# Patient Record
Sex: Male | Born: 1955 | Race: White | Hispanic: No | Smoking: Never smoker
Health system: Southern US, Academic
[De-identification: ages and names within clinical notes are randomized; demographics above are authoritative.]

## PROBLEM LIST (undated history)

## (undated) DIAGNOSIS — E785 Hyperlipidemia, unspecified: Secondary | ICD-10-CM

## (undated) DIAGNOSIS — E119 Type 2 diabetes mellitus without complications: Secondary | ICD-10-CM

## (undated) DIAGNOSIS — Z6825 Body mass index (BMI) 25.0-25.9, adult: Secondary | ICD-10-CM

## (undated) DIAGNOSIS — M539 Dorsopathy, unspecified: Secondary | ICD-10-CM

## (undated) DIAGNOSIS — E7800 Pure hypercholesterolemia, unspecified: Secondary | ICD-10-CM

## (undated) DIAGNOSIS — Z8669 Personal history of other diseases of the nervous system and sense organs: Secondary | ICD-10-CM

## (undated) DIAGNOSIS — Z6827 Body mass index (BMI) 27.0-27.9, adult: Secondary | ICD-10-CM

## (undated) DIAGNOSIS — G629 Polyneuropathy, unspecified: Secondary | ICD-10-CM

## (undated) DIAGNOSIS — R200 Anesthesia of skin: Secondary | ICD-10-CM

## (undated) DIAGNOSIS — I739 Peripheral vascular disease, unspecified: Secondary | ICD-10-CM

## (undated) DIAGNOSIS — G8929 Other chronic pain: Secondary | ICD-10-CM

## (undated) DIAGNOSIS — K219 Gastro-esophageal reflux disease without esophagitis: Secondary | ICD-10-CM

## (undated) DIAGNOSIS — T148XXA Other injury of unspecified body region, initial encounter: Secondary | ICD-10-CM

## (undated) DIAGNOSIS — I2510 Atherosclerotic heart disease of native coronary artery without angina pectoris: Secondary | ICD-10-CM

## (undated) DIAGNOSIS — Z973 Presence of spectacles and contact lenses: Secondary | ICD-10-CM

## (undated) DIAGNOSIS — M4803 Spinal stenosis, cervicothoracic region: Secondary | ICD-10-CM

## (undated) DIAGNOSIS — I10 Essential (primary) hypertension: Secondary | ICD-10-CM

## (undated) HISTORY — PX: HX KNEE SURGERY: 2100001320

## (undated) HISTORY — PX: HX TONSILLECTOMY: SHX27

## (undated) HISTORY — PX: COLONOSCOPY: SHX174

## (undated) HISTORY — PX: TOE AMPUTATION: SHX809

## (undated) HISTORY — PX: HX OTHER: 2100001105

## (undated) HISTORY — PX: HX HEART CATHETERIZATION: SHX148

## (undated) HISTORY — PX: HX BACK SURGERY: SHX140

## (undated) HISTORY — DX: Peripheral vascular disease, unspecified: I73.9

## (undated) HISTORY — PX: HX UPPER ENDOSCOPY: 2100001144

---

## 1995-12-08 ENCOUNTER — Emergency Department (HOSPITAL_COMMUNITY): Payer: Self-pay

## 2020-09-06 IMAGING — CR XRAY SINUSES COMPLETE
1 series · 4 of 4 positions shown · non-contrast
Comparison: None available.

﻿EXAM:  45115 - X-RAY EXAM OF SINUSES
INDICATION: Chronic sinusitis.

[Series 1: view not recorded · 0.17mm/px · 4 of 4 slices shown]
[im 1/4]
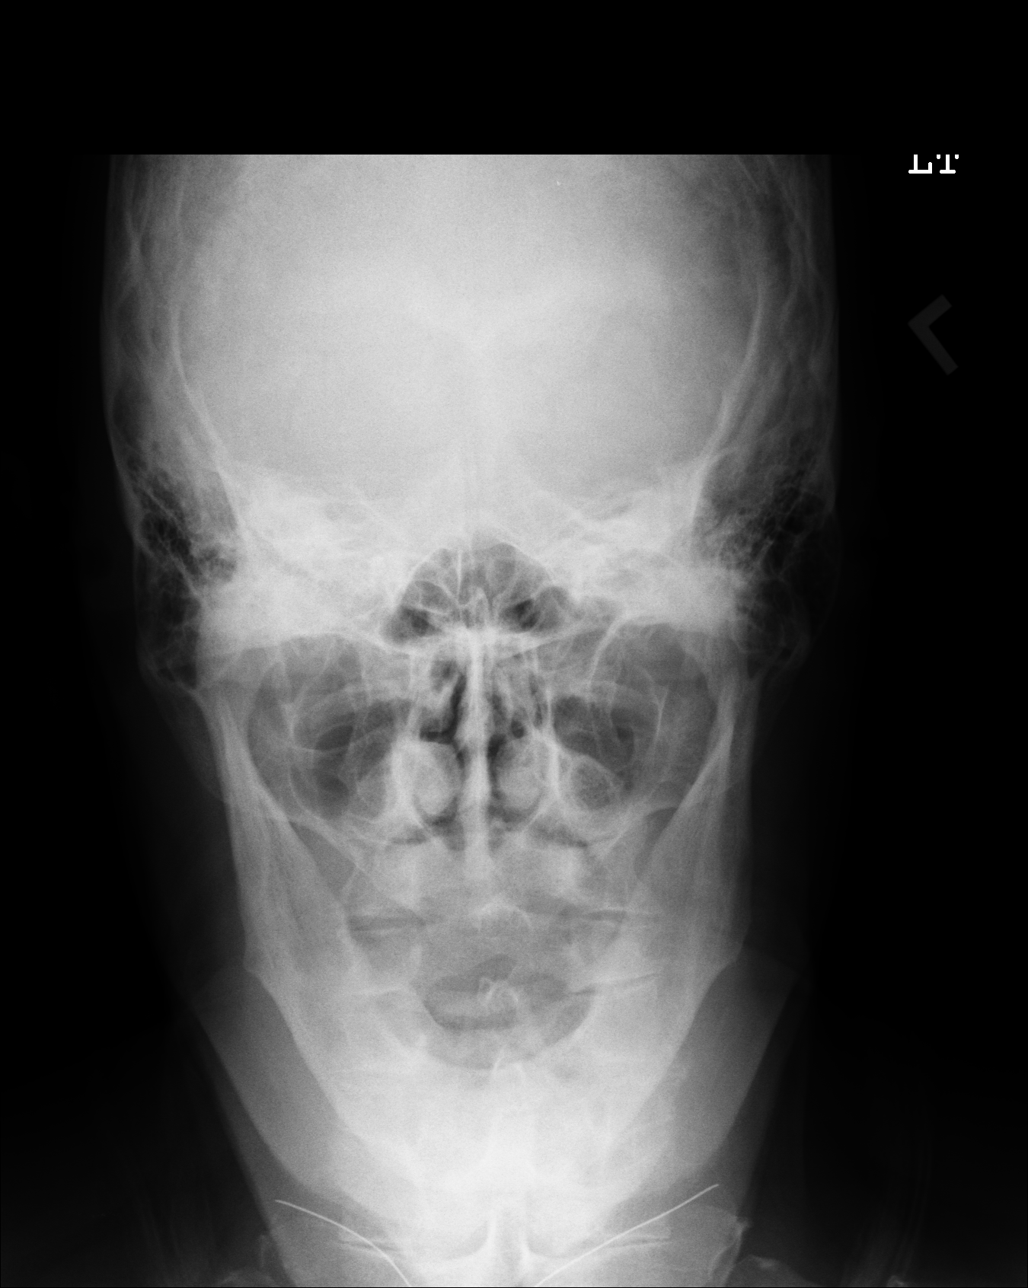
[im 2/4]
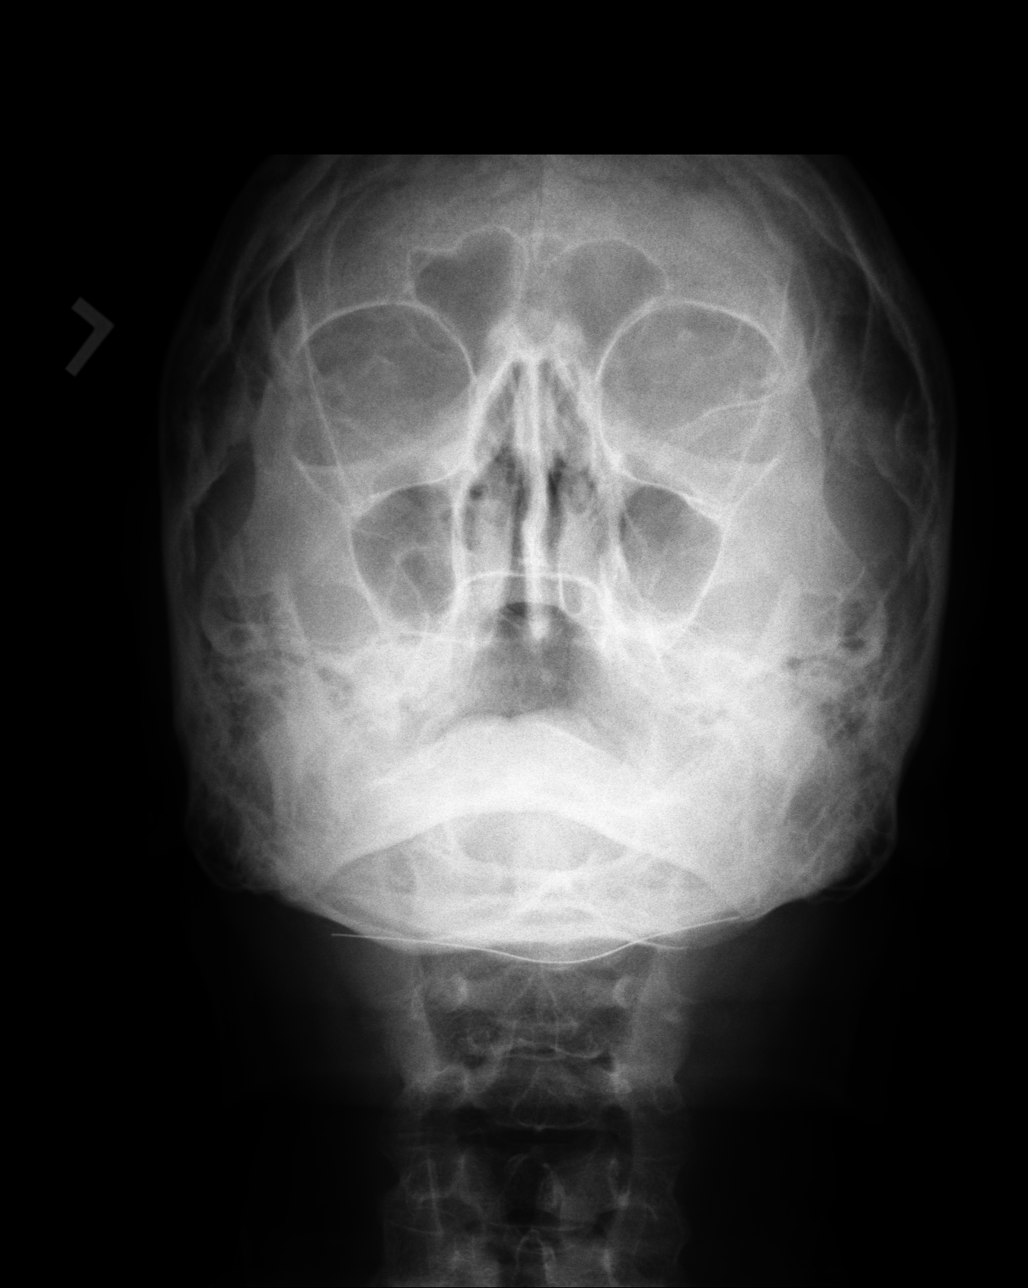
[im 3/4]
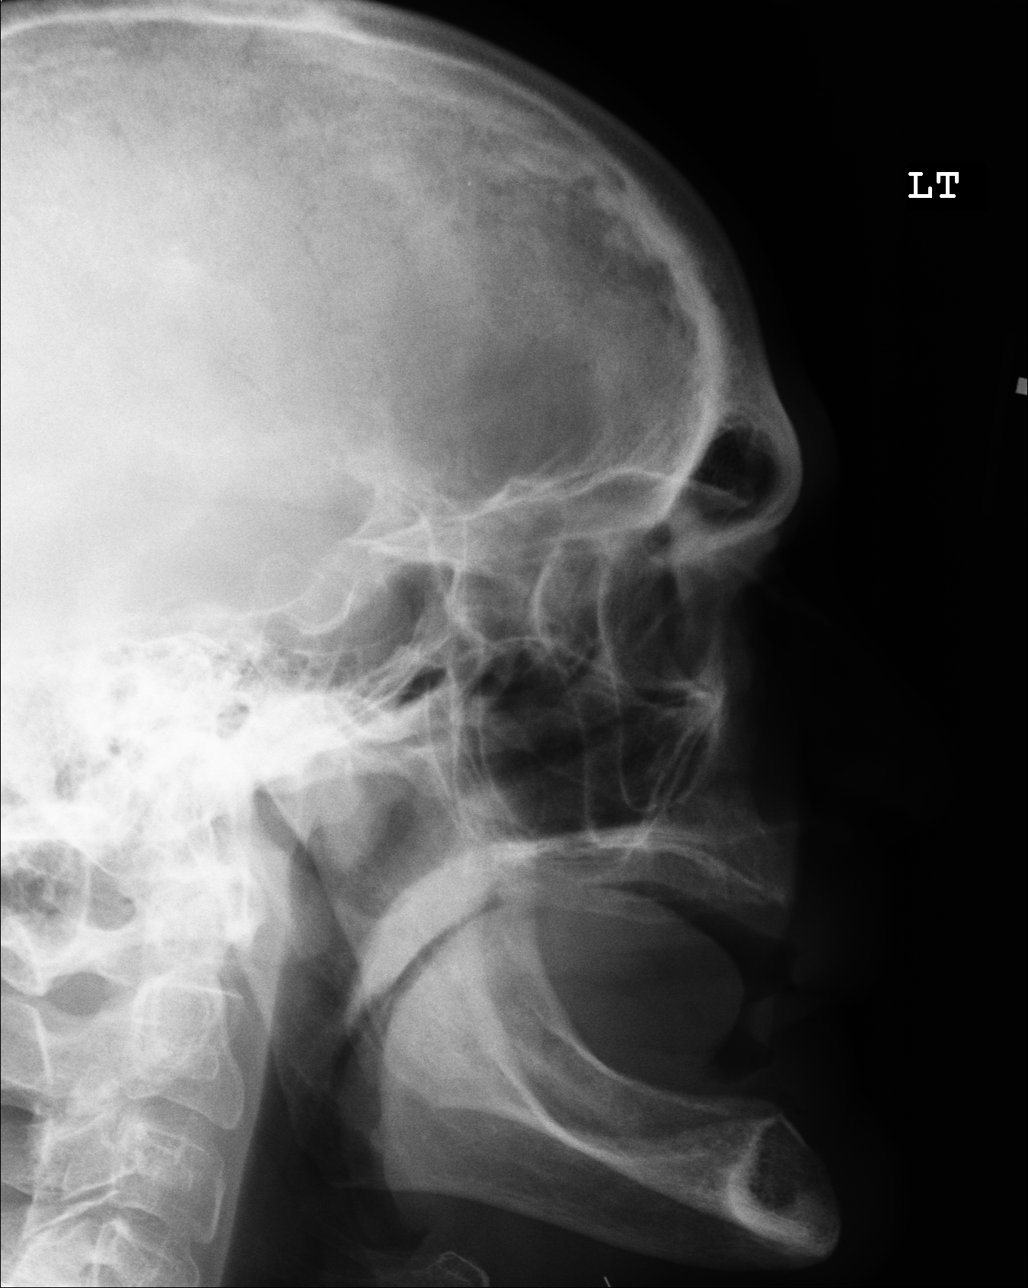
[im 4/4]
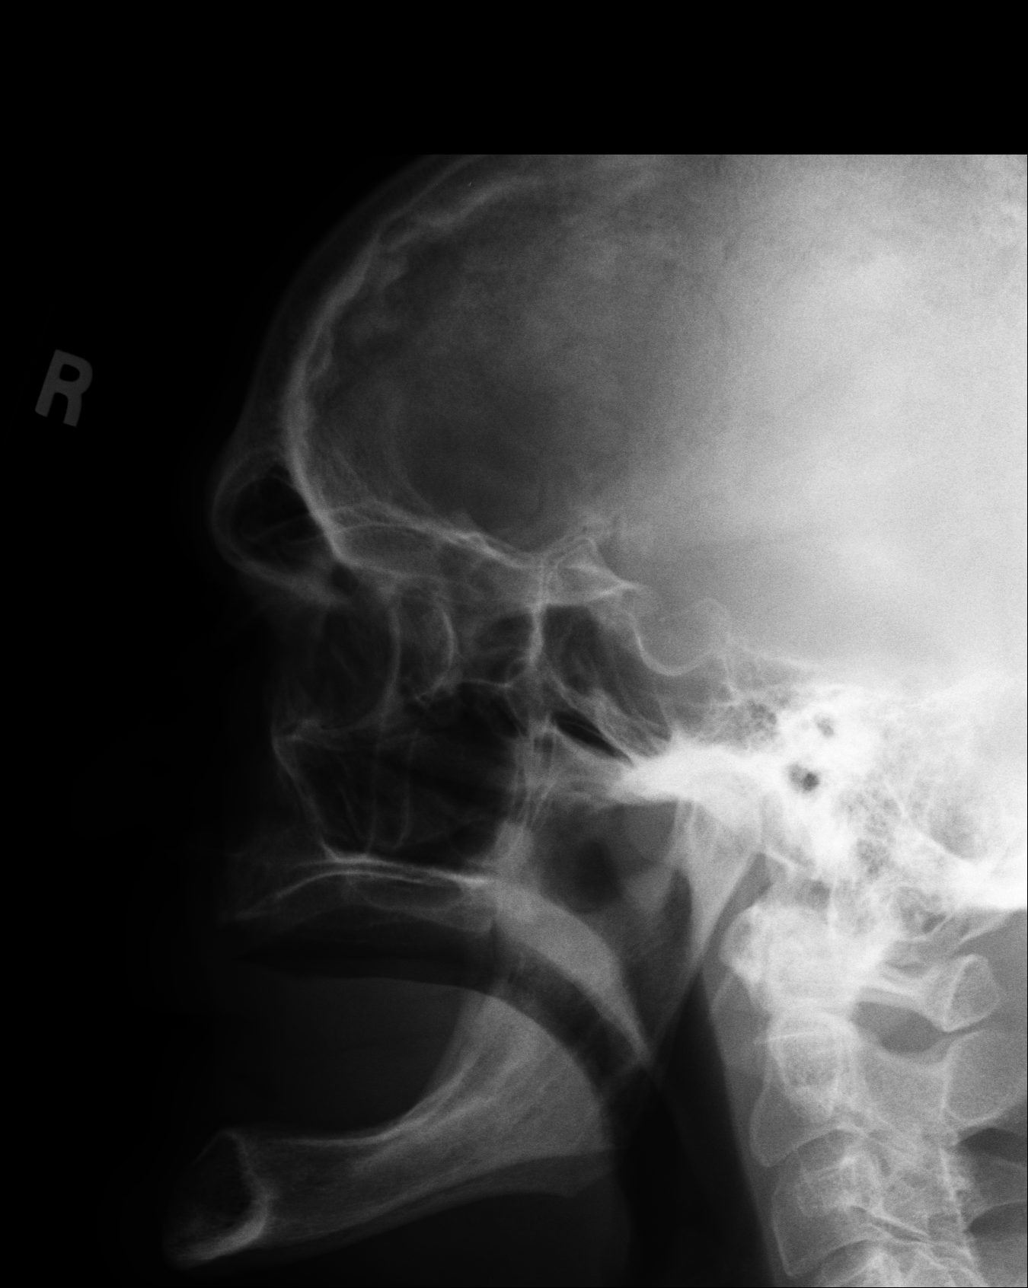

[4 of 4 positions shown; findings below may reference images not displayed]

FINDINGS: Frontal, ethmoid and maxillary sinuses are well aerated. Nasal septum is midline.
IMPRESSION: Unremarkable exam.

## 2020-10-05 IMAGING — CR XRAY LUMBAR SPINE [DATE] VIEWS
1 series · 3 of 3 positions shown · non-contrast
Comparison: None.

﻿EXAM:  10766   XRAY LUMBAR SPINE [DATE] VIEWS
INDICATION: Right-sided low back pain.

[Series 1: view not recorded · 0.17mm/px · 3 of 3 slices shown]
[im 1/3]
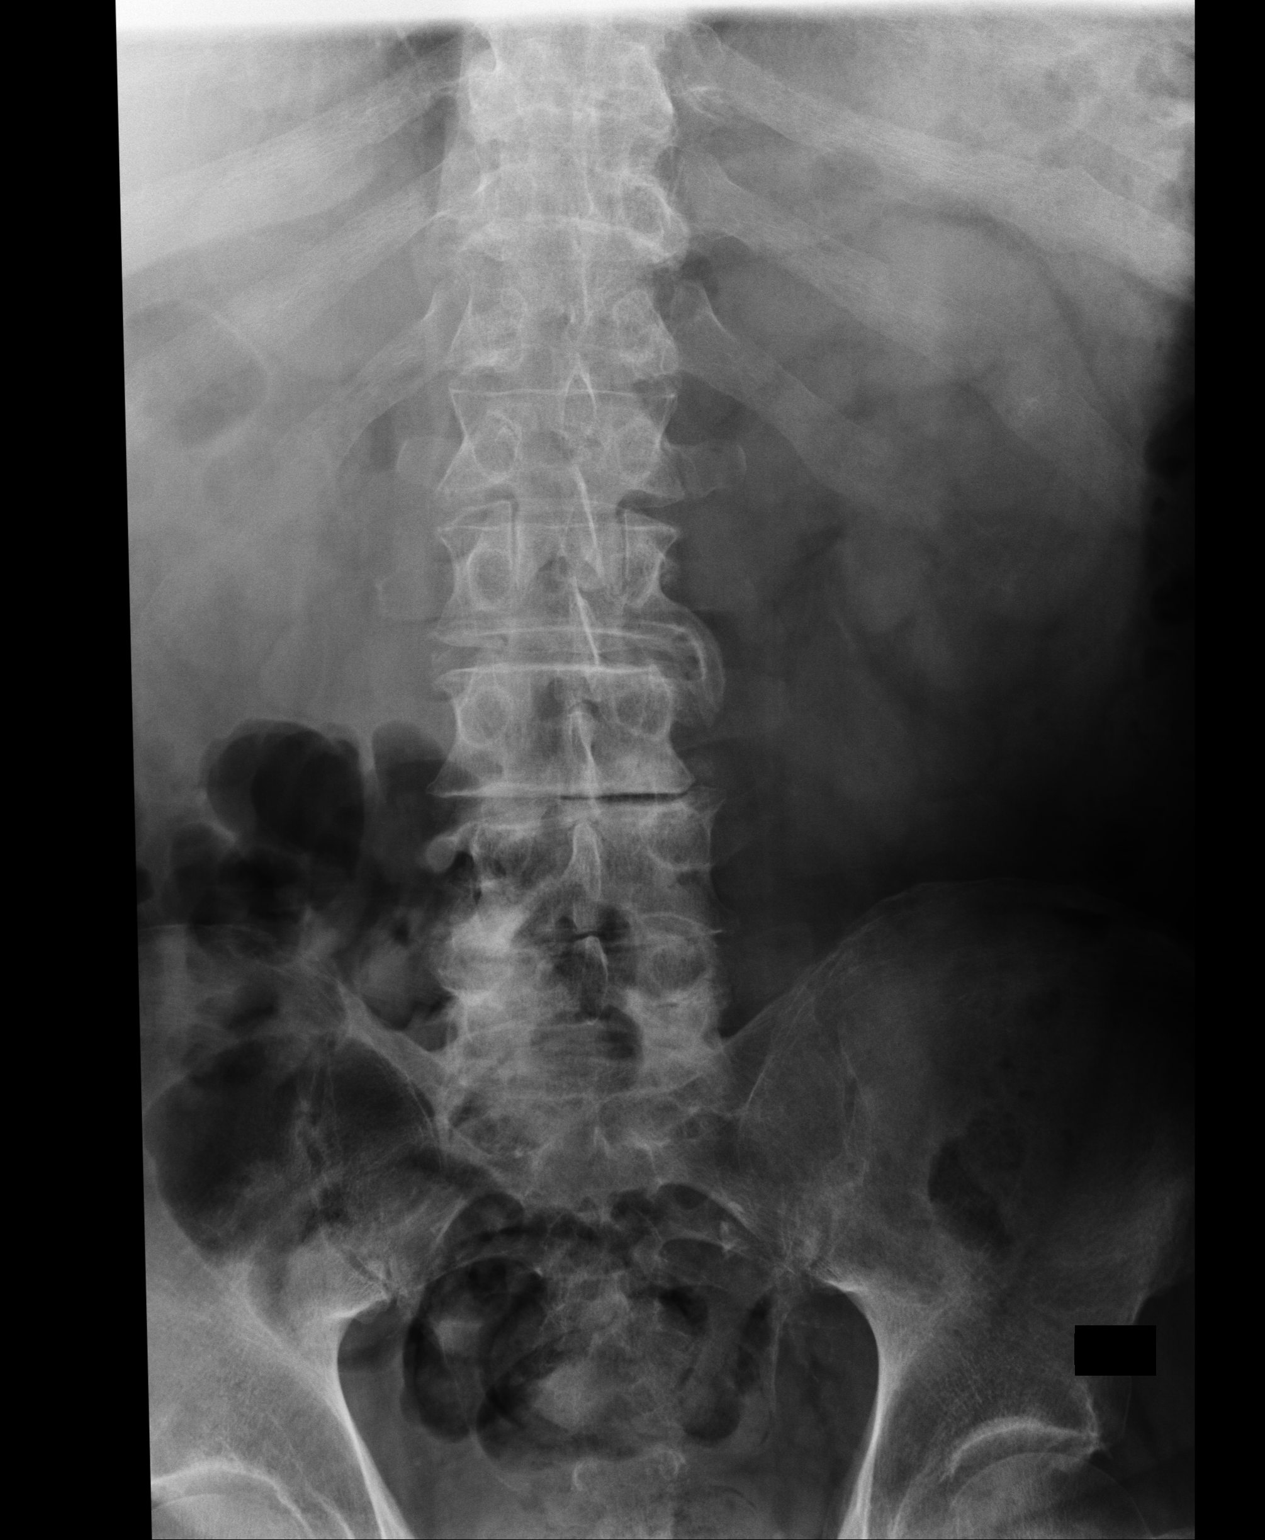
[im 2/3]
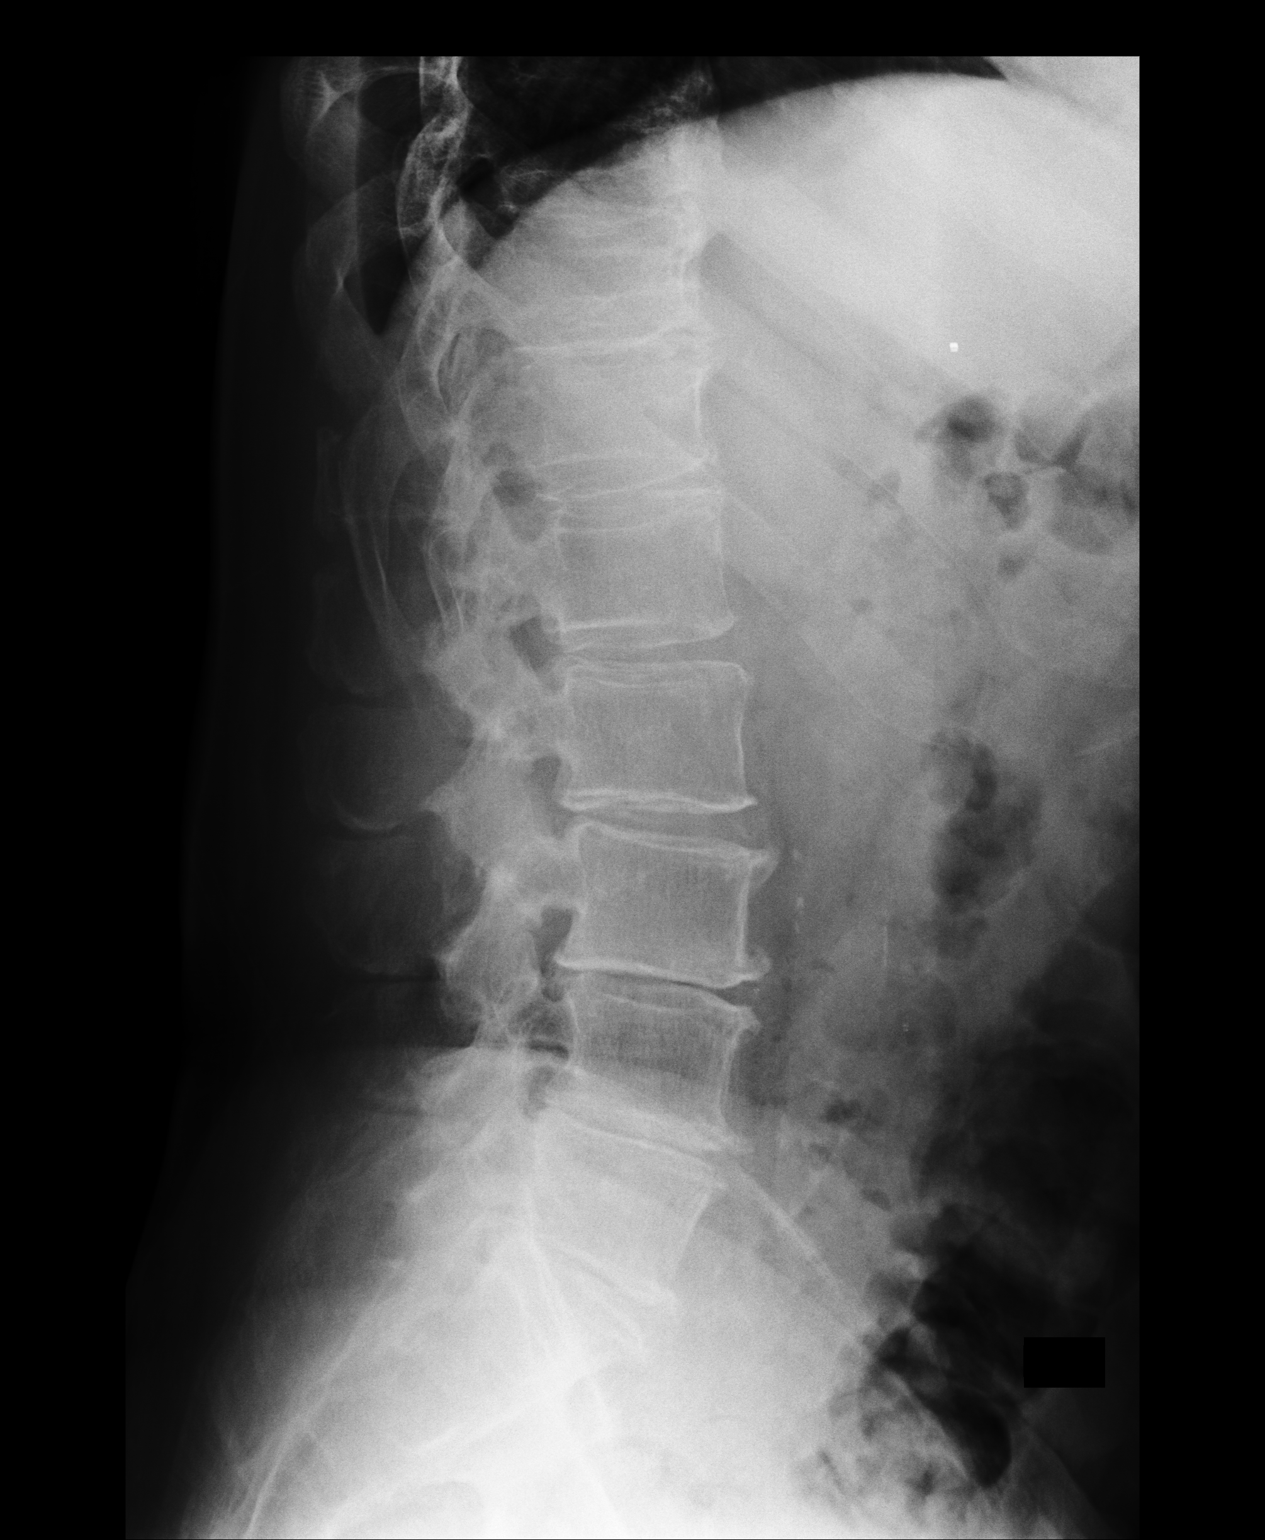
[im 3/3]
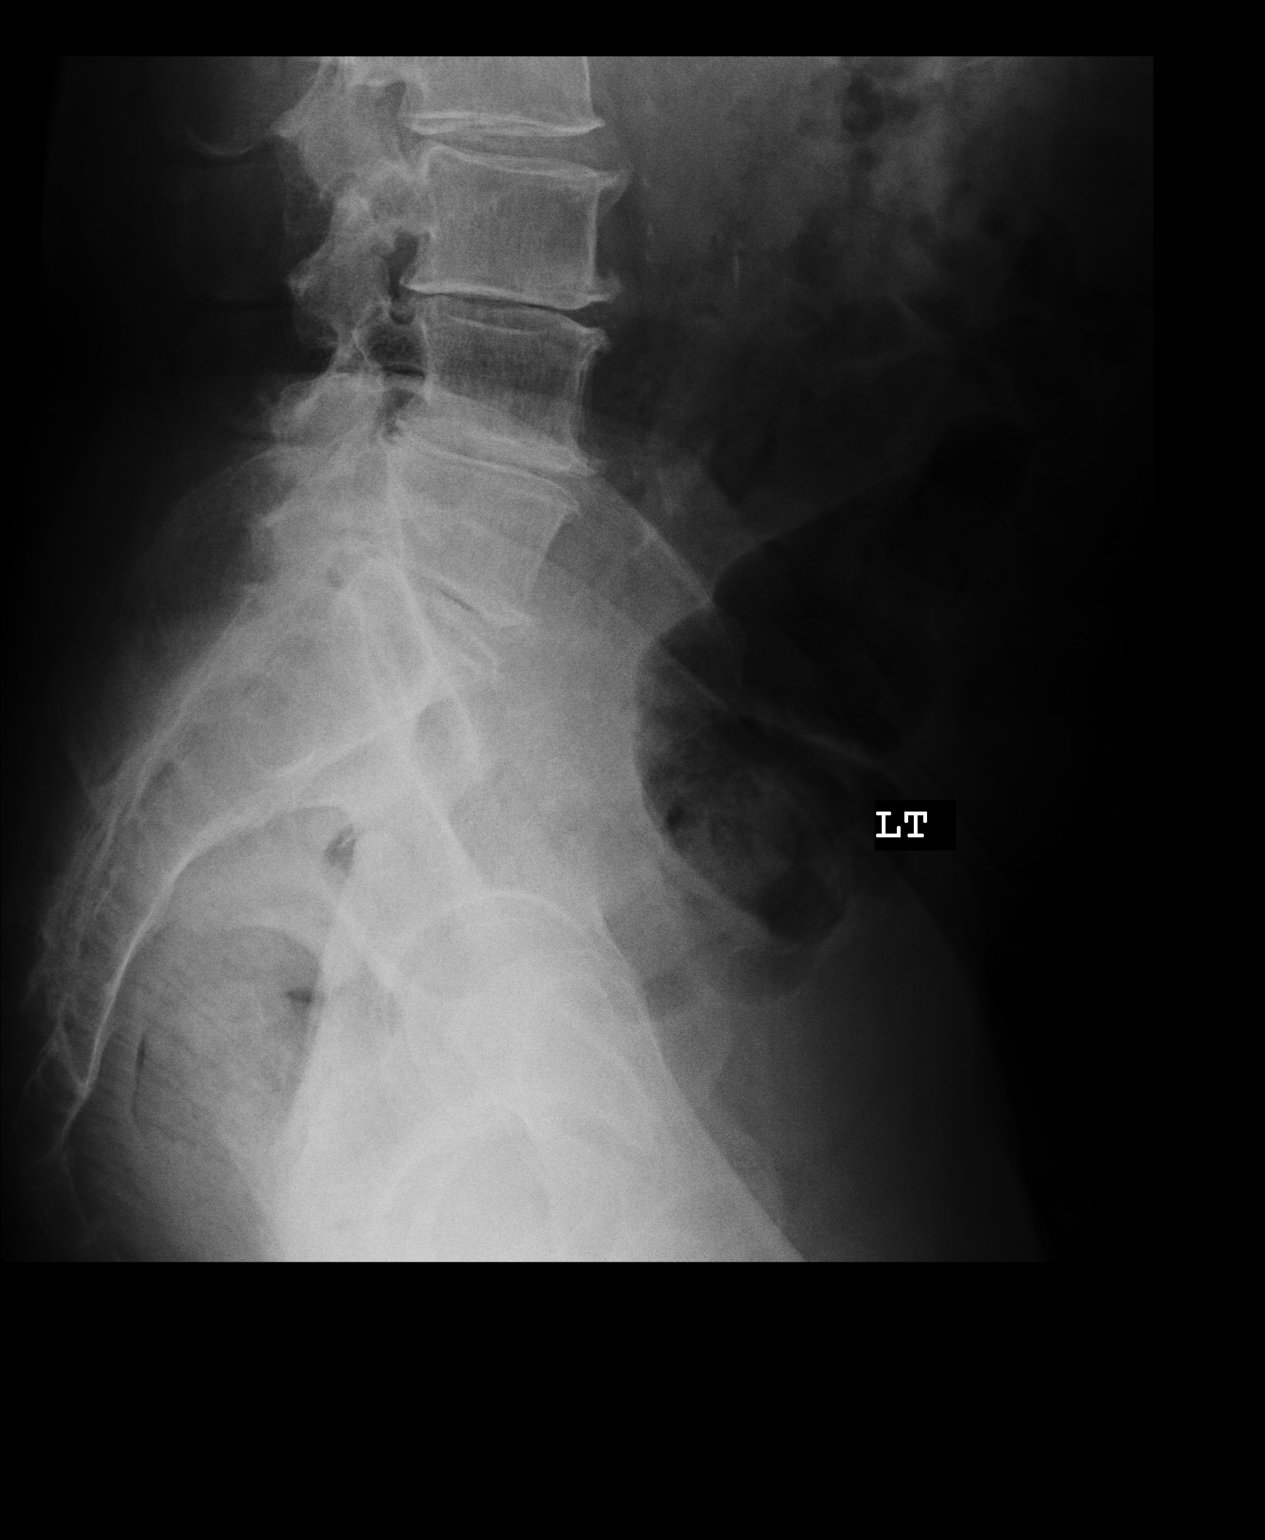

[3 of 3 positions shown; findings below may reference images not displayed]

FINDINGS: Three views demonstrate moderately severe degenerative changes at multiple levels with disc space narrowing, osteophyte formation, endplate sclerosis, and vacuum disc phenomena.  There is no fracture, malalignment, or lytic or blastic bony lesion.  Vascular calcification is noted.
IMPRESSION: Moderately severe degenerative changes.

## 2021-01-24 IMAGING — CR XRAY THORACIC SPINE 2 VIEWS
1 series · 3 of 3 positions shown · non-contrast
Comparison: None available.

﻿EXAM:  01101      XRAY THORACIC SPINE 2 VIEWS
INDICATION: Back pain.

[Series 1: view not recorded · 0.17mm/px · 3 of 3 slices shown]
[im 1/3]
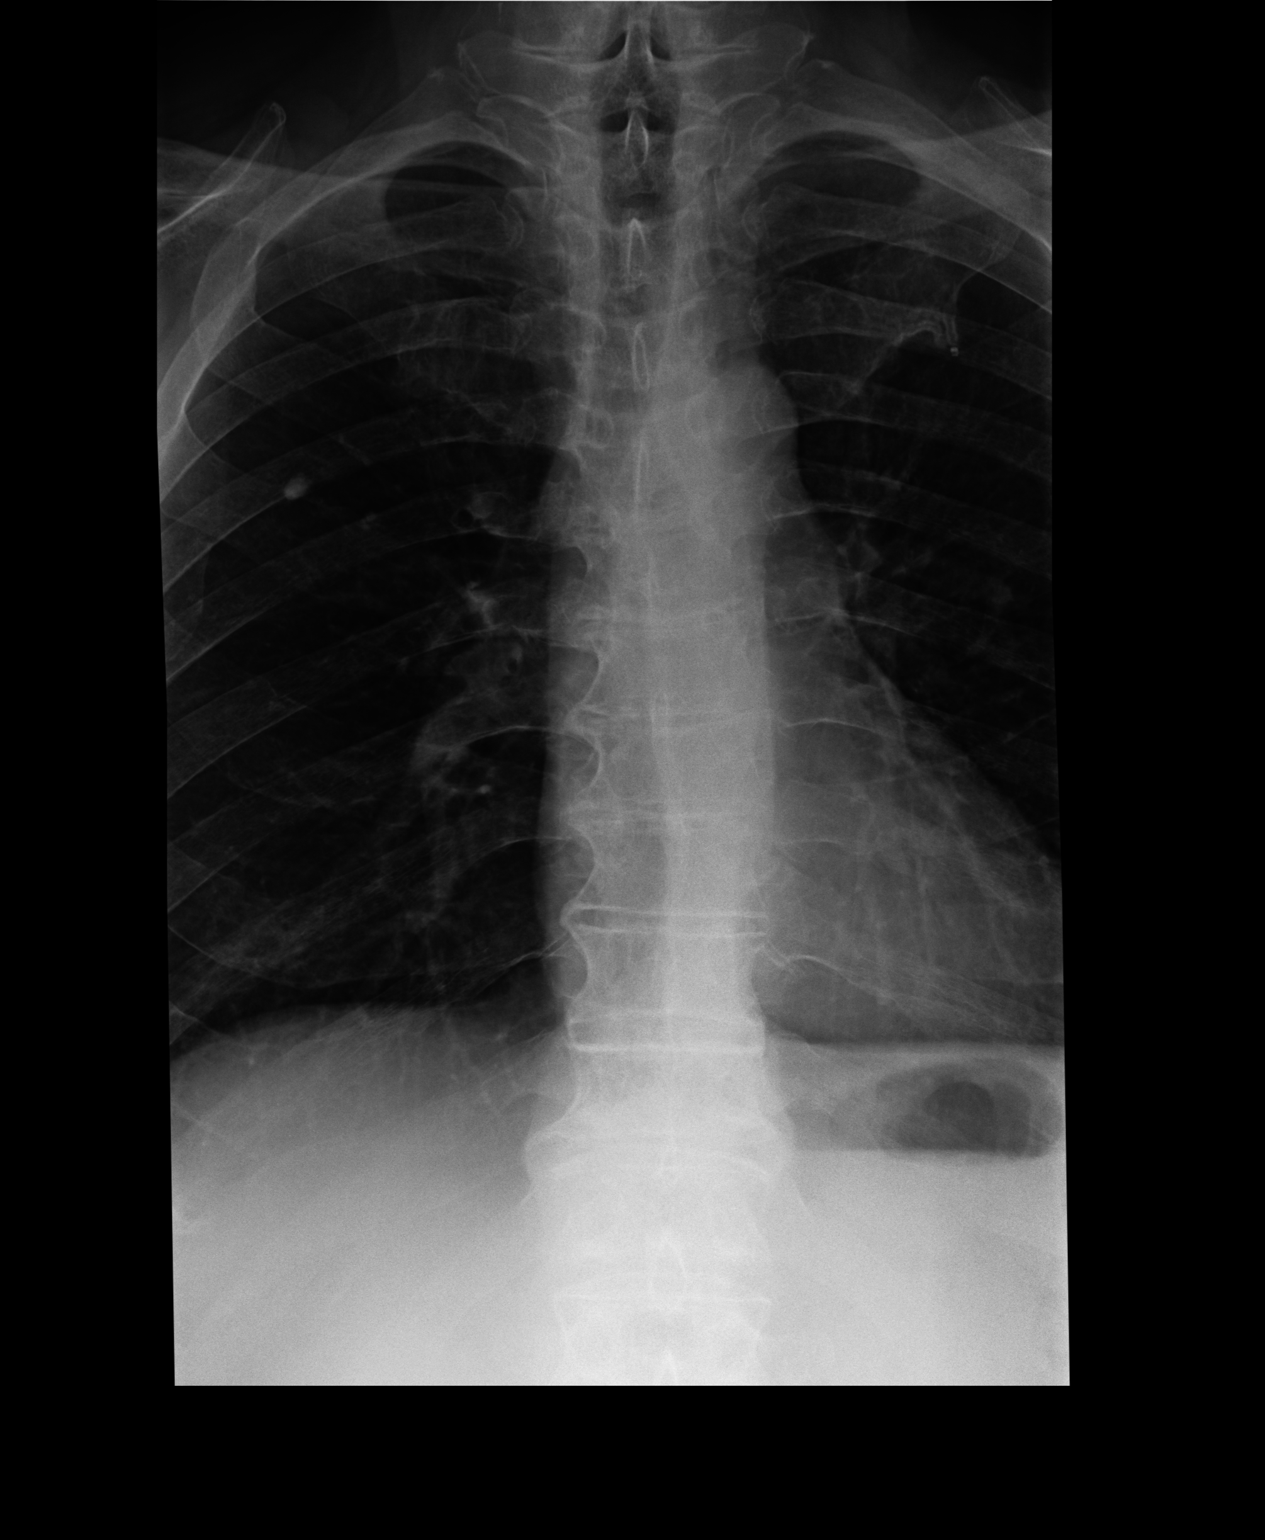
[im 2/3]
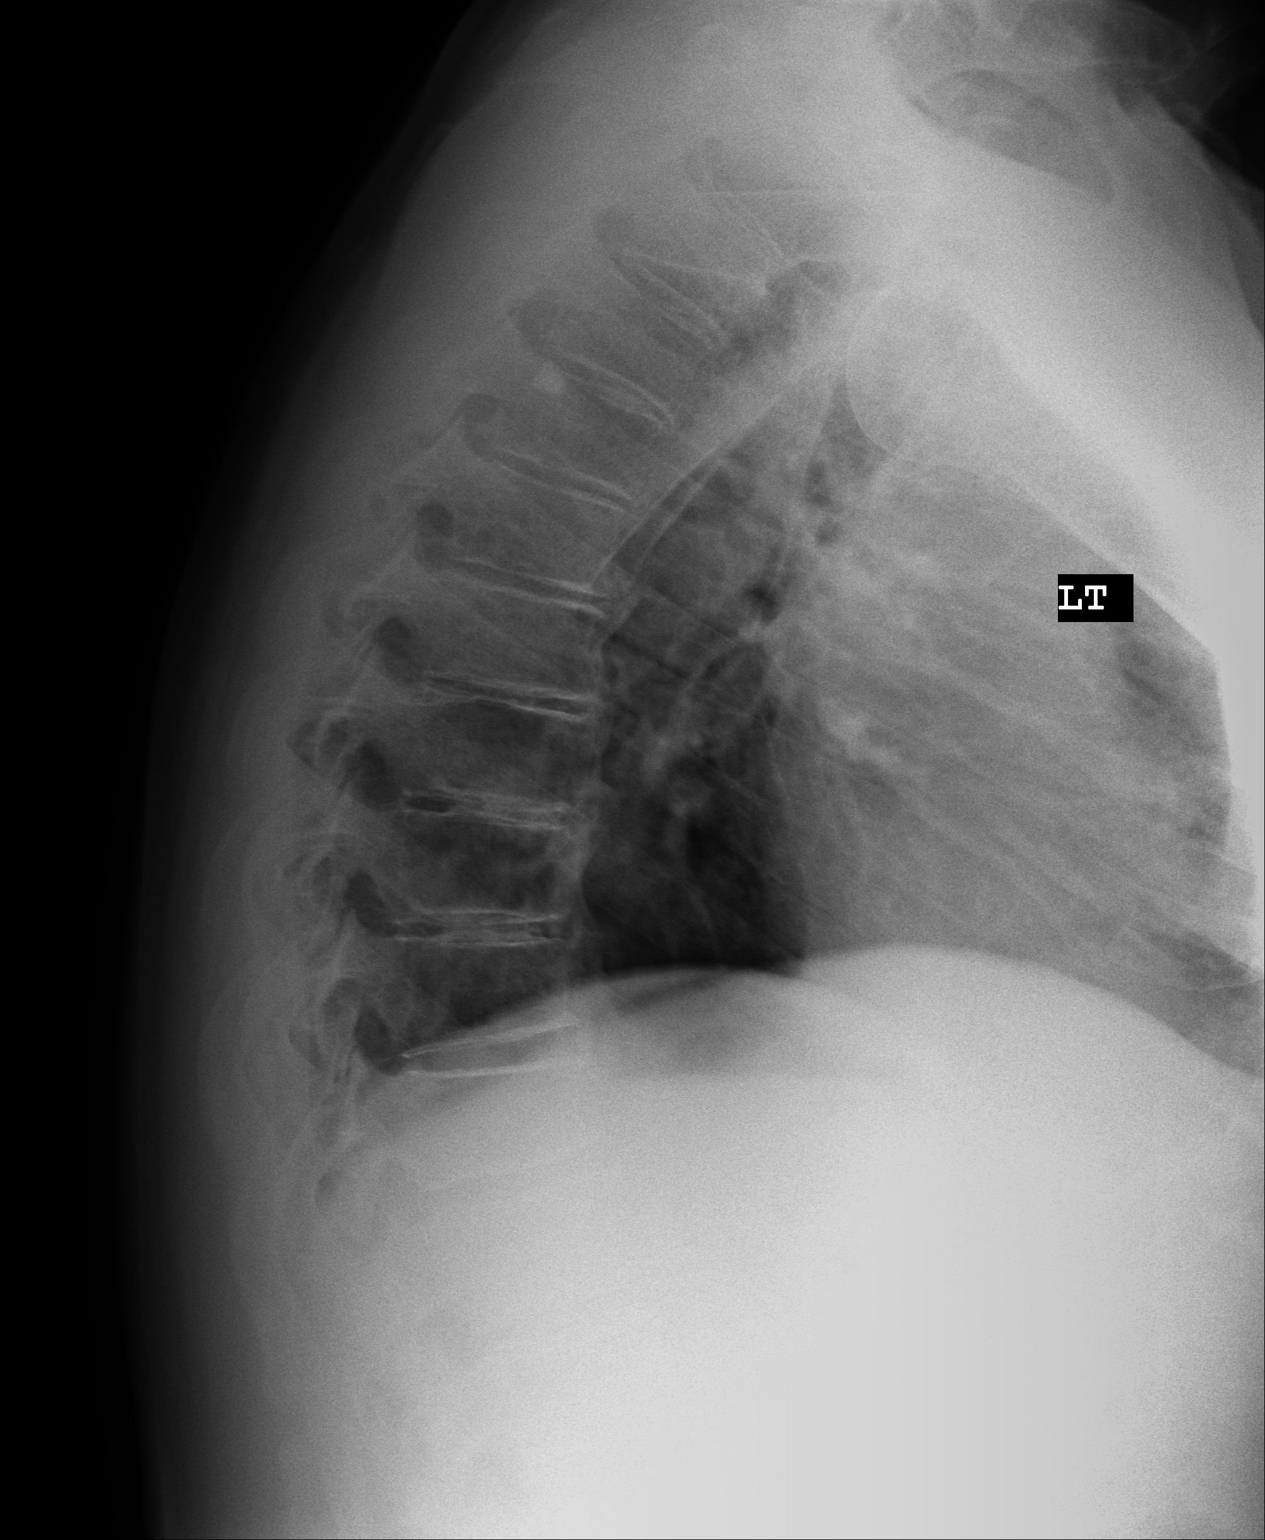
[im 3/3]
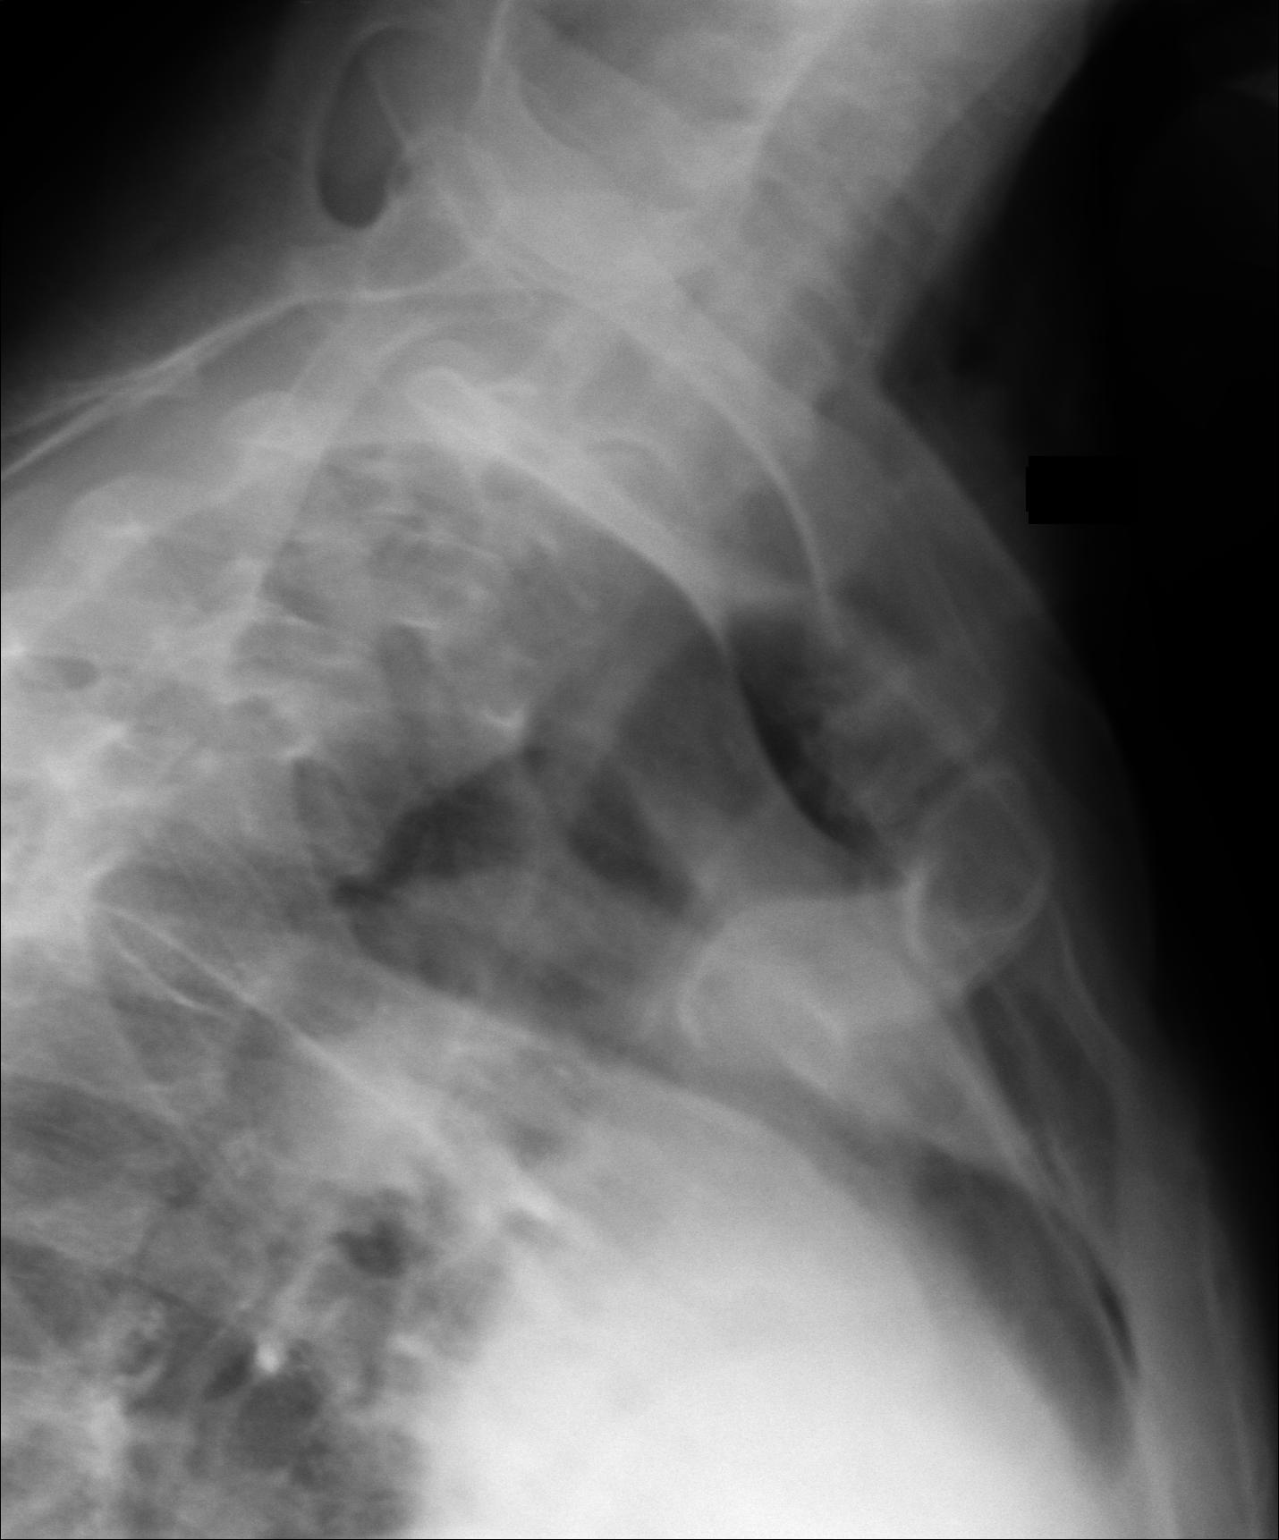

[3 of 3 positions shown; findings below may reference images not displayed]

FINDINGS: There is a mild chronic T11 superior endplate compression fracture. There is no acute fracture or subluxation. Mild degenerative disc disease seen at a few levels within the mid and lower thoracic spine. Paraspinal soft tissues are unremarkable.
IMPRESSION: Mild chronic T11 superior endplate compression fracture and multilevel degenerative changes.

## 2021-01-24 IMAGING — CR XRAY CERVICAL SPINE [DATE] VIEWS
1 series · 3 of 3 positions shown · non-contrast
Comparison: None available.

﻿EXAM:  [id number]      XRAY CERVICAL SPINE [DATE] VIEWS
INDICATION: Neck pain.

[Series 1: view not recorded · 0.17mm/px · 3 of 3 slices shown]
[im 1/3]
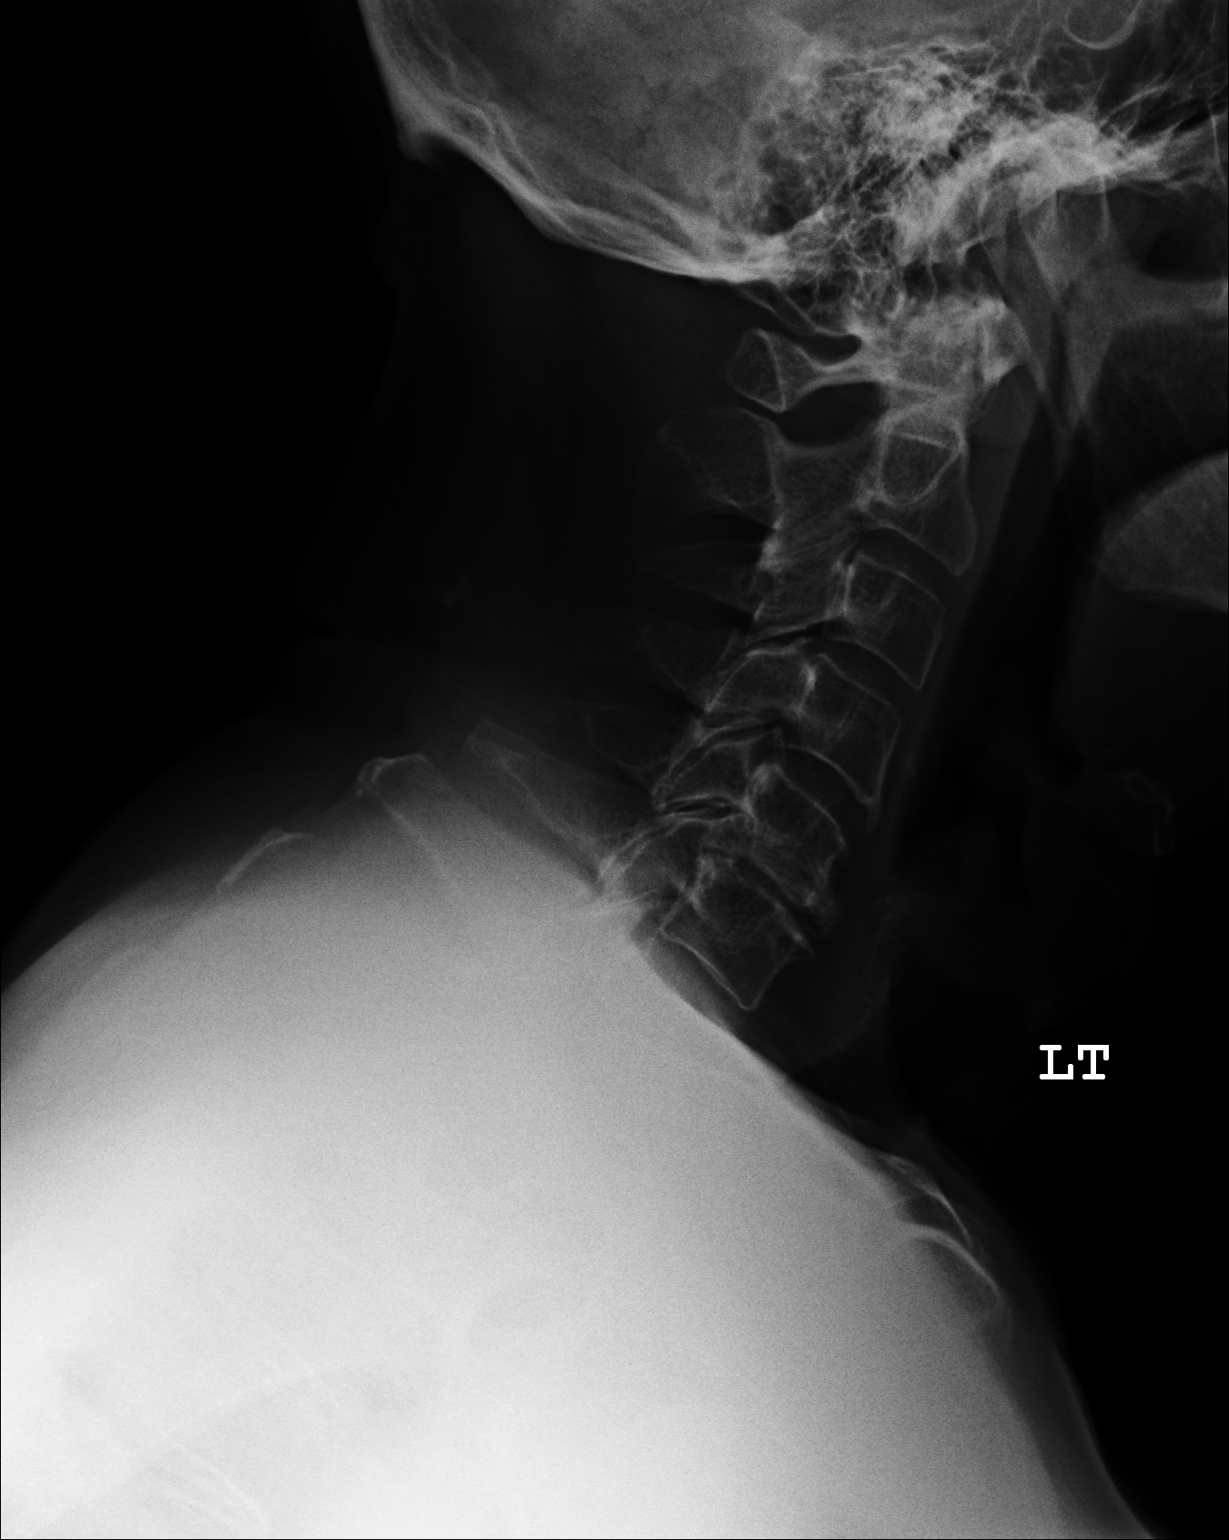
[im 2/3]
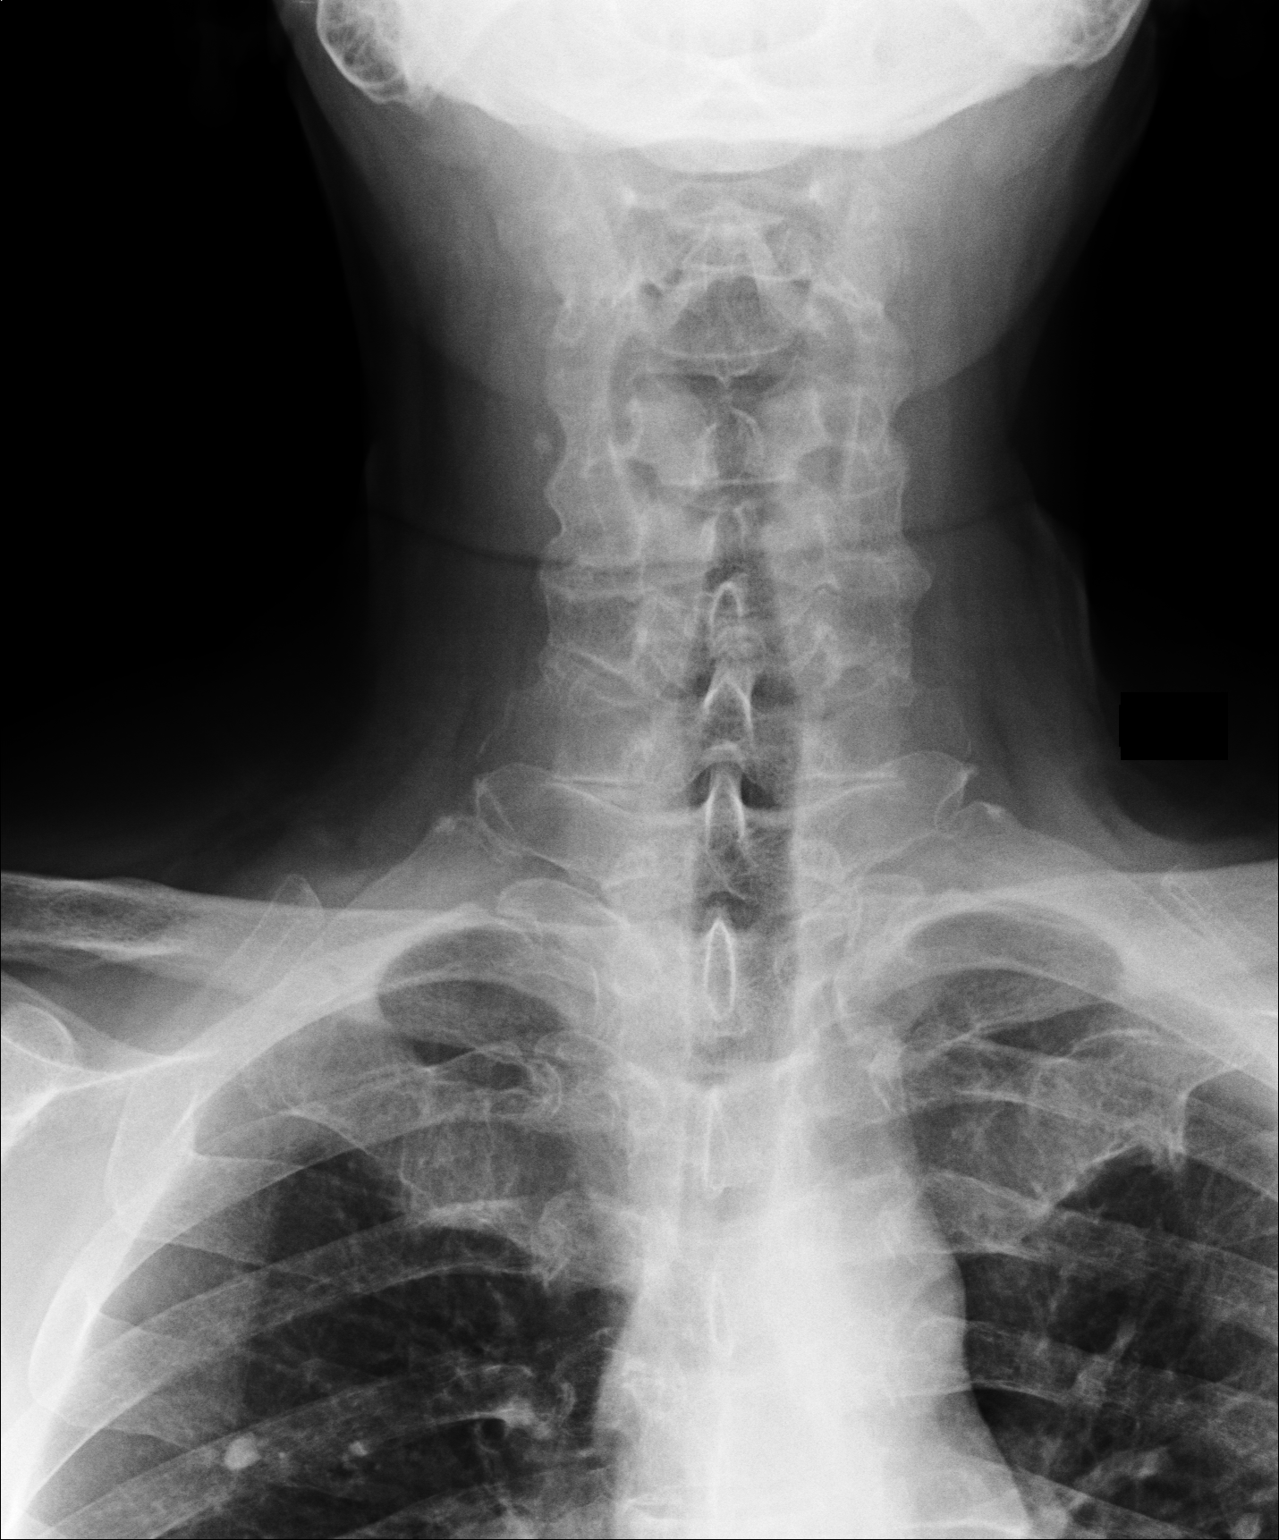
[im 3/3]
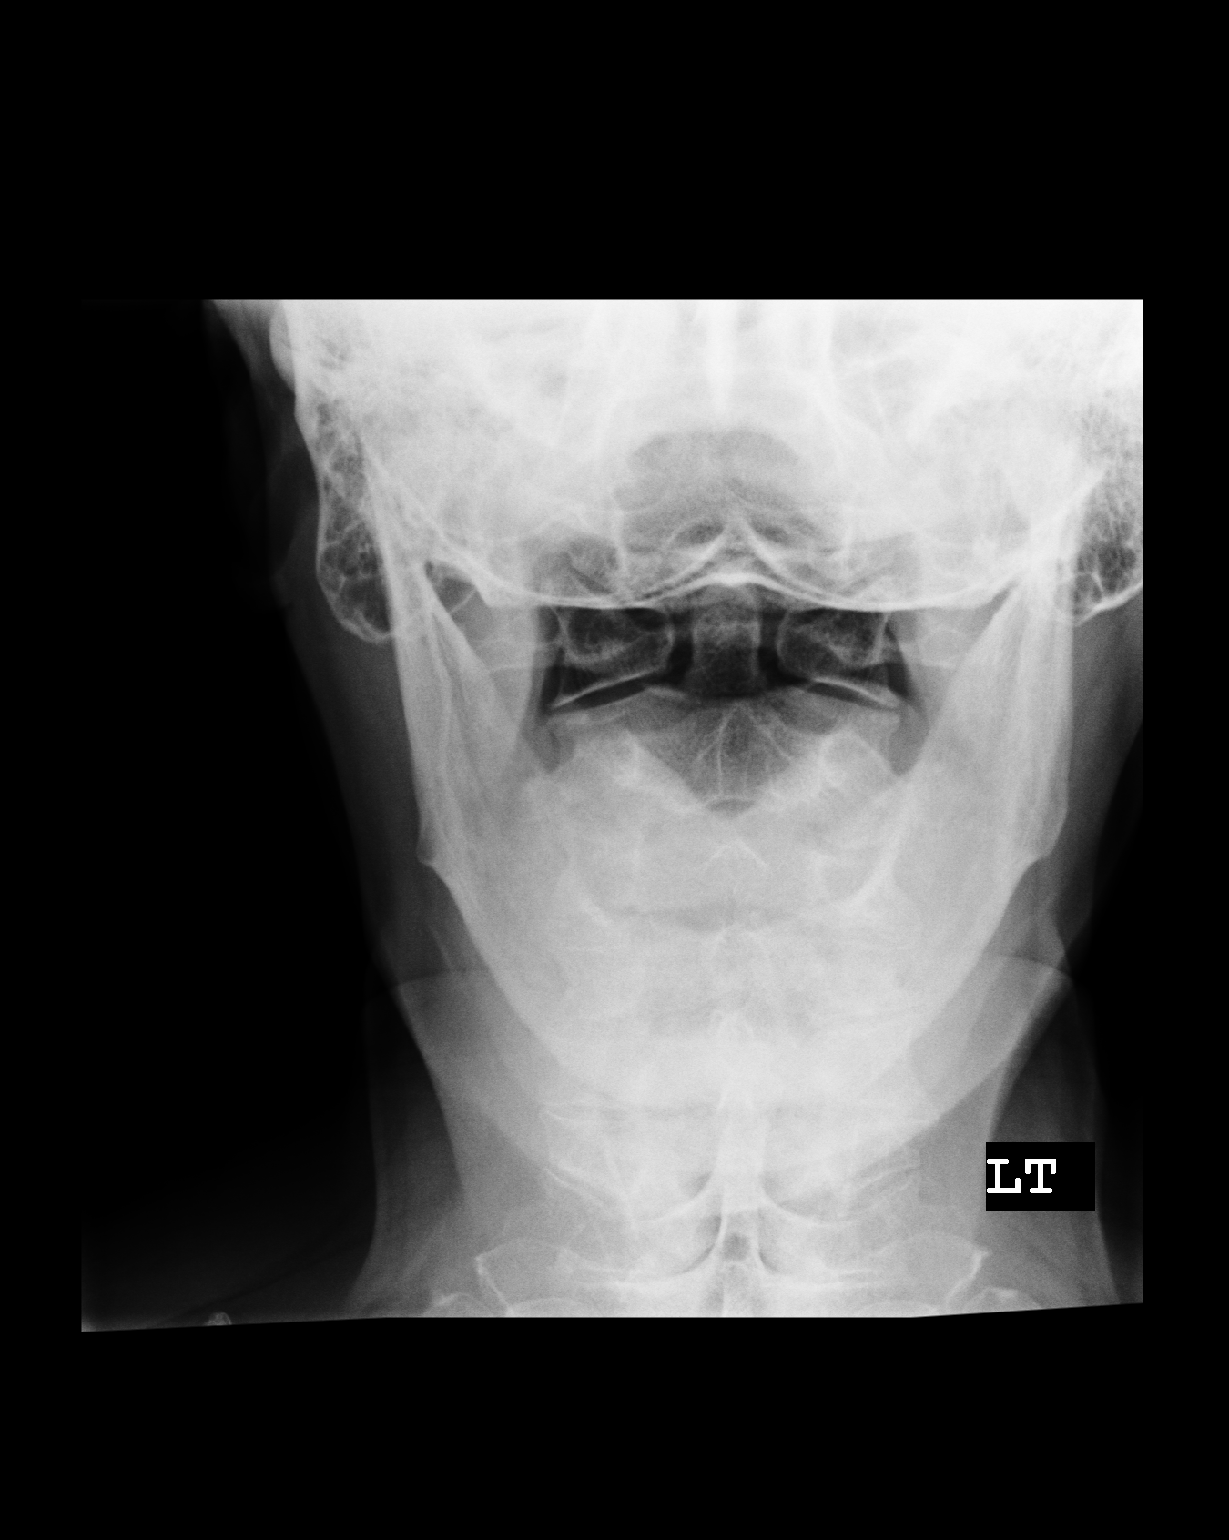

[3 of 3 positions shown; findings below may reference images not displayed]

FINDINGS: There is no acute fracture or subluxation. There is severe degenerative disc disease at C5-6 level. Atlantoaxial articulation is well maintained. There is no prevertebral soft tissue swelling.
IMPRESSION: Severe degenerative disc disease at C5-6 level.

## 2021-01-24 IMAGING — CR XRAY RIBS RT 2 VIEWS
1 series · 4 of 4 positions shown · non-contrast
Comparison: None available.

﻿EXAM:  CHEST XRAY 2 VIEWS,XRAY RIBS RT 2 VIEWS
INDICATION: Right-sided rib pain.

[Series 1: view not recorded · 0.17mm/px · 4 of 4 slices shown]
[im 1/4]
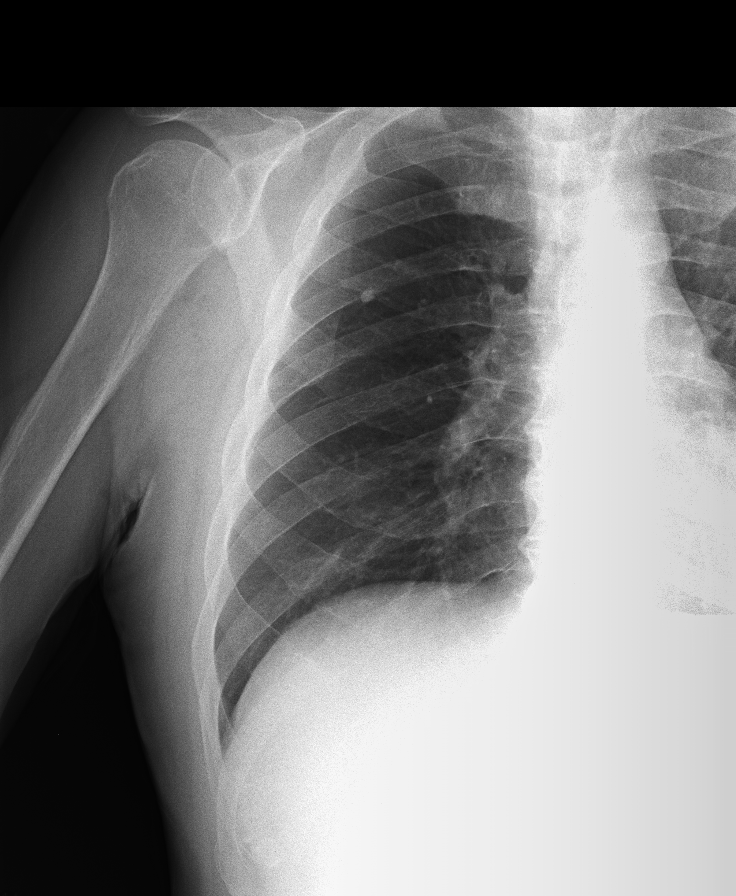
[im 2/4]
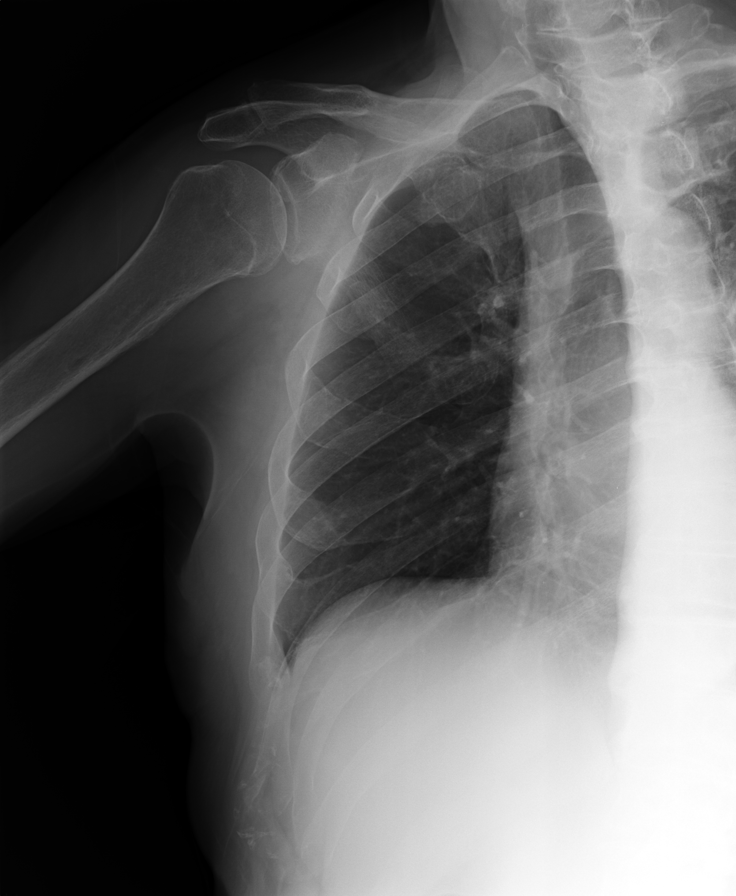
[im 3/4]
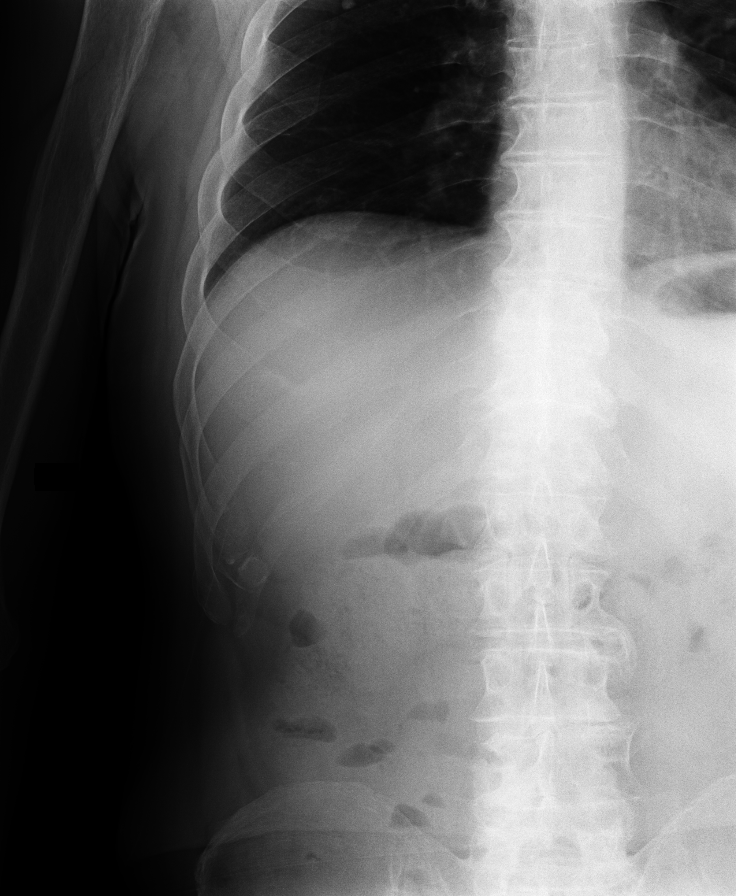
[im 4/4]
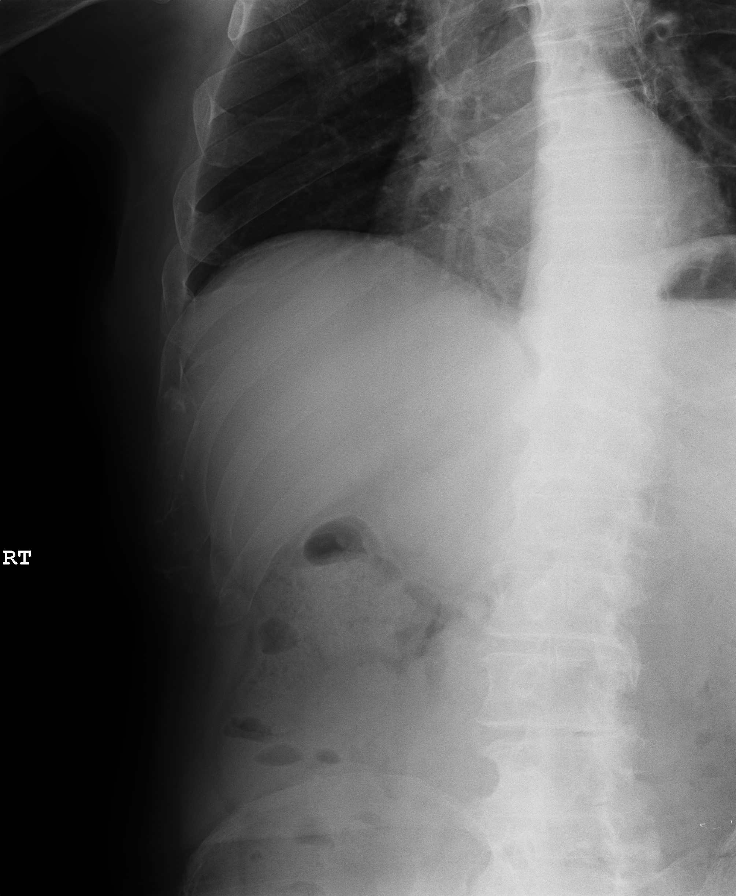

[4 of 4 positions shown; findings below may reference images not displayed]

FINDINGS: There is no consolidation or pleural effusion. Right upper lobe calcified granuloma is noted. There is no pneumothorax Cardiomediastinal silhouette is normal. No acute right-sided rib abnormality is identified. Visualized upper abdomen is unremarkable.
IMPRESSION: No acute cardiopulmonary abnormality.

## 2022-02-18 IMAGING — MR MRI CERVICAL SPINE WITHOUT CONTRAST
4 of 5 series · 24 of 48 positions shown · IV contrast (gadolinium)
Comparison: Radiographs dated 01/24/2021.

﻿EXAM:  14505   MRI CERVICAL SPINE WITHOUT CONTRAST
INDICATION: Neck pain with right upper extremity radiculopathy.
TECHNIQUE: Multiplanar multisequential MRI of the cervical spine was performed without gadolinium contrast.

[Series 5: T2 · sagittal · 3.0mm · 0.75mm/px · 8 of 13 slices shown (1 of 2)]
[im 1/13]
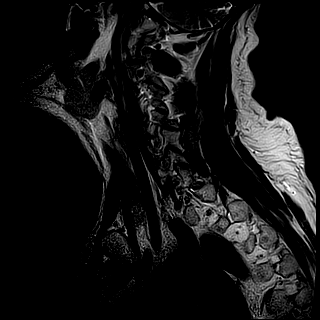
[im 2/13]
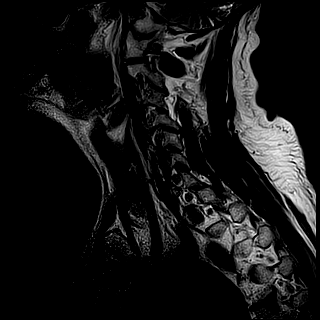
[im 4/13]
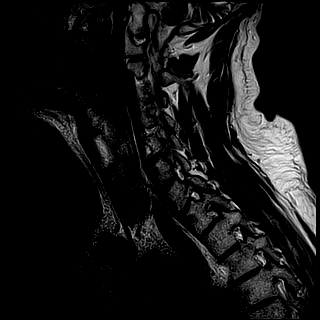
[im 6/13]
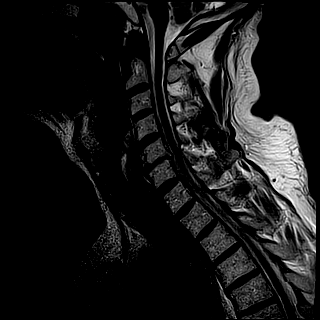
[im 7/13]
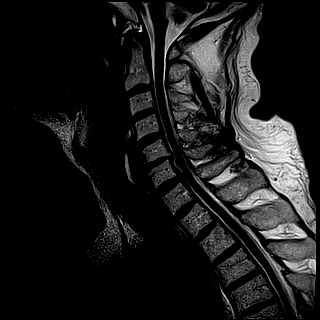
[im 9/13]
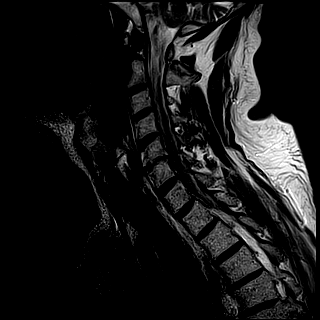
[im 11/13]
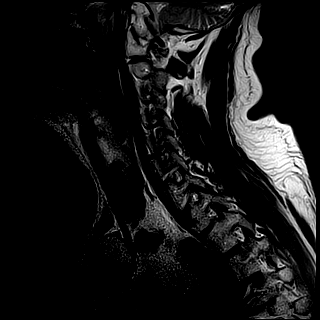
[im 13/13]
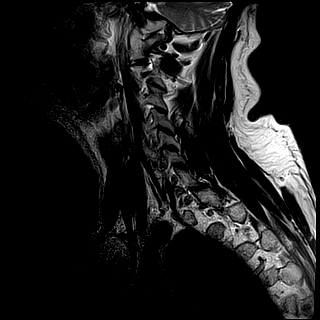

[Series 6: T1 · sagittal · 3.0mm · 0.47mm/px · 4 of 13 slices shown]
[im 1/13]
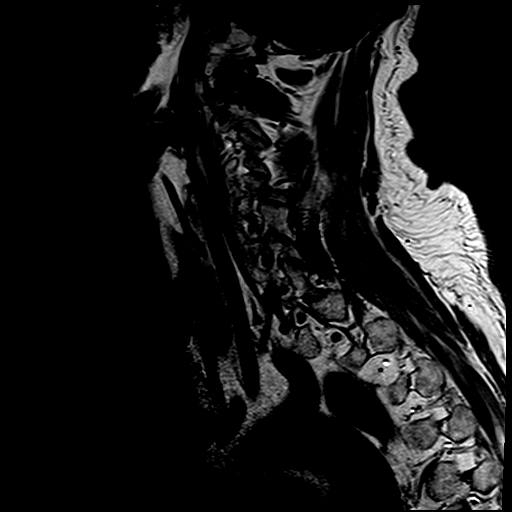
[im 2/13]
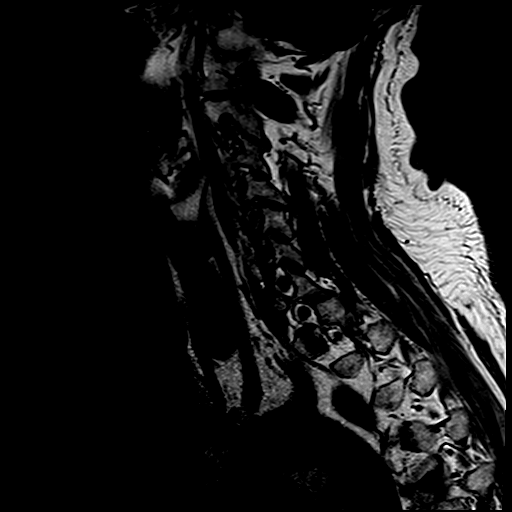
[im 7/13]
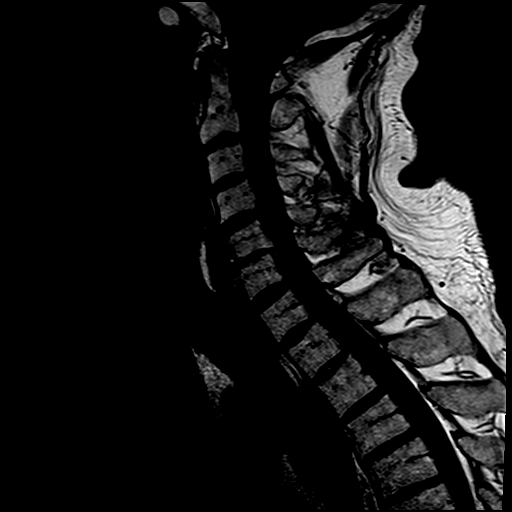
[im 11/13]
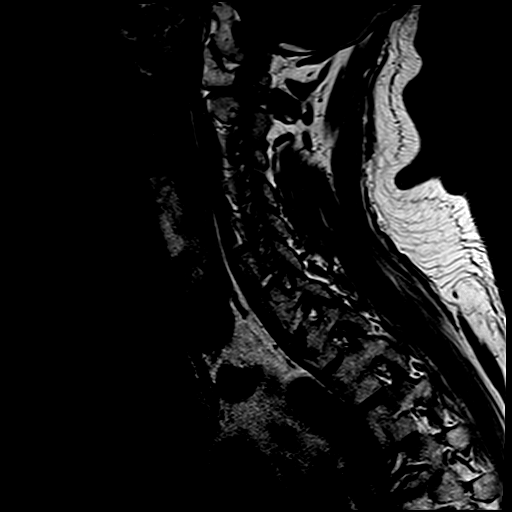

[Series 7: STIR · sagittal · 3.0mm · 0.47mm/px · 3 of 13 slices shown]
[im 2/13]
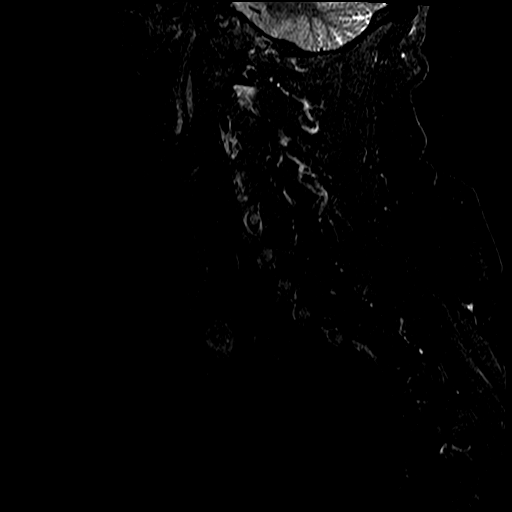
[im 7/13]
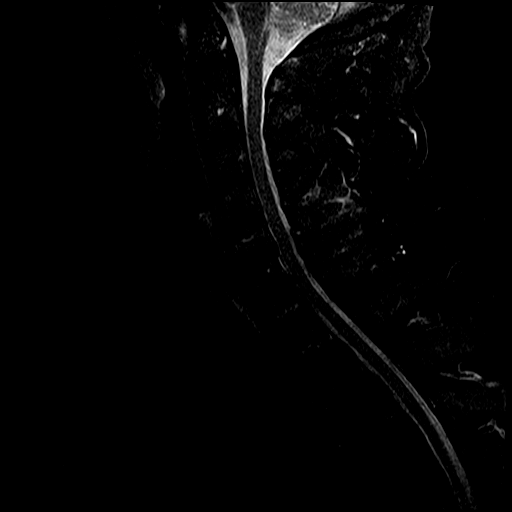
[im 11/13]
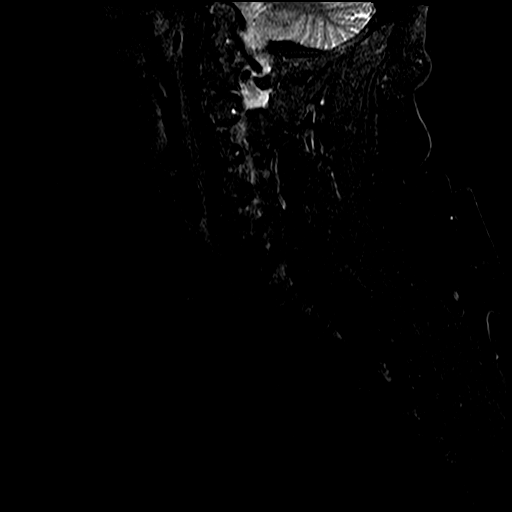

[Series 8: T2 · axial · 3.0mm · 0.39mm/px · z∈[-75,+49]mm · 9 of 18 slices shown (2 of 2)]
[im 1/18]
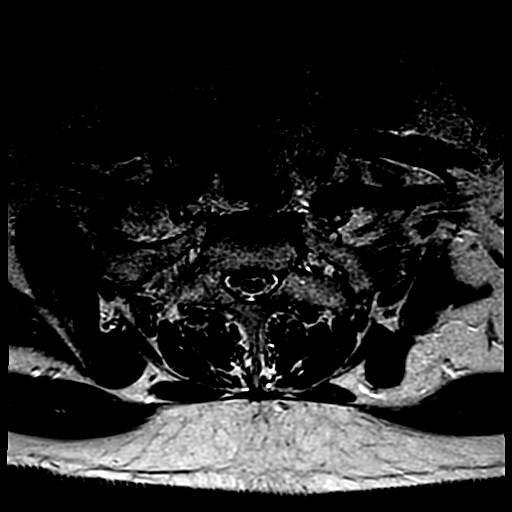
[im 4/18]
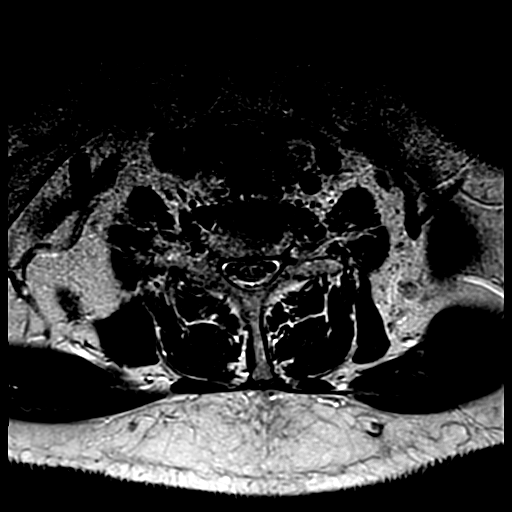
[im 5/18]
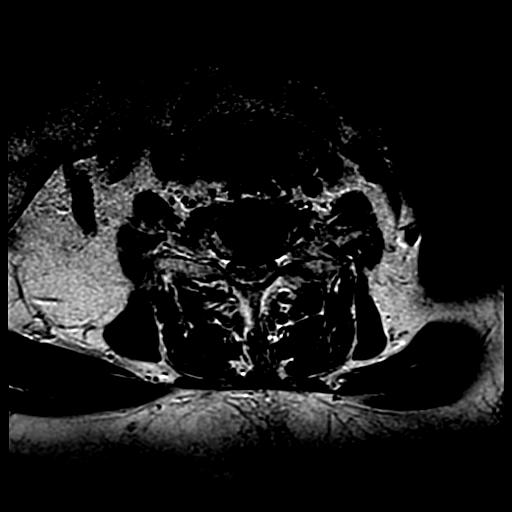
[im 8/18]
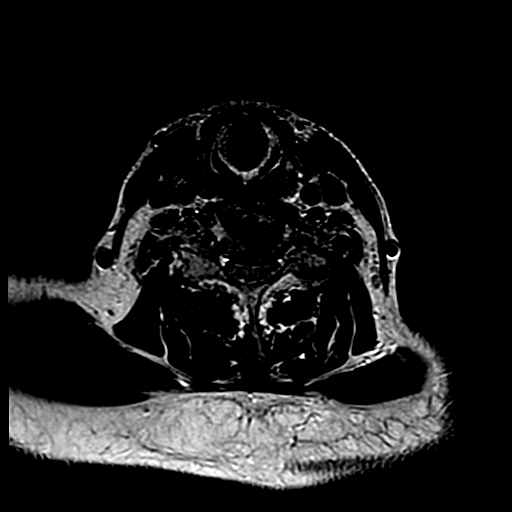
[im 10/18]
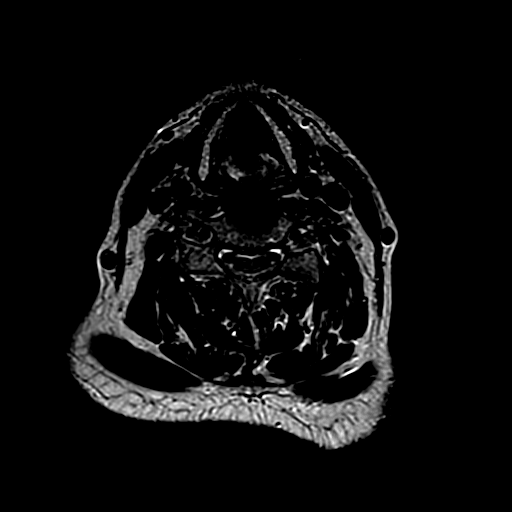
[im 13/18]
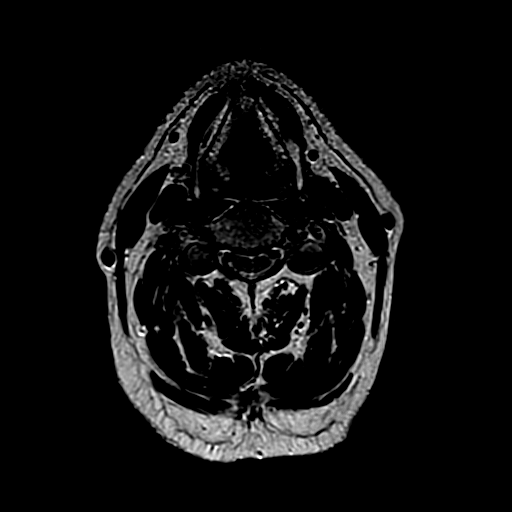
[im 14/18]
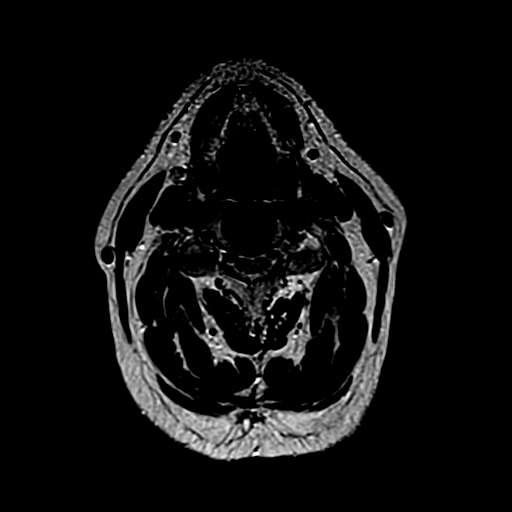
[im 16/18]
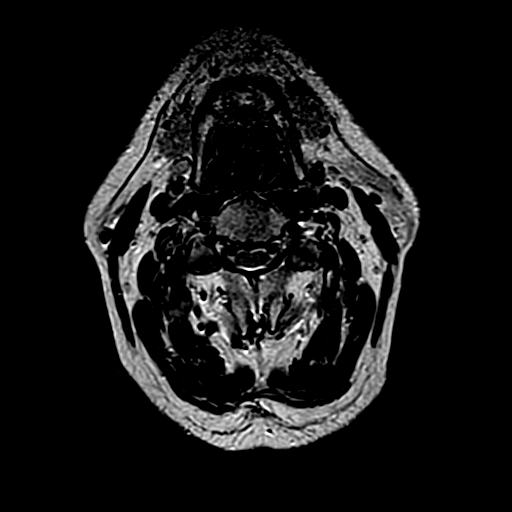
[im 18/18]
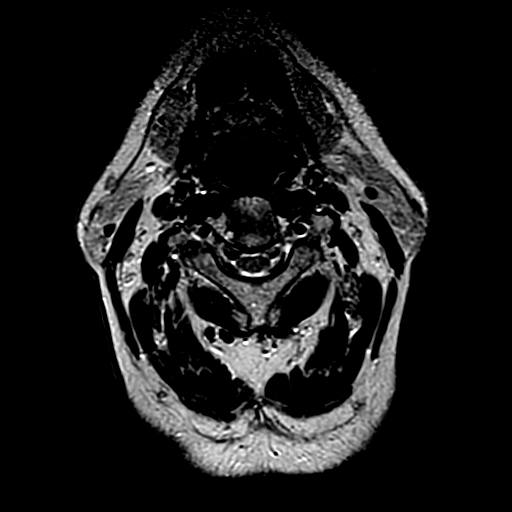

[24 of 48 positions shown; findings below may reference images not displayed]

FINDINGS: Bone marrow signal intensity is normal. There is no acute fracture or subluxation. Visualized spinal cord is normal in signal intensity without evidence of compression at any level. 

C2-3, C3-4 and C4-5 levels are unremarkable. 

At C5-6 level, there is marked disc desiccation. There is also minimal retrolisthesis of C5 on C6 vertebral body.  There is a small broad-based central disc osteophyte complex resulting in mild spinal stenosis.  There is moderate left and mild right neural foraminal stenosis from facet and uncovertebral joint hypertrophy. 

At C6-7 level, there is a small broad-based central disc osteophyte complex resulting in mild spinal stenosis.  There is no significant neural foraminal stenosis. 

C7-T1 level and paraspinal soft tissues are unremarkable.
IMPRESSION: 1. Minimal retrolisthesis of C5 on C6 vertebral body.  

2. Mild spinal stenosis at C5-6 and C6-7 levels from small disc osteophyte complexes. 

3. Multilevel neural foraminal stenosis as detailed above.

## 2022-03-19 IMAGING — MR MRI THORACIC SPINE WITHOUT CONTRAST
4 of 6 series · 24 of 48 positions shown · IV contrast (gadolinium)
Comparison: T-spine x-ray dated 01/24/2021.

﻿EXAM:  [id number]   MRI THORACIC SPINE WITHOUT CONTRAST
INDICATION: Mid back pain, between shoulder blades.  No prior surgery or malignancy.
TECHNIQUE: Multiplanar, multisequential MRI of the thoracic spine was performed without gadolinium contrast.

[Series 5: T2 · sagittal · 4.0mm · 0.78mm/px · 8 of 13 slices shown (1 of 2)]
[im 1/13]
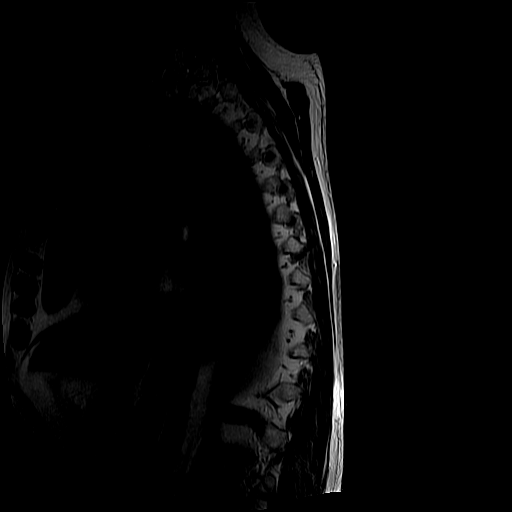
[im 2/13]
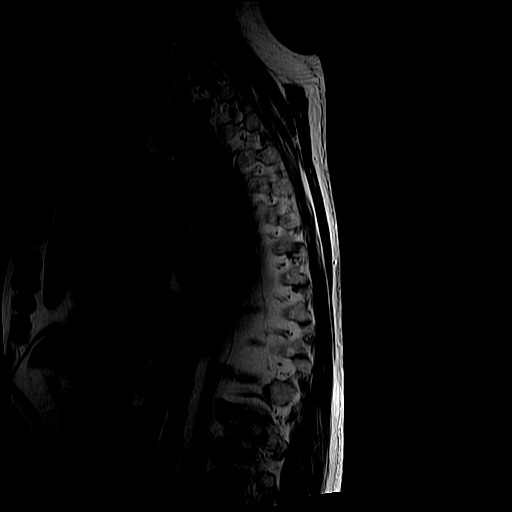
[im 4/13]
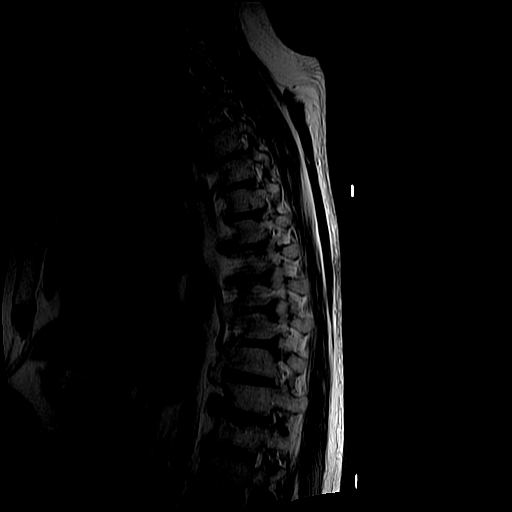
[im 6/13]
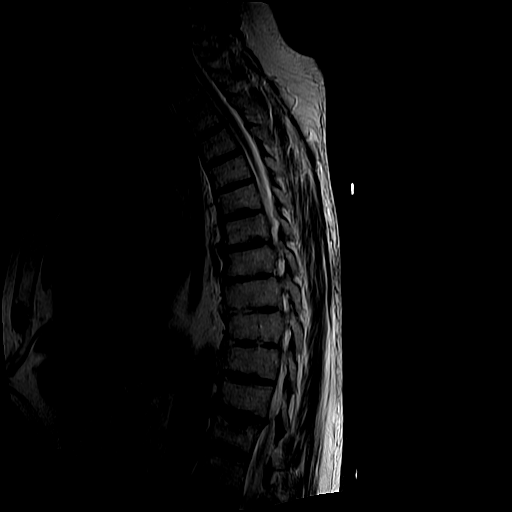
[im 7/13]
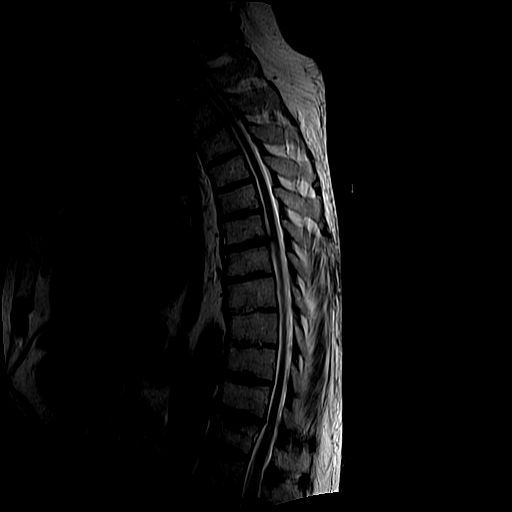
[im 9/13]
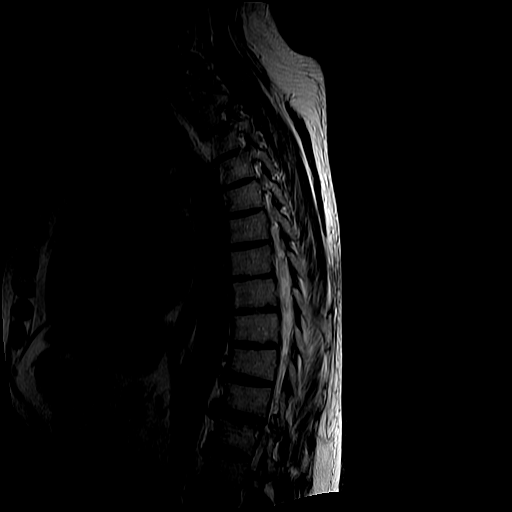
[im 11/13]
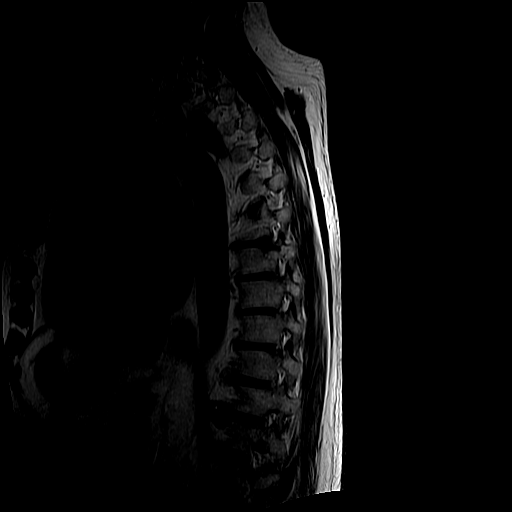
[im 13/13]
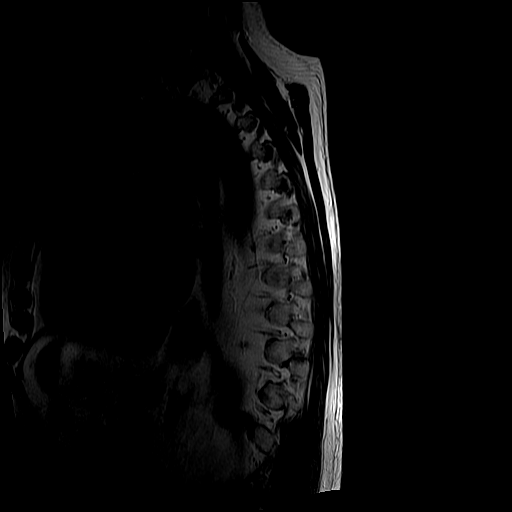

[Series 6: T1 · sagittal · 4.0mm · 0.78mm/px · 5 of 13 slices shown (1 of 2)]
[im 1/13]
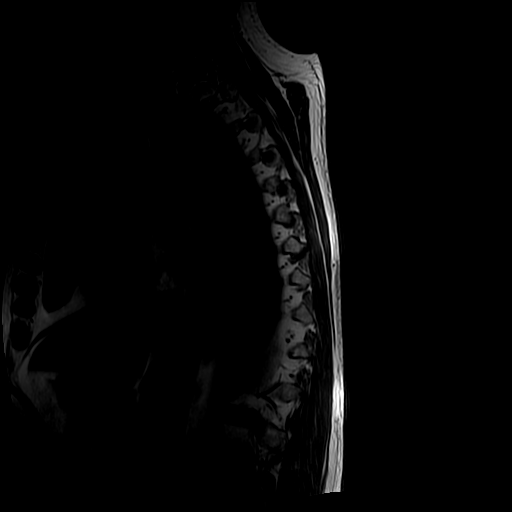
[im 3/13]
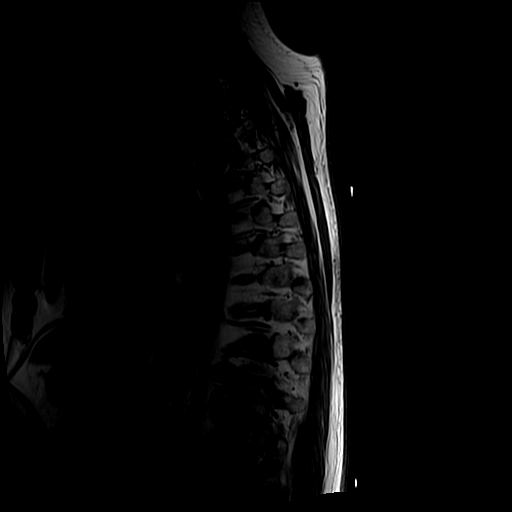
[im 5/13]
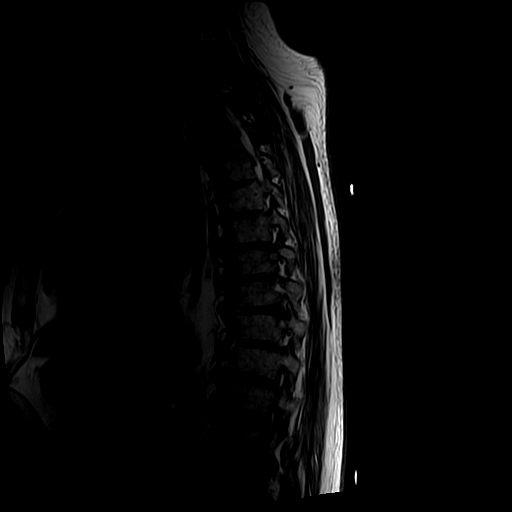
[im 7/13]
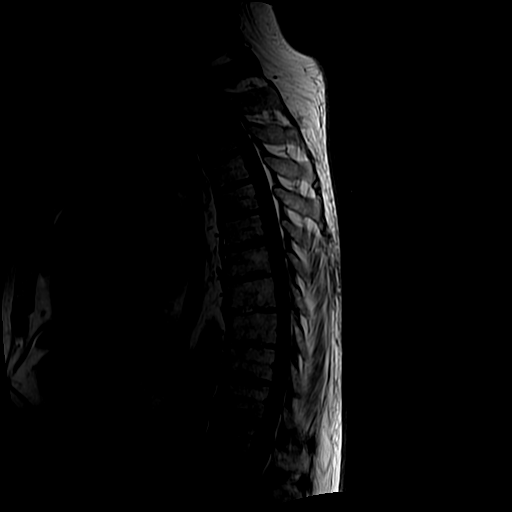
[im 11/13]
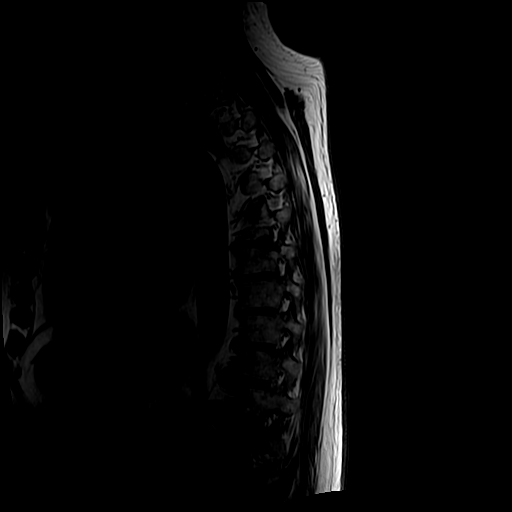

[Series 8: T1 · sagittal · 4.0mm · 0.78mm/px · 3 of 13 slices shown (2 of 2)]
[im 3/13]
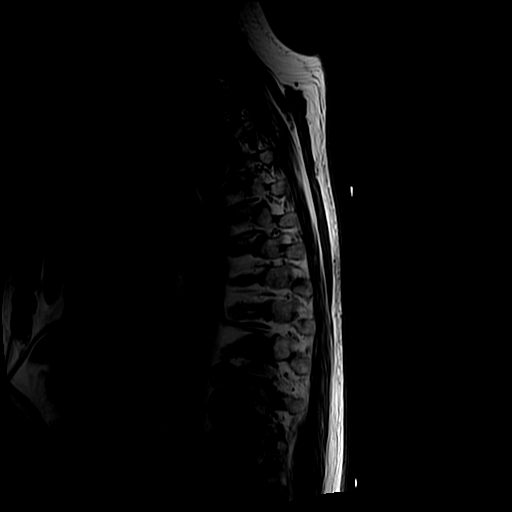
[im 7/13]
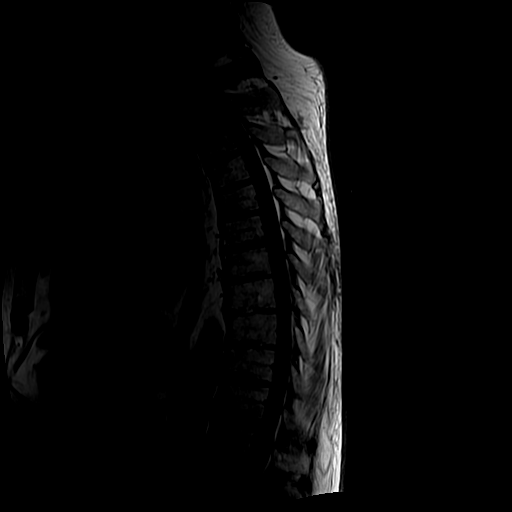
[im 11/13]
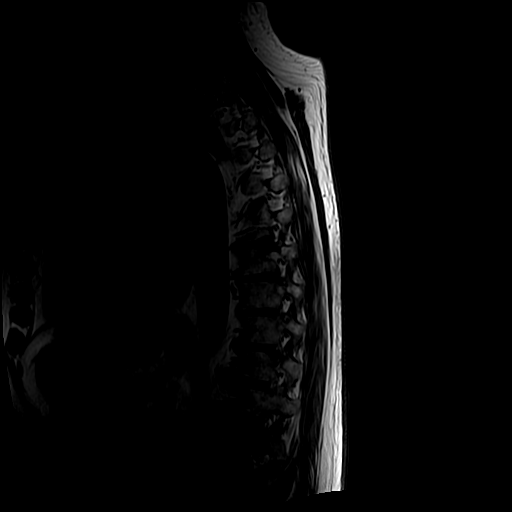

[Series 10: T2 · axial · 4.0mm · 0.62mm/px · z∈[-248,-33]mm · 8 of 14 slices shown (2 of 2)]
[im 1/14]
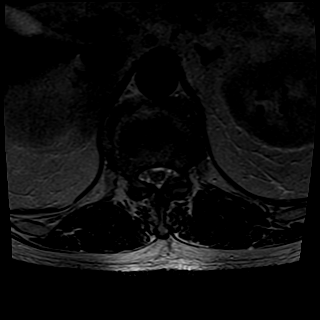
[im 2/14]
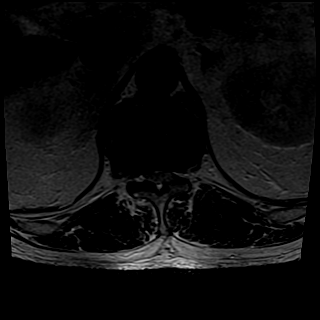
[im 4/14]
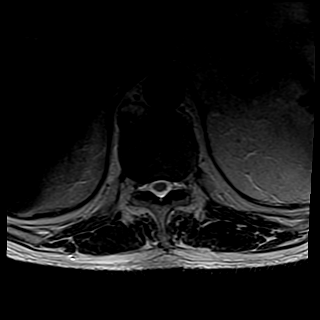
[im 6/14]
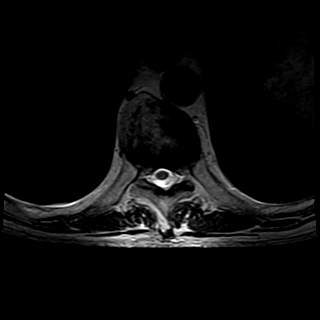
[im 8/14]
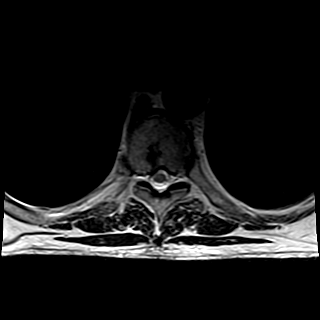
[im 10/14]
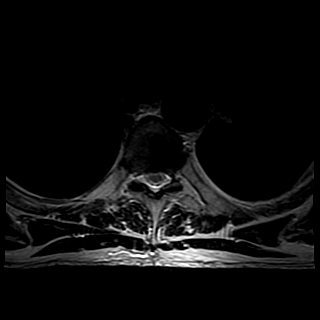
[im 12/14]
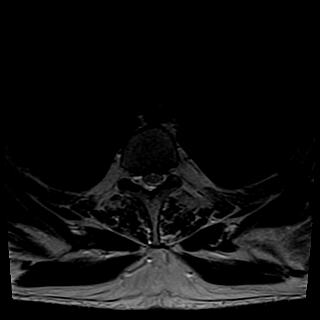
[im 14/14]
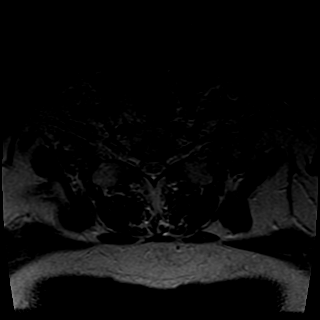

[24 of 48 positions shown; findings below may reference images not displayed]

FINDINGS: No acute bony lesions of thoracic vertebrae are seen.  Mild old compression fractures of T11 and T12 thoracic vertebral bodies are noted.

Mild retropulsion of the upper posterior T12 vertebral body due to old compression fracture causing mild compromise of thecal sac along with hypertrophy of dorsal ligaments.  AP diameter of the thecal sac in the midline measures 9.6 mm.

No other focal compromise of thecal sac or neural foramina are noted in the thoracic region.  Thoracic spinal cord shows no focal abnormalities.  Paravertebral soft tissues do not show any focal abnormalities.
IMPRESSION: 1. Mild old compression fractures of T11 and T12 thoracic vertebral bodies.  No acute bony lesions of thoracic vertebrae. 

2. Mild retropulsion of the upper posterior T12 vertebral body due to old compression fracture causing mild compromise of thecal sac along with hypertrophy of dorsal ligaments.  AP diameter of the thecal sac in the midline measures 9.6 mm.

3. No focal lesions of thoracic spinal cord.  Paravertebral soft tissues are unremarkable.

## 2022-09-01 ENCOUNTER — Other Ambulatory Visit (HOSPITAL_COMMUNITY): Payer: Self-pay | Admitting: FAMILY PRACTICE

## 2022-09-01 DIAGNOSIS — R9430 Abnormal result of cardiovascular function study, unspecified: Secondary | ICD-10-CM

## 2022-09-03 ENCOUNTER — Inpatient Hospital Stay
Admission: RE | Admit: 2022-09-03 | Discharge: 2022-09-03 | Disposition: A | Discharge status: Home / Self Care | Payer: MEDICARE | Source: Ambulatory Visit | Attending: FAMILY PRACTICE | Admitting: FAMILY PRACTICE

## 2022-09-03 ENCOUNTER — Other Ambulatory Visit: Payer: Self-pay

## 2022-09-03 DIAGNOSIS — R9430 Abnormal result of cardiovascular function study, unspecified: Secondary | ICD-10-CM | POA: Insufficient documentation

## 2022-10-02 ENCOUNTER — Other Ambulatory Visit (HOSPITAL_COMMUNITY): Payer: Self-pay | Admitting: Family

## 2022-10-02 DIAGNOSIS — R112 Nausea with vomiting, unspecified: Secondary | ICD-10-CM

## 2022-10-27 ENCOUNTER — Inpatient Hospital Stay
Admission: RE | Admit: 2022-10-27 | Discharge: 2022-10-27 | Disposition: A | Discharge status: Home / Self Care | Payer: MEDICARE | Source: Ambulatory Visit | Attending: Family | Admitting: Family

## 2022-10-27 ENCOUNTER — Other Ambulatory Visit: Payer: Self-pay

## 2022-10-27 DIAGNOSIS — R112 Nausea with vomiting, unspecified: Secondary | ICD-10-CM | POA: Insufficient documentation

## 2022-10-27 MED ORDER — SINCALIDE 5 MCG SOLUTION FOR INJECTION
INTRAMUSCULAR | Status: AC
Start: 2022-10-27 — End: 2022-10-27
  Filled 2022-10-27: qty 5

## 2022-10-27 MED ORDER — SINCALIDE 5 MCG SOLUTION FOR INJECTION
1.6000 ug | INTRAMUSCULAR | Status: AC
Start: 2022-10-27 — End: 2022-10-27
  Administered 2022-10-27: 1.6 ug via INTRAVENOUS

## 2022-11-17 ENCOUNTER — Other Ambulatory Visit: Payer: Self-pay

## 2022-11-17 ENCOUNTER — Emergency Department (HOSPITAL_BASED_OUTPATIENT_CLINIC_OR_DEPARTMENT_OTHER): Payer: MEDICARE

## 2022-11-17 ENCOUNTER — Emergency Department
Admission: EM | Admit: 2022-11-17 | Discharge: 2022-11-17 | Disposition: A | Discharge status: Home / Self Care | Payer: MEDICARE | Attending: Family | Admitting: Family

## 2022-11-17 ENCOUNTER — Encounter (HOSPITAL_BASED_OUTPATIENT_CLINIC_OR_DEPARTMENT_OTHER): Payer: Self-pay

## 2022-11-17 DIAGNOSIS — R188 Other ascites: Secondary | ICD-10-CM

## 2022-11-17 DIAGNOSIS — J90 Pleural effusion, not elsewhere classified: Secondary | ICD-10-CM | POA: Insufficient documentation

## 2022-11-17 DIAGNOSIS — R600 Localized edema: Secondary | ICD-10-CM

## 2022-11-17 DIAGNOSIS — K567 Ileus, unspecified: Secondary | ICD-10-CM | POA: Insufficient documentation

## 2022-11-17 DIAGNOSIS — Y909 Presence of alcohol in blood, level not specified: Secondary | ICD-10-CM | POA: Insufficient documentation

## 2022-11-17 DIAGNOSIS — F1011 Alcohol abuse, in remission: Secondary | ICD-10-CM | POA: Insufficient documentation

## 2022-11-17 DIAGNOSIS — R911 Solitary pulmonary nodule: Secondary | ICD-10-CM | POA: Insufficient documentation

## 2022-11-17 DIAGNOSIS — Z136 Encounter for screening for cardiovascular disorders: Secondary | ICD-10-CM

## 2022-11-17 HISTORY — DX: Essential (primary) hypertension: I10

## 2022-11-17 HISTORY — DX: Type 2 diabetes mellitus without complications: E11.9

## 2022-11-17 HISTORY — DX: Pure hypercholesterolemia, unspecified: E78.00

## 2022-11-17 LAB — BASIC METABOLIC PANEL
ANION GAP: 13 mmol/L (ref 4–13)
BUN/CREA RATIO: 22
BUN: 13 mg/dL (ref 7–18)
CALCIUM: 8.8 mg/dL (ref 8.5–10.1)
CHLORIDE: 109 mmol/L — ABNORMAL HIGH (ref 98–107)
CO2 TOTAL: 25 mmol/L (ref 21–32)
CREATININE: 0.6 mg/dL — ABNORMAL LOW (ref 0.70–1.30)
ESTIMATED GFR: 106 mL/min/{1.73_m2} (ref >59)
GLUCOSE: 100 mg/dL (ref 74–106)
OSMOLALITY, CALCULATED: 293 mOsm/kg — ABNORMAL HIGH (ref 270–290)
POTASSIUM: 3.6 mmol/L (ref 3.5–5.1)
SODIUM: 147 mmol/L — ABNORMAL HIGH (ref 136–145)

## 2022-11-17 LAB — NT-PROBNP: NT-PROBNP: 465 pg/mL — ABNORMAL HIGH (ref <=125)

## 2022-11-17 LAB — CBC WITH DIFF
BASOPHIL #: 0.02 10*3/uL (ref 0.00–0.30)
BASOPHIL %: 0 % (ref 0–3)
EOSINOPHIL #: 0.56 10*3/uL (ref 0.00–0.80)
EOSINOPHIL %: 7 % (ref 0–7)
HCT: 40.3 % — ABNORMAL LOW (ref 42.0–51.0)
HGB: 13 g/dL — ABNORMAL LOW (ref 13.5–18.0)
LYMPHOCYTE #: 1.57 10*3/uL (ref 1.10–5.00)
LYMPHOCYTE %: 19 % — ABNORMAL LOW (ref 25–45)
MCH: 32 pg (ref 27.0–32.0)
MCHC: 32.4 g/dL (ref 32.0–36.0)
MCV: 99.1 fL — ABNORMAL HIGH (ref 78.0–99.0)
MONOCYTE #: 0.71 10*3/uL (ref 0.00–1.30)
MONOCYTE %: 8 % (ref 0–12)
MPV: 8 fL (ref 7.4–10.4)
NEUTROPHIL #: 5.55 10*3/uL (ref 1.80–8.40)
NEUTROPHIL %: 66 % (ref 40–76)
PLATELETS: 251 10*3/uL (ref 140–440)
RBC: 4.07 10*6/uL — ABNORMAL LOW (ref 4.20–6.00)
RDW: 19 % — ABNORMAL HIGH (ref 11.6–14.8)
WBC: 8.4 10*3/uL (ref 4.0–10.5)

## 2022-11-17 LAB — ECG 12 LEAD
Atrial Rate: 73 {beats}/min
Calculated P Axis: 66 degrees
Calculated R Axis: 33 degrees
Calculated T Axis: 52 degrees
PR Interval: 208 ms
QRS Duration: 78 ms
QT Interval: 370 ms
QTC Calculation: 407 ms
Ventricular rate: 73 {beats}/min

## 2022-11-17 LAB — HEPATIC FUNCTION PANEL
ALBUMIN/GLOBULIN RATIO: 1.4 (ref 0.8–1.4)
ALBUMIN: 3.7 g/dL (ref 3.4–5.0)
ALKALINE PHOSPHATASE: 101 U/L (ref 46–116)
ALT (SGPT): 6 U/L (ref <=78)
AST (SGOT): 11 U/L — ABNORMAL LOW (ref 15–37)
BILIRUBIN DIRECT: 0.2 mg/dL (ref 0.0–0.2)
BILIRUBIN TOTAL: 0.6 mg/dL (ref 0.2–1.0)
BILIRUBIN, INDIRECT: 0.4 mg/dL
GLOBULIN: 2.7
PROTEIN TOTAL: 6.4 g/dL (ref 6.4–8.2)

## 2022-11-17 LAB — TROPONIN-I: TROPONIN I: 4 ng/L (ref <20)

## 2022-11-17 LAB — LACTIC ACID LEVEL W/ REFLEX FOR LEVEL >2.0: LACTIC ACID: 1.8 mmol/L (ref 0.4–2.0)

## 2022-11-17 MED ORDER — FUROSEMIDE 10 MG/ML INJECTION SOLUTION
20.0000 mg | INTRAMUSCULAR | Status: AC
Start: 2022-11-17 — End: 2022-11-17
  Administered 2022-11-17: 20 mg via INTRAVENOUS

## 2022-11-17 MED ORDER — POTASSIUM CHLORIDE ER 10 MEQ CAPSULE,EXTENDED RELEASE
10.0000 meq | ORAL_CAPSULE | ORAL | 0 refills | Status: AC
Start: 2022-11-17 — End: 2022-12-20

## 2022-11-17 MED ORDER — FUROSEMIDE 10 MG/ML INJECTION SOLUTION
INTRAMUSCULAR | Status: AC
Start: 2022-11-17 — End: 2022-11-17
  Filled 2022-11-17: qty 4

## 2022-11-17 MED ORDER — IOHEXOL 350 MG IODINE/ML INTRAVENOUS SOLUTION
100.0000 mL | INTRAVENOUS | Status: AC
Start: 2022-11-17 — End: 2022-11-17
  Administered 2022-11-17: 75 mL via INTRAVENOUS

## 2022-11-17 MED ORDER — FUROSEMIDE 20 MG TABLET
20.0000 mg | ORAL_TABLET | ORAL | 0 refills | Status: DC
Start: 2022-11-17 — End: 2023-01-20

## 2022-11-17 NOTE — ED Provider Notes (Signed)
[name] Emergency Department  ED Primary Provider Note  History of Present Illness   Chief Complaint   Patient presents with    Leg Swelling     Arrival: The patient arrived by Car  Jeremy Sexton is a 67 y.o. male who had concerns including Leg Swelling.  Patient with past medical history hypertension, diabetes presents to emergency room for evaluation of swelling in the lower extremities extending up into the abdomen x1 week.  Patient states he did go to [facility] who sent him to the emergency room for further evaluation.  Patient denies any new medication.  He states he has had no similar signs and symptoms in the past.  He denies pain to the lower extremities but states he has neuropathy and limited sensation.  Patient states he does have some mild shortness of breath it is not worse or better with movement.  Patient states that he has had no chest pain, recent travel.  Patient states he was a former alcohol drinker and did drink a lot but quit a proximally 1 year ago.  He has no other associated signs and symptoms at this time    History Reviewed This Encounter: Medical History  Surgical History  Family History  Social History    Physical Exam   ED Triage Vitals [11/17/22 1148]   BP (Non-Invasive) (!) 169/77   Heart Rate 85   Respiratory Rate 16   Temperature 36.2 C (97.1 F)   SpO2 98 %   Weight 81.6 kg (180 lb)   Height 1.829 m (6')       Constitutional:  67 y.o. male who appears in no distress. Normal color, no cyanosis.   HENT:   Head: Normocephalic and atraumatic.   Mouth/Throat: Oropharynx is clear and moist.   Eyes: EOMI, PERRL   Neck: Trachea midline. Neck supple.  Cardiovascular: RRR, S1, S2 No murmurs, rubs or gallops. Intact distal pulses.  Pulmonary/Chest: BS clear and equal bilaterally.  Respirations even and nonlabored.  No respiratory distress. No wheezes, rales or chest tenderness.   Abdominal: Bowel sounds present and normal. Abdomen soft, no  tenderness, no rebound and no guarding.  Back: No midline spinal tenderness, no paraspinal tenderness, no CVA tenderness.           Musculoskeletal: No  tenderness or deformity. +2 pitting edema noted to the bilateral lower extremities.  No redness, negative Homans sign.  Skin: warm and dry. No rash, erythema, pallor or cyanosis  Psychiatric: normal mood and affect. Behavior is normal.   Neurological: Patient keenly alert and responsive, easily able to raise eyebrows, facial muscles/expressions symmetric, speaking in fluent sentences, moving all extremities equally and fully, normal gait  Patient Data     Labs Ordered/Reviewed   BASIC METABOLIC PANEL - Abnormal; Notable for the following components:       Result Value    SODIUM 147 (*)     CHLORIDE 109 (*)     CREATININE 0.60 (*)     OSMOLALITY, CALCULATED 293 (*)     All other components within normal limits    Narrative:     Estimated Glomerular Filtration Rate (eGFR) is calculated using the CKD-EPI (2021) equation, intended for patients 32 years of age and older. If gender is not documented or "unknown", there will be no eGFR calculation.   NT-PROBNP - Abnormal; Notable for the following components:    NT-PROBNP 465 (*)     All other components within normal  limits   HEPATIC FUNCTION PANEL - Abnormal; Notable for the following components:    AST (SGOT) 11 (*)     All other components within normal limits    Narrative:     Above range, check for dilution.   CBC WITH DIFF - Abnormal; Notable for the following components:    RBC 4.07 (*)     HGB 13.0 (*)     HCT 40.3 (*)     MCV 99.1 (*)     RDW 19.0 (*)     LYMPHOCYTE % 19 (*)     All other components within normal limits   LACTIC ACID LEVEL W/ REFLEX FOR LEVEL >2.0 - Normal   TROPONIN-I - Normal    Narrative:     Values received on females ranging between 12-15 ng/L MUST include the next serial troponin to review changes in the delta differences as the reference range for the Access II chemistry analyzer is lower  than the established reference range.     CBC/DIFF    Narrative:     The following orders were created for panel order CBC/DIFF.  Procedure                               Abnormality         Status                     ---------                               -----------         ------                     CBC WITH WUJW[[id number]]                Abnormal            Final result                 Please view results for these tests on the individual orders.     CT ABDOMEN PELVIS W IV CONTRAST   Final Result by Edi, Radresults In (04/22 1459)   BILATERAL PLEURAL EFFUSIONS WITH BIBASILAR ATELECTASIS.      SMALL ASCITES.      MILD ILEUS.         One or more dose reduction techniques were used (e.g., Automated exposure control, adjustment of the mA and/or kV according to patient size, use of iterative reconstruction technique).         Radiologist location ID: [id number]         XR AP MOBILE CHEST   Preliminary Result by Edi, Radresults In (04/22 1242)      Heart size is upper normal. Pulmonary vascularity is not increased, no pleural collection is seen, and no edema is demonstrated. No definite evidence of CHF.      No acute infiltrates are seen at this time.      Dense nodule and some adjacent probable atelectasis in the right perihilar lung. Recommend comparison with older studies to show stability or CT to confirm.                     Radiologist location ID: [id number]           Medical Decision Making  Medical Decision Making  Will discharge patient home.  I did discuss admission with the patient, he states he would prefer to go home and follow up outpatient.  He states he does have an appointment with his primary care provider on 11/29/2022 as well as his GI doctor on 11/26/2022.  I did speak to his gastroenterologist, Dr. Allena Katz, who states to have the patient take furosemide 20 mg Monday, Wednesday, and Friday.  He states they have him keep his appointment with him on 05/01 as ordered.  Patient has mild  intermittent episodes of shortness of breath with O2 saturations at 98%.  I do agree with him that it would be acceptable for him to follow outpatient however he was instructed with worsening symptoms such as increased shortness of breath, increased edema, or yellowing of his skin to return to the ER.  He was also notified to monitor for chest pain and return as needed.  Patient was notified of increased urinary output with these medications.  We will place him on a mild potassium replacement with this as well.  Patient instructed potential causes including differentials of congestive heart failure, renal failure, cirrhosis.  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  He has no complaints of pain.  Patient instructed on home care management as well follow-up with the primary care provider and when to return to the ER.  Patient did verbalize understanding of instructions.    Of note patient was also notified of the pulmonary nodule and potential need for recheck per his primary care provider.  He was also notified he may need some additional testing outpatient to rule out other additional diagnosis.    Amount and/or Complexity of Data Reviewed  Labs: ordered. Decision-making details documented in ED Course.  Radiology: ordered. Decision-making details documented in ED Course.  ECG/medicine tests: ordered. Decision-making details documented in ED Course.     Details: EKG shows normal sinus rhythm at 73 beats per minute.  No ST elevation or bundle-branch blocks noted    Risk  Prescription drug management.    Critical Care  Total time providing critical care: 0 minutes      ED Course as of 11/17/22 1557   Mon Nov 17, 2022   1531 I did go in and speak with the patient.  I discussed findings as well as options with him.  Patient states he would prefer to attempt outpatient care.  He states that he does follow with Dr. Concha Se and has an appt on 11/26/2022. Called Dr Allena Katz.    1535 Dr.  Allena Katz did return call, discuss the patient with him and he states to start the patient on furosemide 20 mg Monday, Wednesday, Friday.  He states have the patient keep his appointment with him as scheduled.            Medications Administered in the ED   iohexol (OMNIPAQUE 350) infusion (75 mL Intravenous Given 11/17/22 1431)   furosemide (LASIX) 10 mg/mL injection (20 mg Intravenous Given 11/17/22 1538)     Clinical Impression   Nodule of right lung - Right side (Primary)   Peripheral edema   Ascites   Pleural effusion       Disposition: Discharged  Current Discharge Medication List        START taking these medications    Details   furosemide (LASIX) 20 mg Oral Tablet Take 1 Tablet (20 mg total) by mouth Every Monday, Wednesday and Friday for  15 doses  Qty: 15 Tablet, Refills: 0      potassium chloride (MICRO K) 10 mEq Oral Capsule, Sustained Release Take 1 Capsule (10 mEq total) by mouth Every Monday, Wednesday and Friday for 15 doses  Qty: 15 Capsule, Refills: 0

## 2022-11-17 NOTE — ED Triage Notes (Signed)
Patient c/o bilateral leg swelling that has spread to his groin x1 week. Denies any other symptoms. Was sent here by [facility].

## 2022-11-17 NOTE — ED Nurses Note (Signed)
Verneita Griffes NP notified of HR in upper 40's.  States OK to discharge.

## 2022-11-17 NOTE — Discharge Instructions (Addendum)
Keep appointment with Dr Allena Katz as scheduled.     FOLLOW UP WITH THE PRIMARY CARE PROVIDER AS SOON AS POSSIBLE BUT NO LATER THAN 3 DAYS NOW.  IF NO PRIMARY CARE PROVIDER EXISTS, THEN THE PATIENT IS INSTRUCTED TO ESTABLISH CARE WITH A PRIMARY CARE PROVIDER. FOLLOW-UP WITH ANY SPECIALIST PROVIDER AS INDICATED AS SOON AS POSSIBLE BUT NO LATER THAN 3 DAYS.  NOTIFY THE PRIMARY CARE PROVIDER THAT YOU WERE IN THE EMERGENCY DEPARTMENT WITHIN 24 HOURS OF DISCHARGE TO FOLLOW-UP ON YOUR RESULTS AND/OR TREATMENTS.  RETURN TO THE EMERGENCY DEPARTMENT IMMEDIATELY IF NEEDED, NO BETTER, WORSE, NEW SYMPTOMS ARISE, OR YOU CANNOT FOLLOW-UP WITH YOUR PRIMARY CARE PROVIDER IN THE PRESCRIBED TIMEFRAME.

## 2022-11-18 DIAGNOSIS — R0602 Shortness of breath: Secondary | ICD-10-CM

## 2022-11-28 ENCOUNTER — Other Ambulatory Visit (HOSPITAL_COMMUNITY): Payer: Self-pay | Admitting: Family

## 2022-11-28 DIAGNOSIS — R609 Edema, unspecified: Secondary | ICD-10-CM

## 2022-11-28 DIAGNOSIS — R0602 Shortness of breath: Secondary | ICD-10-CM

## 2022-12-16 IMAGING — US US SCROTUM
1 series · 14 of 25 positions shown · non-contrast
Comparison: None available.

﻿EXAM:  77278   US SCROTUM
INDICATION: Other specified disorders of male genital organs.

[Series 1: us scrotum · 14 of 71 slices shown]
[im 1/71]
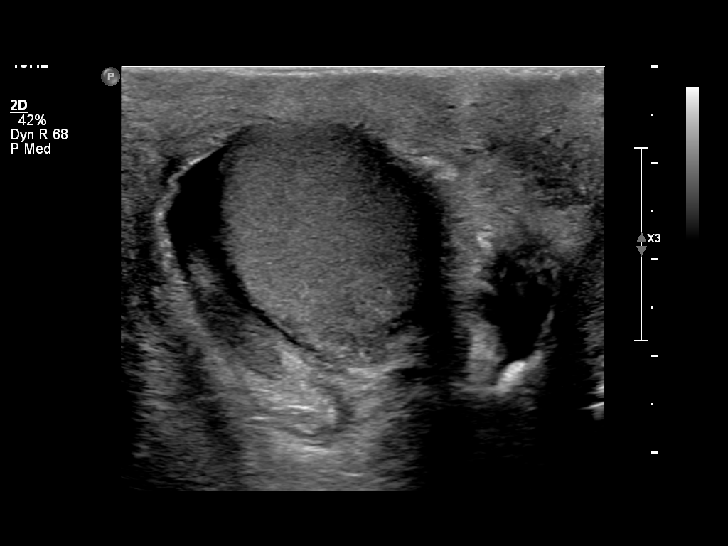
[im 6/71]
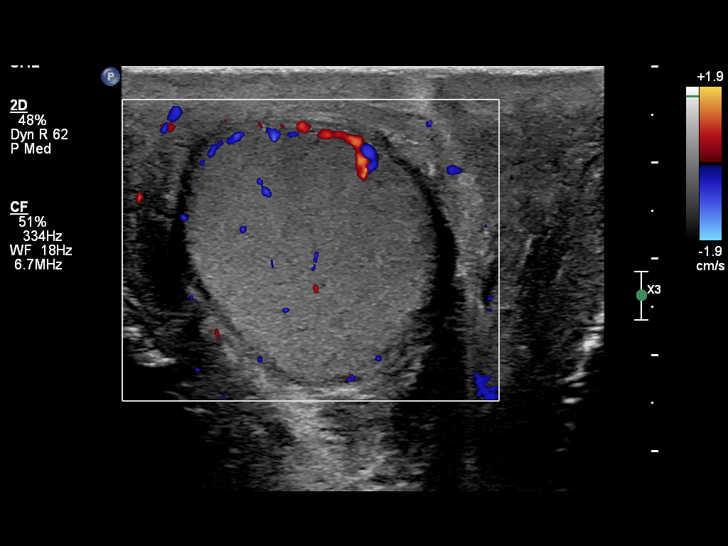
[im 12/71]
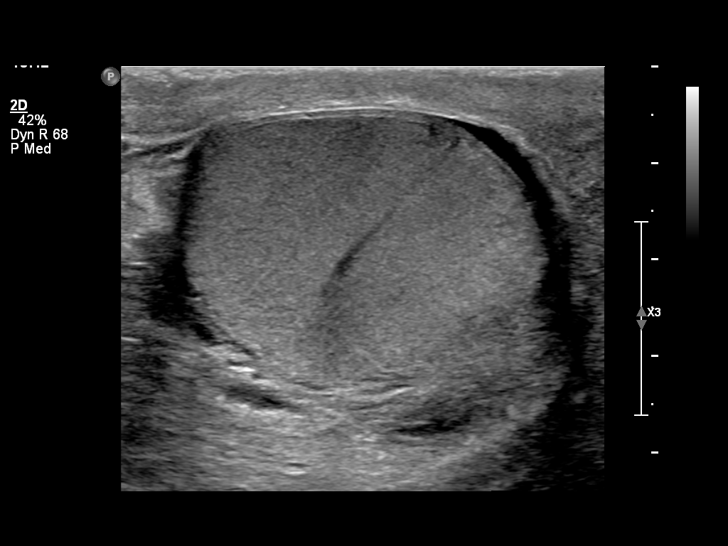
[im 18/71]
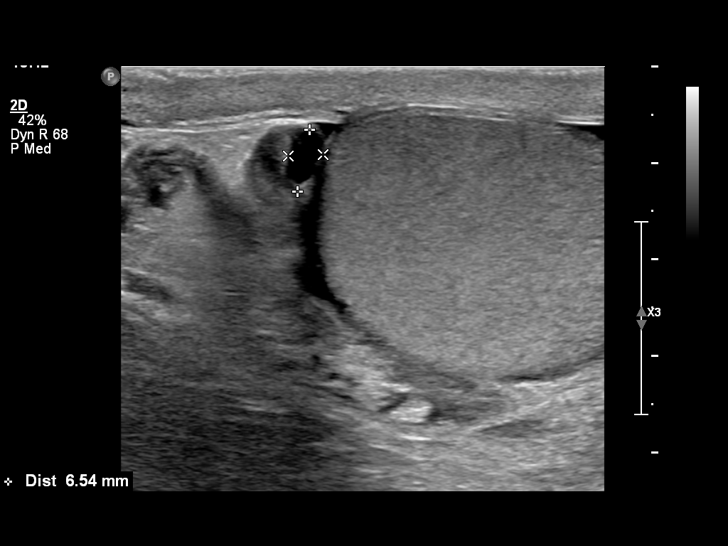
[im 24/71]
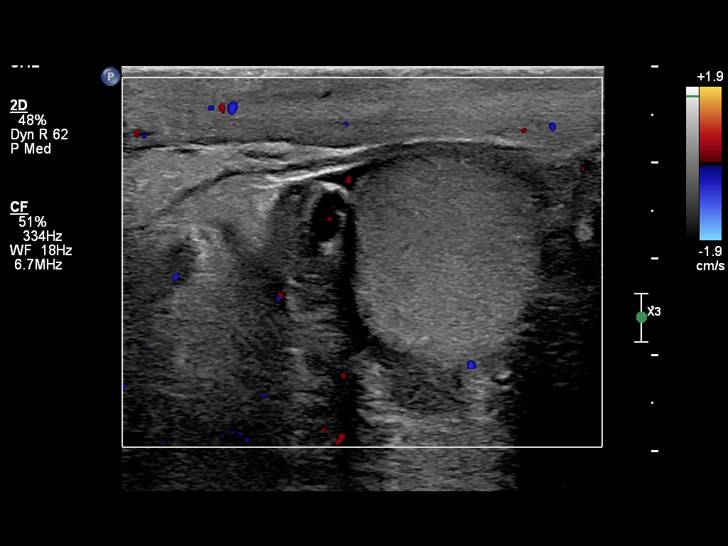
[im 27/71]
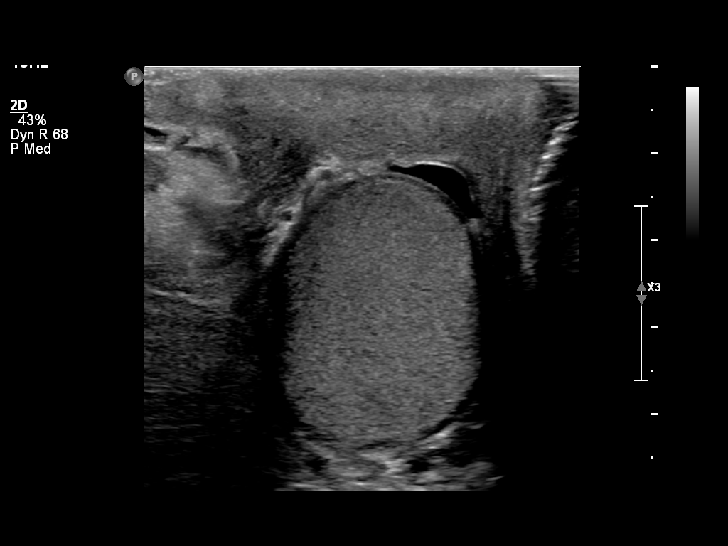
[im 33/71]
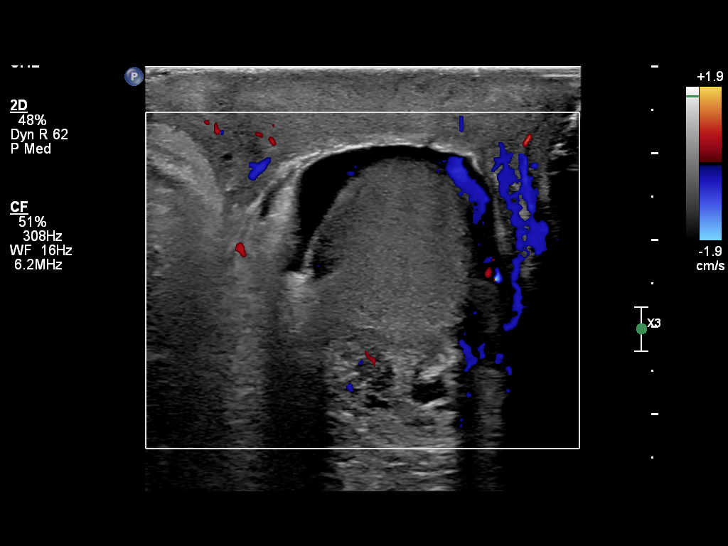
[im 38/71]
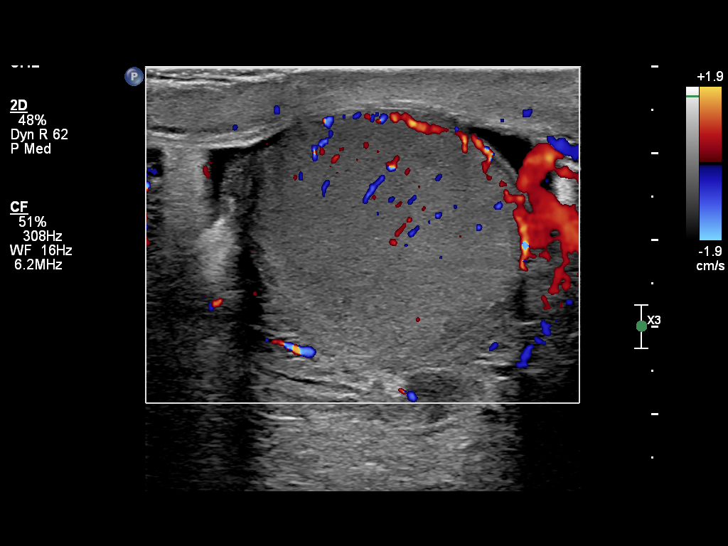
[im 44/71]
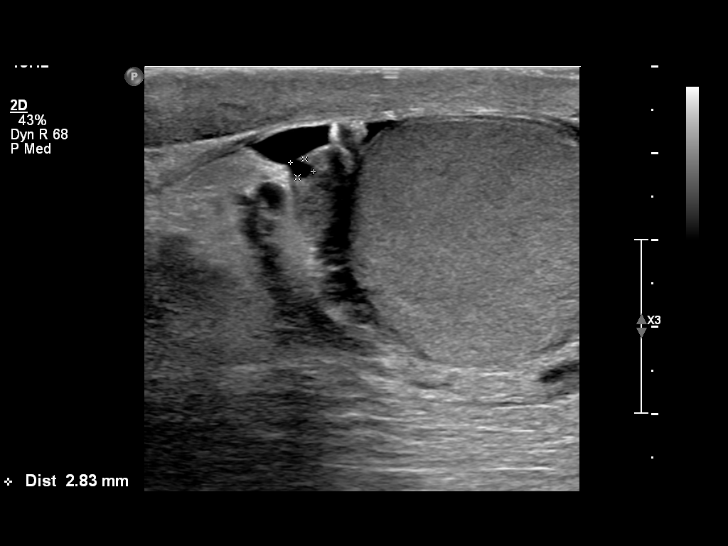
[im 47/71]
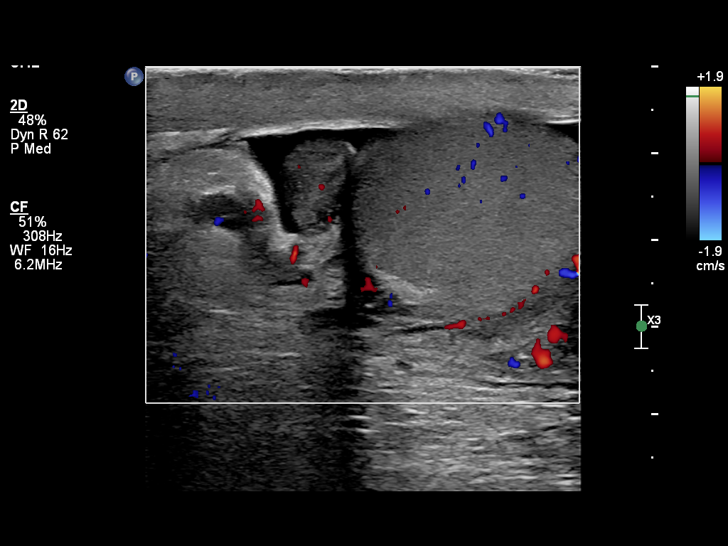
[im 53/71]
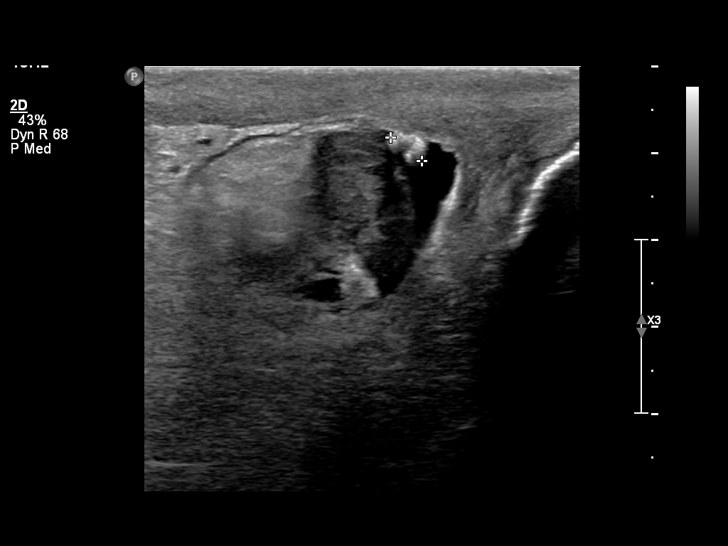
[im 59/71]
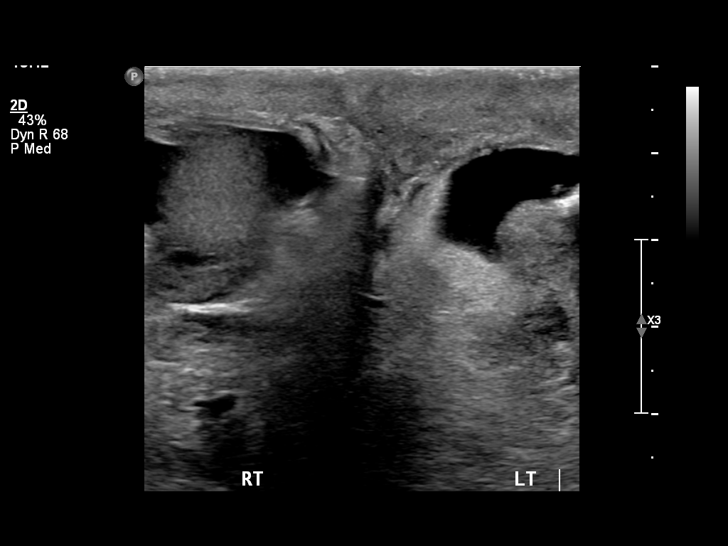
[im 65/71]
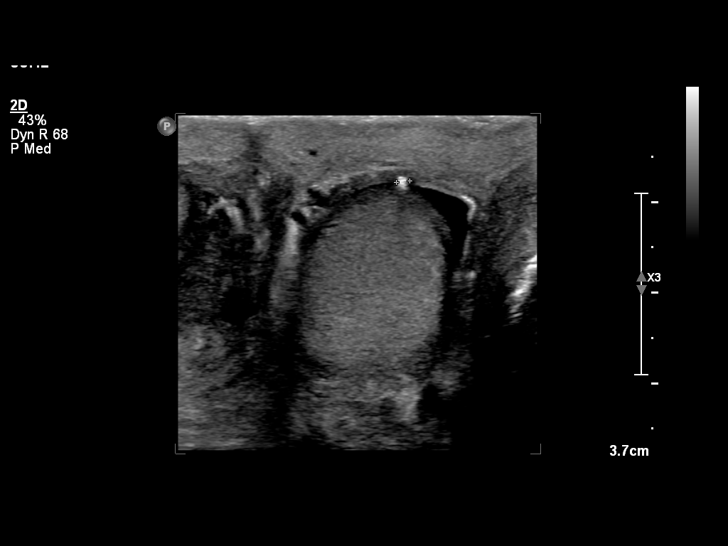
[im 71/71]
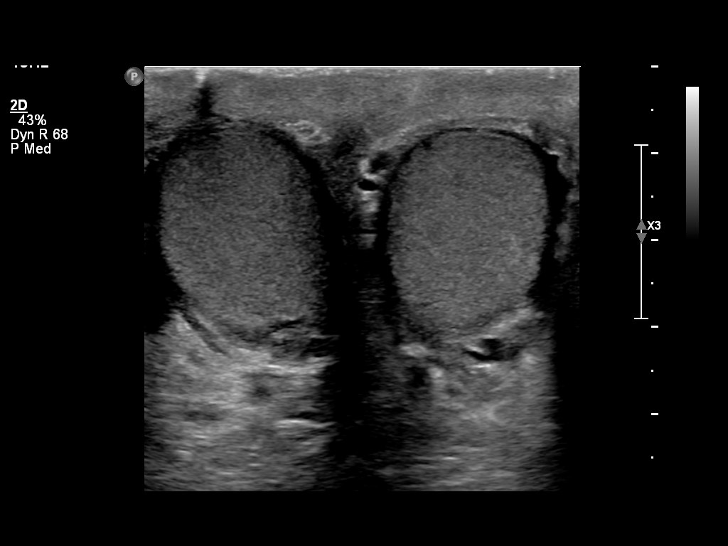

[14 of 25 positions shown; findings below may reference images not displayed]

FINDINGS: Both testicles appear normal in size and appearance with homogeneous echotexture.  No testicular mass is seen.  Doppler evaluation demonstrates normal, symmetrical blood flow with no evidence of testicular torsion.

There is a small hydrocele on the right and a moderate size hydrocele on the left.  

There are small bilateral epididymal cysts.  The epididymis appears otherwise essentially unremarkable.
IMPRESSION: Bilateral hydroceles.

## 2022-12-25 ENCOUNTER — Ambulatory Visit: Payer: MEDICARE

## 2023-01-15 ENCOUNTER — Other Ambulatory Visit: Payer: Self-pay

## 2023-01-15 ENCOUNTER — Other Ambulatory Visit: Payer: MEDICARE

## 2023-01-15 ENCOUNTER — Encounter (INDEPENDENT_AMBULATORY_CARE_PROVIDER_SITE_OTHER): Payer: Self-pay

## 2023-01-15 ENCOUNTER — Ambulatory Visit (INDEPENDENT_AMBULATORY_CARE_PROVIDER_SITE_OTHER): Payer: MEDICARE

## 2023-01-15 VITALS — BP 159/65 | HR 81 | Ht 72.0 in | Wt 194.0 lb

## 2023-01-15 DIAGNOSIS — R809 Proteinuria, unspecified: Secondary | ICD-10-CM

## 2023-01-15 DIAGNOSIS — E559 Vitamin D deficiency, unspecified: Secondary | ICD-10-CM

## 2023-01-15 DIAGNOSIS — I10 Essential (primary) hypertension: Secondary | ICD-10-CM

## 2023-01-15 DIAGNOSIS — E119 Type 2 diabetes mellitus without complications: Secondary | ICD-10-CM

## 2023-01-15 DIAGNOSIS — E785 Hyperlipidemia, unspecified: Secondary | ICD-10-CM | POA: Insufficient documentation

## 2023-01-15 DIAGNOSIS — E876 Hypokalemia: Secondary | ICD-10-CM

## 2023-01-15 DIAGNOSIS — M545 Low back pain, unspecified: Secondary | ICD-10-CM | POA: Insufficient documentation

## 2023-01-15 NOTE — Progress Notes (Deleted)
NEPHROLOGY, [facility]  [address] [place] [address]    History and Physical    Name: Jeremy Sexton MRN:  [id number]   Date: 01/15/2023 DOB:  10-15-1955 (66 y.o.)         Referring Provider:   No referring provider defined for this encounter.    History of Present Illness  Jeremy Sexton is a 67 y.o. male who presents with a chief complaint of New Patient (New patient)   to clinic.    Past History  Current Outpatient Medications   Medication Sig    acetaminophen (TYLENOL) 500 mg Oral Tablet Take 1 Tablet (500 mg total) by mouth Every 4 hours as needed for Pain    atenoloL (TENORMIN) 25 mg Oral Tablet Take 2 Tablets (50 mg total) by mouth Once a day    atorvastatin (LIPITOR) 20 mg Oral Tablet Take 1 Tablet (20 mg total) by mouth Every evening    cloNIDine HCL (CATAPRES) 0.1 mg Oral Tablet Take 1 Tablet (0.1 mg total) by mouth Twice daily    furosemide (LASIX) 20 mg Oral Tablet Take 1 Tablet (20 mg total) by mouth Every Monday, Wednesday and Friday for 15 doses    gabapentin (NEURONTIN) 400 mg Oral Capsule Take 1 Capsule (400 mg total) by mouth Twice daily    lisinopriL (PRINIVIL) 10 mg Oral Tablet Take 1 Tablet (10 mg total) by mouth Once a day    metFORMIN (GLUCOPHAGE) 500 mg Oral Tablet Take 1 Tablet (500 mg total) by mouth Twice daily with food    omeprazole (PRILOSEC) 40 mg Oral Capsule, Delayed Release(E.C.) Take 1 Capsule (40 mg total) by mouth Once a day    spironolactone (ALDACTONE) 100 mg Oral Tablet Take 1 Tablet (100 mg total) by mouth Every morning with breakfast    traZODone (DESYREL) 50 mg Oral Tablet Take 1 Tablet (50 mg total) by mouth Every night     Allergies   Allergen Reactions    Tetracycline      Past Medical History:   Diagnosis Date    Diabetes mellitus, type 2 (CMS HCC)     High cholesterol     HTN (hypertension)          Past Surgical History:   Procedure Laterality Date    HX BACK SURGERY      HX TONSILLECTOMY           Family History  Family Medical History:    None          Social History  Social History     Socioeconomic History    Marital status: Widowed   Tobacco Use    Smoking status: Never     Passive exposure: Never    Smokeless tobacco: Never   Substance and Sexual Activity    Alcohol use: Never     Comment: Previous, quit 1 year ago    Drug use: Never       Review of Systems  {Ros - complete:30496}  Examination  BP (!) 159/65   Pulse 81   Ht 1.829 m (6')   Wt 88 kg (194 lb 0.1 oz)   BMI 26.31 kg/m       {Comprehensive Nephrology Exam:21017926}    Labs  CBC  Diff   Lab Results   Component Value Date/Time    WBC 8.4 11/17/2022 12:28 PM    HGB 13.0 (L) 11/17/2022 12:28 PM    HCT 40.3 (L) 11/17/2022 12:28 PM  PLTCNT 251 11/17/2022 12:28 PM    RBC 4.07 (L) 11/17/2022 12:28 PM    MCV 99.1 (H) 11/17/2022 12:28 PM    MCHC 32.4 11/17/2022 12:28 PM    MCH 32.0 11/17/2022 12:28 PM    RDW 19.0 (H) 11/17/2022 12:28 PM    MPV 8.0 11/17/2022 12:28 PM    Lab Results   Component Value Date/Time    PMNS 66 11/17/2022 12:28 PM    LYMPHOCYTES 19 (L) 11/17/2022 12:28 PM    EOSINOPHIL 7 11/17/2022 12:28 PM    MONOCYTES 8 11/17/2022 12:28 PM    BASOPHILS 0 11/17/2022 12:28 PM    BASOPHILS 0.02 11/17/2022 12:28 PM    PMNABS 5.55 11/17/2022 12:28 PM    LYMPHSABS 1.57 11/17/2022 12:28 PM    EOSABS 0.56 11/17/2022 12:28 PM    MONOSABS 0.71 11/17/2022 12:28 PM            Renal Function    Lab Results   Component Value Date/Time    SODIUM 147 (H) 11/17/2022 12:28 PM    POTASSIUM 3.6 11/17/2022 12:28 PM    CHLORIDE 109 (H) 11/17/2022 12:28 PM    CO2 25 11/17/2022 12:28 PM    ANIONGAP 13 11/17/2022 12:28 PM    BUN 13 11/17/2022 12:28 PM    Lab Results   Component Value Date/Time    CREATININE 0.60 (L) 11/17/2022 12:28 PM    CALCIUM 8.8 11/17/2022 12:28 PM    ALBUMIN 3.7 11/17/2022 12:28 PM          Diagnosis:  Assessment/Plan   1. Proteinuria, unspecified type    2. Vitamin D deficiency      Plan:  No orders of the defined types were placed in this encounter.    Follow Up:  No follow-ups on  file.    Cydney Ok, NP

## 2023-01-16 LAB — KAPPA AND LAMBDA FREE LIGHT CHAINS, SERUM
KAPPA FREE LIGHT CHAINS: 1.82 mg/dL (ref 1.25–3.25)
KAPPA/LAMBDA FLC RATIO: 1.17 (ref 0.80–2.10)
LAMBDA FREE LIGHT CHAINS: 1.55 mg/dL (ref 0.60–2.70)

## 2023-01-16 LAB — MONOCLONAL GAMMOPATHY PROFILE WITH IMMUNOTYPING REFLEX
ALBUMIN: 4.1 g/dL (ref 3.4–4.8)
KAPPA FREE LIGHT CHAINS: 1.82 mg/dL (ref 1.25–3.25)
KAPPA/LAMBDA FLC RATIO: 1.17 (ref 0.80–2.10)
LAMBDA FREE LIGHT CHAINS: 1.55 mg/dL (ref 0.60–2.70)
TOTAL PROTEIN: 6.1 g/dL

## 2023-01-16 LAB — PROTEIN FOR ELECTROPHORESIS: PROTEIN TOTAL: 6.1 g/dL (ref 5.6–7.6)

## 2023-01-16 LAB — ALBUMIN FOR ELECTROPHORESIS: ALBUMIN: 4.1 g/dL (ref 3.4–4.8)

## 2023-01-16 LAB — HEP-2 SUBSTRATE ANTINUCLEAR ANTIBODIES (ANA), SERUM: ANA INTERPRETATION: NEGATIVE

## 2023-01-17 NOTE — Progress Notes (Signed)
NEPHROLOGY, [facility]  [address] [place] [address]    Progress Note    Name: Jeremy Sexton MRN:  [id number]   Date: 01/15/2023 DOB:  1956-02-02 (66 y.o.)              Nephrology Out  Patient Visit         HPI : 67 y.o. male here to establish care for proteinuria, GFR 103 creatinine 0.5 past medical history hypertension type 2 diabetes chronic back pain with history of NSAID use.  States his blood pressure has been   Family history:  No family history of kidney disease social history no history of tobacco use alcohol use or illicit drug use.   Other lab creatinine 0.57 GFR 103 potassium 3.3   Urine creatinine 136 urine albumin 0.6 mg/dL urine albumin/creatinine ratio 4.7     ROS:     Systematic review of 12 organ systems was negative except what mentioned in in the HPI.     OBJECTIVE:   BP (!) 159/65   Pulse 81   Ht 1.829 m (6')   Wt 88 kg (194 lb 0.1 oz)   BMI 26.31 kg/m       General:  NAD, AAOx3  HEENT:  EOMI,  no icterus  NECK: No increased JVD.  Normal inspection   HEART: Normal S1 and S2. Regular rhythm. No murmurs or rubs. No chest wall tenderness   LUNGS: Clear to auscultation bilateral. No wheezes, rales, or rhonchi.   ABDOMEN: +BS, Soft, nontender and nondistended. No rebound or guarding present.   EXTREMITIES: No edema. No cyanosis or clubbing.No asterixis    NEURO : Normal speech, EOMI.       LABORATORY DATA:   Lab Results   Component Value Date    BUN 13 11/17/2022    CREATININE 0.60 (L) 11/17/2022    BUNCRRATIO 22 11/17/2022    GFR 106 11/17/2022    SODIUM 147 (H) 11/17/2022    POTASSIUM 3.6 11/17/2022    CHLORIDE 109 (H) 11/17/2022    CO2 25 11/17/2022    ANIONGAP 13 11/17/2022    CALCIUM 8.8 11/17/2022    ALBUMIN 4.1 01/15/2023    ALBUMIN 3.7 11/17/2022    HGB 13.0 (L) 11/17/2022    HCT 40.3 (L) 11/17/2022           MEDICATIONS:  Outpatient Medications Marked as Taking for the 01/15/23 encounter (Office Visit) with Cydney Ok, NP   Medication Sig    acetaminophen  (TYLENOL) 500 mg Oral Tablet Take 1 Tablet (500 mg total) by mouth Every 4 hours as needed for Pain    atenoloL (TENORMIN) 25 mg Oral Tablet Take 2 Tablets (50 mg total) by mouth Once a day    atorvastatin (LIPITOR) 20 mg Oral Tablet Take 1 Tablet (20 mg total) by mouth Every evening    cloNIDine HCL (CATAPRES) 0.1 mg Oral Tablet Take 1 Tablet (0.1 mg total) by mouth Twice daily    [EXPIRED] furosemide (LASIX) 20 mg Oral Tablet Take 1 Tablet (20 mg total) by mouth Every Monday, Wednesday and Friday for 15 doses    gabapentin (NEURONTIN) 400 mg Oral Capsule Take 1 Capsule (400 mg total) by mouth Twice daily    lisinopriL (PRINIVIL) 10 mg Oral Tablet Take 1 Tablet (10 mg total) by mouth Once a day    metFORMIN (GLUCOPHAGE) 500 mg Oral Tablet Take 1 Tablet (500 mg total) by mouth Twice daily with food  omeprazole (PRILOSEC) 40 mg Oral Capsule, Delayed Release(E.C.) Take 1 Capsule (40 mg total) by mouth Once a day    spironolactone (ALDACTONE) 100 mg Oral Tablet Take 1 Tablet (100 mg total) by mouth Every morning with breakfast    traZODone (DESYREL) 50 mg Oral Tablet Take 1 Tablet (50 mg total) by mouth Every night     Current Outpatient Medications   Medication Instructions    acetaminophen (TYLENOL) 500 mg, Oral, EVERY 4 HOURS PRN    atenoloL (TENORMIN) 50 mg, Oral, DAILY    atorvastatin (LIPITOR) 20 mg, Oral, EVERY EVENING    cloNIDine HCL (CATAPRES) 0.1 mg, Oral, 2 TIMES DAILY    gabapentin (NEURONTIN) 400 mg, Oral, 2 TIMES DAILY    lisinopriL (PRINIVIL) 10 mg, Oral, DAILY    metFORMIN (GLUCOPHAGE) 500 mg, Oral, 2 TIMES DAILY WITH FOOD    omeprazole (PRILOSEC) 40 mg, Oral, DAILY    spironolactone (ALDACTONE) 100 mg, Oral, EVERY MORNING WITH BREAKFAST    traZODone (DESYREL) 50 mg, Oral, NIGHTLY       ASSESSMENT / PLAN:   ENCOUNTER DIAGNOSES     ICD-10-CM   1. Proteinuria, unspecified type  R80.9   2. Hypertension, unspecified type  I10   3. Type 2 diabetes mellitus (CMS HCC)  E11.9   4. Vitamin D deficiency  E55.9         Proteinuria  -urine for electrophoresis  -monoclonal light chain  -BMP  -ultrasound August 20, 2022 right and left kidneys are normal in echogenicity.  No evidence of hydronephrosis mass cysts or calculus.     Hypertension  -Goal 130/70  -continue lisinopril 10 mg daily   -Continue clonidine 0.1 twice daily  -furosemide 20 mg daily       Diabetes Type II  -Goal HA1C  < 7  -Metformin 500 mg twice daily       Vitamin D deficiency   -monitor Vitamin D level      Hypokalemia  -monitor potassium   -continue potassium supplements     Cydney Ok, NP     Discussed with the patient Labs and results. Continue potassium supplements. Follow up in three months. Avoid NSAIDs. Keep blood pressure under control.

## 2023-01-20 ENCOUNTER — Ambulatory Visit (HOSPITAL_COMMUNITY): Payer: MEDICARE | Admitting: Certified Registered"

## 2023-01-20 ENCOUNTER — Inpatient Hospital Stay (HOSPITAL_COMMUNITY)
Admission: RE | Admit: 2023-01-20 | Discharge: 2023-01-20 | Disposition: A | Discharge status: Home / Self Care | Payer: MEDICARE | Source: Ambulatory Visit

## 2023-01-20 ENCOUNTER — Inpatient Hospital Stay (HOSPITAL_COMMUNITY): Payer: MEDICARE | Admitting: Internal Medicine

## 2023-01-20 ENCOUNTER — Inpatient Hospital Stay
Admission: EM | Admit: 2023-01-20 | Discharge: 2023-01-22 | DRG: 256 | Disposition: A | Discharge status: Home Health | Payer: MEDICARE | Attending: HOSPITALIST | Admitting: HOSPITALIST

## 2023-01-20 ENCOUNTER — Encounter (HOSPITAL_COMMUNITY): Payer: Self-pay | Admitting: Internal Medicine

## 2023-01-20 ENCOUNTER — Encounter (HOSPITAL_COMMUNITY)
Admission: EM | Disposition: A | Discharge status: Home / Self Care | Payer: Self-pay | Source: Home / Self Care | Attending: Internal Medicine

## 2023-01-20 ENCOUNTER — Other Ambulatory Visit: Payer: Self-pay

## 2023-01-20 DIAGNOSIS — E876 Hypokalemia: Secondary | ICD-10-CM | POA: Diagnosis present

## 2023-01-20 DIAGNOSIS — I96 Gangrene, not elsewhere classified: Secondary | ICD-10-CM

## 2023-01-20 DIAGNOSIS — L03115 Cellulitis of right lower limb: Principal | ICD-10-CM | POA: Diagnosis present

## 2023-01-20 DIAGNOSIS — K219 Gastro-esophageal reflux disease without esophagitis: Secondary | ICD-10-CM | POA: Diagnosis present

## 2023-01-20 DIAGNOSIS — L0390 Cellulitis, unspecified: Secondary | ICD-10-CM | POA: Diagnosis present

## 2023-01-20 DIAGNOSIS — I10 Essential (primary) hypertension: Secondary | ICD-10-CM | POA: Diagnosis present

## 2023-01-20 DIAGNOSIS — M869 Osteomyelitis, unspecified: Secondary | ICD-10-CM

## 2023-01-20 DIAGNOSIS — Z7982 Long term (current) use of aspirin: Secondary | ICD-10-CM

## 2023-01-20 DIAGNOSIS — M7989 Other specified soft tissue disorders: Secondary | ICD-10-CM

## 2023-01-20 DIAGNOSIS — E1140 Type 2 diabetes mellitus with diabetic neuropathy, unspecified: Secondary | ICD-10-CM | POA: Diagnosis present

## 2023-01-20 DIAGNOSIS — E11621 Type 2 diabetes mellitus with foot ulcer: Secondary | ICD-10-CM | POA: Diagnosis present

## 2023-01-20 DIAGNOSIS — E1152 Type 2 diabetes mellitus with diabetic peripheral angiopathy with gangrene: Principal | ICD-10-CM | POA: Diagnosis present

## 2023-01-20 DIAGNOSIS — E7800 Pure hypercholesterolemia, unspecified: Secondary | ICD-10-CM | POA: Diagnosis present

## 2023-01-20 DIAGNOSIS — E1169 Type 2 diabetes mellitus with other specified complication: Secondary | ICD-10-CM | POA: Diagnosis present

## 2023-01-20 DIAGNOSIS — Z79899 Other long term (current) drug therapy: Secondary | ICD-10-CM

## 2023-01-20 DIAGNOSIS — I491 Atrial premature depolarization: Secondary | ICD-10-CM

## 2023-01-20 DIAGNOSIS — Z7984 Long term (current) use of oral hypoglycemic drugs: Secondary | ICD-10-CM

## 2023-01-20 DIAGNOSIS — A419 Sepsis, unspecified organism: Secondary | ICD-10-CM

## 2023-01-20 DIAGNOSIS — L97519 Non-pressure chronic ulcer of other part of right foot with unspecified severity: Secondary | ICD-10-CM | POA: Diagnosis present

## 2023-01-20 DIAGNOSIS — E119 Type 2 diabetes mellitus without complications: Secondary | ICD-10-CM

## 2023-01-20 DIAGNOSIS — J984 Other disorders of lung: Secondary | ICD-10-CM

## 2023-01-20 DIAGNOSIS — M86171 Other acute osteomyelitis, right ankle and foot: Secondary | ICD-10-CM | POA: Diagnosis present

## 2023-01-20 LAB — COMPREHENSIVE METABOLIC PANEL, NON-FASTING
ALBUMIN/GLOBULIN RATIO: 1.4 (ref 0.8–1.4)
ALBUMIN: 3.7 g/dL (ref 3.5–5.7)
ALKALINE PHOSPHATASE: 93 U/L (ref 34–104)
ALT (SGPT): 10 U/L (ref 7–52)
ANION GAP: 10 mmol/L (ref 4–13)
AST (SGOT): 10 U/L — ABNORMAL LOW (ref 13–39)
BILIRUBIN TOTAL: 1.3 mg/dL — ABNORMAL HIGH (ref 0.3–1.2)
BUN/CREA RATIO: 36 — ABNORMAL HIGH (ref 6–22)
BUN: 33 mg/dL — ABNORMAL HIGH (ref 7–25)
CALCIUM, CORRECTED: 9.7 mg/dL (ref 8.9–10.8)
CALCIUM: 9.5 mg/dL (ref 8.6–10.3)
CHLORIDE: 103 mmol/L (ref 98–107)
CO2 TOTAL: 27 mmol/L (ref 21–31)
CREATININE: 0.91 mg/dL (ref 0.60–1.30)
ESTIMATED GFR: 93 mL/min/{1.73_m2} (ref >59)
GLOBULIN: 2.6 — ABNORMAL LOW (ref 2.9–5.4)
GLUCOSE: 138 mg/dL — ABNORMAL HIGH (ref 74–109)
OSMOLALITY, CALCULATED: 289 mOsm/kg (ref 270–290)
POTASSIUM: 2.7 mmol/L — CL (ref 3.5–5.1)
PROTEIN TOTAL: 6.3 g/dL — ABNORMAL LOW (ref 6.4–8.9)
SODIUM: 140 mmol/L (ref 136–145)

## 2023-01-20 LAB — ECG 12 LEAD
Atrial Rate: 66 {beats}/min
Calculated P Axis: 0 degrees
Calculated R Axis: 28 degrees
Calculated T Axis: 25 degrees
PR Interval: 130 ms
QRS Duration: 82 ms
QT Interval: 408 ms
QTC Calculation: 427 ms
Ventricular rate: 66 {beats}/min

## 2023-01-20 LAB — CBC WITH DIFF
BASOPHIL #: 0 10*3/uL (ref 0.00–0.10)
BASOPHIL %: 0 % (ref 0–1)
EOSINOPHIL #: 0.2 10*3/uL (ref 0.00–0.50)
EOSINOPHIL %: 2 %
HCT: 32.4 % — ABNORMAL LOW (ref 36.7–47.1)
HGB: 11 g/dL — ABNORMAL LOW (ref 12.5–16.3)
LYMPHOCYTE #: 1.5 10*3/uL (ref 1.00–3.00)
LYMPHOCYTE %: 13 % — ABNORMAL LOW (ref 16–44)
MCH: 30.6 pg (ref 23.8–33.4)
MCHC: 33.9 g/dL (ref 32.5–36.3)
MCV: 90.4 fL (ref 73.0–96.2)
MONOCYTE #: 1 10*3/uL (ref 0.30–1.00)
MONOCYTE %: 9 % (ref 5–13)
MPV: 8.5 fL (ref 7.4–11.4)
NEUTROPHIL #: 8.7 10*3/uL — ABNORMAL HIGH (ref 1.85–7.80)
NEUTROPHIL %: 76 % (ref 43–77)
PLATELETS: 153 10*3/uL (ref 140–440)
RBC: 3.59 10*6/uL — ABNORMAL LOW (ref 4.06–5.63)
RDW: 14.7 % (ref 12.1–16.2)
WBC: 11.4 10*3/uL — ABNORMAL HIGH (ref 3.6–10.2)

## 2023-01-20 LAB — TROPONIN-I: TROPONIN I: 115 ng/L — ABNORMAL HIGH (ref <20)

## 2023-01-20 LAB — LACTIC ACID LEVEL W/ REFLEX FOR LEVEL >2.0: LACTIC ACID: 1.3 mmol/L (ref 0.5–2.2)

## 2023-01-20 LAB — THYROID STIMULATING HORMONE WITH FREE T4 REFLEX: TSH: 1.125 u[IU]/mL (ref 0.450–5.330)

## 2023-01-20 LAB — PT/INR
INR: 1.44 — ABNORMAL HIGH (ref 0.84–1.10)
PROTHROMBIN TIME: 16.9 seconds — ABNORMAL HIGH (ref 9.8–12.7)

## 2023-01-20 LAB — C-REACTIVE PROTEIN (CRP): C-REACTIVE PROTEIN (CRP): 33.8 mg/dL — ABNORMAL HIGH (ref 0.1–0.5)

## 2023-01-20 LAB — POC BLOOD GLUCOSE (RESULTS): GLUCOSE, POC: 126 mg/dl — ABNORMAL HIGH (ref 70–100)

## 2023-01-20 LAB — MAGNESIUM: MAGNESIUM: 1.7 mg/dL — ABNORMAL LOW (ref 1.9–2.7)

## 2023-01-20 LAB — HGA1C (HEMOGLOBIN A1C WITH EST AVG GLUCOSE): HEMOGLOBIN A1C: 5.2 % (ref 4.0–6.0)

## 2023-01-20 LAB — PTT (PARTIAL THROMBOPLASTIN TIME): APTT: 34.1 seconds (ref 25.0–38.0)

## 2023-01-20 SURGERY — AMPUTATION TOE
Anesthesia: General | Site: Toe | Laterality: Right | Wound class: Dirty or Infected Wounds-Include old traumatic wounds

## 2023-01-20 MED ORDER — LIDOCAINE (PF) 100 MG/5 ML (2 %) INTRAVENOUS SYRINGE
INJECTION | Freq: Once | INTRAVENOUS | Status: DC | PRN
Start: 2023-01-20 — End: 2023-01-20
  Administered 2023-01-20: 50 mg via INTRAVENOUS

## 2023-01-20 MED ORDER — HYDROMORPHONE 2 MG/ML INJECTION WRAPPER
0.2500 mg | INJECTION | INTRAMUSCULAR | Status: DC | PRN
Start: 2023-01-20 — End: 2023-01-20

## 2023-01-20 MED ORDER — FUROSEMIDE 40 MG TABLET
20.0000 mg | ORAL_TABLET | Freq: Every day | ORAL | Status: DC
Start: 2023-01-20 — End: 2023-01-22
  Administered 2023-01-20: 0 mg via ORAL
  Administered 2023-01-21 – 2023-01-22 (×2): 20 mg via ORAL
  Filled 2023-01-20 (×2): qty 1

## 2023-01-20 MED ORDER — SODIUM CHLORIDE 0.9 % (FLUSH) INJECTION SYRINGE
3.0000 mL | INJECTION | Freq: Three times a day (TID) | INTRAMUSCULAR | Status: DC
Start: 2023-01-20 — End: 2023-01-20

## 2023-01-20 MED ORDER — POTASSIUM CHLORIDE 20 MEQ/100ML IN STERILE WATER INTRAVENOUS PIGGYBACK
20.0000 meq | INJECTION | INTRAVENOUS | Status: AC
Start: 2023-01-20 — End: 2023-01-20
  Administered 2023-01-20: 0 meq via INTRAVENOUS
  Administered 2023-01-20: 20 meq via INTRAVENOUS

## 2023-01-20 MED ORDER — ALBUTEROL SULFATE 2.5 MG/3 ML (0.083 %) SOLUTION FOR NEBULIZATION
2.5000 mg | INHALATION_SOLUTION | Freq: Once | RESPIRATORY_TRACT | Status: DC | PRN
Start: 2023-01-20 — End: 2023-01-20

## 2023-01-20 MED ORDER — FENTANYL (PF) 50 MCG/ML INJECTION WRAPPER
INJECTION | Freq: Once | INTRAMUSCULAR | Status: DC | PRN
Start: 2023-01-20 — End: 2023-01-20
  Administered 2023-01-20 (×2): 50 ug via INTRAVENOUS

## 2023-01-20 MED ORDER — ENOXAPARIN 40 MG/0.4 ML SUBCUTANEOUS SYRINGE
40.0000 mg | INJECTION | SUBCUTANEOUS | Status: DC
Start: 2023-01-20 — End: 2023-01-22
  Administered 2023-01-20: 0 mg via SUBCUTANEOUS
  Administered 2023-01-21: 40 mg via SUBCUTANEOUS
  Administered 2023-01-22: 0 mg via SUBCUTANEOUS
  Filled 2023-01-20: qty 0.4

## 2023-01-20 MED ORDER — POTASSIUM CHLORIDE 20 MEQ/100ML IN STERILE WATER INTRAVENOUS PIGGYBACK
INJECTION | INTRAVENOUS | Status: AC
Start: 2023-01-20 — End: 2023-01-20
  Filled 2023-01-20: qty 100

## 2023-01-20 MED ORDER — LACTATED RINGERS INTRAVENOUS SOLUTION
INTRAVENOUS | Status: DC
Start: 2023-01-20 — End: 2023-01-21
  Administered 2023-01-21: 0 mL via INTRAVENOUS

## 2023-01-20 MED ORDER — FENTANYL (PF) 50 MCG/ML INJECTION SOLUTION
INTRAMUSCULAR | Status: AC
Start: 2023-01-20 — End: 2023-01-20
  Filled 2023-01-20: qty 2

## 2023-01-20 MED ORDER — MIDAZOLAM 5 MG/ML INJECTION WRAPPER
2.0000 mg | Freq: Once | INTRAMUSCULAR | Status: DC | PRN
Start: 2023-01-20 — End: 2023-01-20
  Administered 2023-01-20: 2 mg via INTRAVENOUS

## 2023-01-20 MED ORDER — LACTATED RINGERS INTRAVENOUS SOLUTION
INTRAVENOUS | Status: DC
Start: 2023-01-20 — End: 2023-01-20

## 2023-01-20 MED ORDER — CLONIDINE HCL 0.1 MG TABLET
ORAL_TABLET | ORAL | Status: AC
Start: 2023-01-20 — End: 2023-01-20
  Filled 2023-01-20: qty 1

## 2023-01-20 MED ORDER — FENTANYL (PF) 50 MCG/ML INJECTION WRAPPER
50.0000 ug | INJECTION | INTRAMUSCULAR | Status: DC | PRN
Start: 2023-01-20 — End: 2023-01-20

## 2023-01-20 MED ORDER — ROCURONIUM 10 MG/ML INTRAVENOUS SOLUTION
Freq: Once | INTRAVENOUS | Status: DC | PRN
Start: 2023-01-20 — End: 2023-01-20
  Administered 2023-01-20: 5 mg via INTRAVENOUS

## 2023-01-20 MED ORDER — IPRATROPIUM 0.5 MG-ALBUTEROL 3 MG (2.5 MG BASE)/3 ML NEBULIZATION SOLN
3.0000 mL | INHALATION_SOLUTION | Freq: Once | RESPIRATORY_TRACT | Status: DC | PRN
Start: 2023-01-20 — End: 2023-01-20

## 2023-01-20 MED ORDER — PIPERACILLIN-TAZOBACTAM 3.375 GRAM INTRAVENOUS SOLUTION
INTRAVENOUS | Status: AC
Start: 2023-01-20 — End: 2023-01-20
  Filled 2023-01-20: qty 15

## 2023-01-20 MED ORDER — MAGNESIUM SULFATE 1 GRAM/100 ML IN DEXTROSE 5 % INTRAVENOUS PIGGYBACK
INJECTION | INTRAVENOUS | Status: AC
Start: 2023-01-20 — End: 2023-01-20
  Filled 2023-01-20: qty 200

## 2023-01-20 MED ORDER — VANCOMYCIN IV - PHARMACIST TO DOSE PER PROTOCOL
Freq: Every day | Status: DC | PRN
Start: 2023-01-20 — End: 2023-01-22

## 2023-01-20 MED ORDER — GABAPENTIN 300 MG CAPSULE
300.0000 mg | ORAL_CAPSULE | Freq: Three times a day (TID) | ORAL | Status: DC
Start: 2023-01-20 — End: 2023-01-22
  Administered 2023-01-20: 300 mg via ORAL
  Administered 2023-01-20: 0 mg via ORAL
  Administered 2023-01-21 – 2023-01-22 (×5): 300 mg via ORAL
  Filled 2023-01-20 (×6): qty 1

## 2023-01-20 MED ORDER — SODIUM CHLORIDE 0.9 % INTRAVENOUS PIGGYBACK
INJECTION | INTRAVENOUS | Status: AC
Start: 2023-01-20 — End: 2023-01-20
  Filled 2023-01-20: qty 100

## 2023-01-20 MED ORDER — MAGNESIUM SULFATE 1 GRAM/100 ML IN DEXTROSE 5 % INTRAVENOUS PIGGYBACK
1.0000 g | INJECTION | INTRAVENOUS | Status: AC
Start: 2023-01-20 — End: 2023-01-20
  Administered 2023-01-20 (×2): 1 g via INTRAVENOUS
  Administered 2023-01-20 (×2): 0 g via INTRAVENOUS

## 2023-01-20 MED ORDER — MAGNESIUM SULFATE 1 GRAM/100 ML IN DEXTROSE 5 % INTRAVENOUS PIGGYBACK
1.0000 g | INJECTION | Freq: Once | INTRAVENOUS | Status: DC
Start: 2023-01-20 — End: 2023-01-20

## 2023-01-20 MED ORDER — PROPOFOL 10 MG/ML IV BOLUS
INJECTION | Freq: Once | INTRAVENOUS | Status: DC | PRN
Start: 2023-01-20 — End: 2023-01-20
  Administered 2023-01-20: 200 mg via INTRAVENOUS

## 2023-01-20 MED ORDER — VANCOMYCIN 10 GRAM INTRAVENOUS SOLUTION
20.0000 mg/kg | Freq: Once | INTRAVENOUS | Status: AC
Start: 2023-01-20 — End: 2023-01-20
  Administered 2023-01-20: 1750 mg via INTRAVENOUS
  Administered 2023-01-20: 0 mg via INTRAVENOUS
  Filled 2023-01-20: qty 17.5

## 2023-01-20 MED ORDER — FENTANYL (PF) 50 MCG/ML INJECTION WRAPPER
12.5000 ug | INJECTION | INTRAMUSCULAR | Status: DC | PRN
Start: 2023-01-20 — End: 2023-01-20

## 2023-01-20 MED ORDER — FAMOTIDINE (PF) 20 MG/2 ML INTRAVENOUS SOLUTION
20.0000 mg | Freq: Once | INTRAVENOUS | Status: DC
Start: 2023-01-20 — End: 2023-01-20

## 2023-01-20 MED ORDER — PROCHLORPERAZINE EDISYLATE 10 MG/2 ML (5 MG/ML) INJECTION SOLUTION
5.0000 mg | Freq: Once | INTRAMUSCULAR | Status: DC | PRN
Start: 2023-01-20 — End: 2023-01-20

## 2023-01-20 MED ORDER — FUROSEMIDE 40 MG TABLET
ORAL_TABLET | ORAL | Status: AC
Start: 2023-01-20 — End: 2023-01-20
  Filled 2023-01-20: qty 1

## 2023-01-20 MED ORDER — VANCOMYCIN 10 GRAM INTRAVENOUS SOLUTION
15.0000 mg/kg | Freq: Two times a day (BID) | INTRAVENOUS | Status: DC
Start: 2023-01-21 — End: 2023-01-22
  Administered 2023-01-21 (×4): 1250 mg via INTRAVENOUS
  Administered 2023-01-21 – 2023-01-22 (×4): 0 mg via INTRAVENOUS
  Administered 2023-01-22: 1250 mg via INTRAVENOUS
  Administered 2023-01-22: 0 mg via INTRAVENOUS
  Filled 2023-01-20 (×6): qty 12.5

## 2023-01-20 MED ORDER — ATENOLOL 50 MG TABLET
ORAL_TABLET | ORAL | Status: AC
Start: 2023-01-20 — End: 2023-01-20
  Filled 2023-01-20: qty 1

## 2023-01-20 MED ORDER — HYDROCODONE 7.5 MG-ACETAMINOPHEN 325 MG TABLET
1.0000 | ORAL_TABLET | ORAL | Status: DC | PRN
Start: 2023-01-20 — End: 2023-01-22
  Administered 2023-01-20 – 2023-01-22 (×8): 1 via ORAL
  Filled 2023-01-20 (×8): qty 1

## 2023-01-20 MED ORDER — ONDANSETRON HCL (PF) 4 MG/2 ML INJECTION SOLUTION
INTRAMUSCULAR | Status: AC
Start: 2023-01-20 — End: 2023-01-20
  Filled 2023-01-20: qty 2

## 2023-01-20 MED ORDER — MIDAZOLAM 5 MG/ML INJECTION WRAPPER
2.5000 mg | Freq: Once | INTRAMUSCULAR | Status: DC | PRN
Start: 2023-01-20 — End: 2023-01-20

## 2023-01-20 MED ORDER — ONDANSETRON HCL (PF) 4 MG/2 ML INJECTION SOLUTION
4.0000 mg | Freq: Once | INTRAMUSCULAR | Status: DC | PRN
Start: 2023-01-20 — End: 2023-01-20

## 2023-01-20 MED ORDER — POTASSIUM CHLORIDE 20 MEQ/100ML IN STERILE WATER INTRAVENOUS PIGGYBACK
INJECTION | INTRAVENOUS | Status: AC
Start: 2023-01-20 — End: 2023-01-20
  Filled 2023-01-20: qty 300

## 2023-01-20 MED ORDER — GLUCAGON 1 MG/ML SOLUTION FOR INJECTION
1.0000 mg | Freq: Once | INTRAMUSCULAR | Status: DC | PRN
Start: 2023-01-20 — End: 2023-01-22

## 2023-01-20 MED ORDER — DEXTROSE 40 % ORAL GEL
15.0000 g | ORAL | Status: DC | PRN
Start: 2023-01-20 — End: 2023-01-22

## 2023-01-20 MED ORDER — LACTATED RINGERS INTRAVENOUS SOLUTION
INTRAVENOUS | Status: DC
Start: 2023-01-20 — End: 2023-01-22

## 2023-01-20 MED ORDER — ATENOLOL 50 MG TABLET
50.0000 mg | ORAL_TABLET | Freq: Every day | ORAL | Status: DC
Start: 2023-01-20 — End: 2023-01-22
  Administered 2023-01-20: 0 mg via ORAL
  Administered 2023-01-21 – 2023-01-22 (×2): 50 mg via ORAL
  Filled 2023-01-20 (×2): qty 1

## 2023-01-20 MED ORDER — ATORVASTATIN 10 MG TABLET
20.0000 mg | ORAL_TABLET | Freq: Every evening | ORAL | Status: DC
Start: 2023-01-20 — End: 2023-01-22
  Administered 2023-01-20 – 2023-01-21 (×2): 20 mg via ORAL
  Filled 2023-01-20 (×2): qty 2

## 2023-01-20 MED ORDER — LISINOPRIL 5 MG TABLET
10.0000 mg | ORAL_TABLET | Freq: Every day | ORAL | Status: DC
Start: 2023-01-20 — End: 2023-01-22
  Administered 2023-01-20: 0 mg via ORAL
  Administered 2023-01-21 – 2023-01-22 (×2): 10 mg via ORAL
  Filled 2023-01-20 (×2): qty 2

## 2023-01-20 MED ORDER — PANTOPRAZOLE 40 MG TABLET,DELAYED RELEASE
40.0000 mg | DELAYED_RELEASE_TABLET | Freq: Every day | ORAL | Status: DC
Start: 2023-01-20 — End: 2023-01-22
  Administered 2023-01-20: 0 mg via ORAL
  Administered 2023-01-21 – 2023-01-22 (×2): 40 mg via ORAL
  Filled 2023-01-20 (×2): qty 1

## 2023-01-20 MED ORDER — FAMOTIDINE (PF) 20 MG/2 ML INTRAVENOUS SOLUTION
20.0000 mg | Freq: Once | INTRAVENOUS | Status: AC
Start: 2023-01-20 — End: 2023-01-20
  Administered 2023-01-20: 20 mg via INTRAVENOUS

## 2023-01-20 MED ORDER — SPIRONOLACTONE 25 MG TABLET
100.0000 mg | ORAL_TABLET | Freq: Every morning | ORAL | Status: DC
Start: 2023-01-21 — End: 2023-01-22
  Administered 2023-01-21 – 2023-01-22 (×2): 0 mg via ORAL
  Filled 2023-01-20 (×2): qty 4

## 2023-01-20 MED ORDER — GABAPENTIN 300 MG CAPSULE
ORAL_CAPSULE | ORAL | Status: AC
Start: 2023-01-20 — End: 2023-01-20
  Filled 2023-01-20: qty 1

## 2023-01-20 MED ORDER — SODIUM CHLORIDE 0.9 % (FLUSH) INJECTION SYRINGE
3.0000 mL | INJECTION | INTRAMUSCULAR | Status: DC | PRN
Start: 2023-01-20 — End: 2023-01-20

## 2023-01-20 MED ORDER — PANTOPRAZOLE 40 MG TABLET,DELAYED RELEASE
DELAYED_RELEASE_TABLET | ORAL | Status: AC
Start: 2023-01-20 — End: 2023-01-20
  Filled 2023-01-20: qty 1

## 2023-01-20 MED ORDER — DEXAMETHASONE SODIUM PHOSPHATE 4 MG/ML INJECTION SOLUTION
4.0000 mg | Freq: Once | INTRAMUSCULAR | Status: DC
Start: 2023-01-20 — End: 2023-01-20

## 2023-01-20 MED ORDER — TRAZODONE 50 MG TABLET
50.0000 mg | ORAL_TABLET | Freq: Every evening | ORAL | Status: DC
Start: 2023-01-20 — End: 2023-01-22
  Administered 2023-01-20 – 2023-01-21 (×2): 50 mg via ORAL
  Filled 2023-01-20 (×2): qty 1

## 2023-01-20 MED ORDER — SUCCINYLCHOLINE 20 MG/ML INTRAVENOUS WRAPPER
INJECTION | Freq: Once | INTRAVENOUS | Status: DC | PRN
Start: 2023-01-20 — End: 2023-01-20
  Administered 2023-01-20: 160 mg via INTRAVENOUS

## 2023-01-20 MED ORDER — SODIUM CHLORIDE 0.9 % INTRAVENOUS PIGGYBACK
3.0000 g | Freq: Four times a day (QID) | INTRAVENOUS | Status: DC
Start: 2023-01-20 — End: 2023-01-21
  Administered 2023-01-20 (×3): 3 g via INTRAVENOUS
  Administered 2023-01-20 – 2023-01-21 (×3): 0 g via INTRAVENOUS
  Administered 2023-01-21: 3 g via INTRAVENOUS
  Administered 2023-01-21 (×2): 0 g via INTRAVENOUS
  Administered 2023-01-21: 3 g via INTRAVENOUS
  Administered 2023-01-21: 0 g via INTRAVENOUS
  Filled 2023-01-20 (×4): qty 8

## 2023-01-20 MED ORDER — ONDANSETRON HCL (PF) 4 MG/2 ML INJECTION SOLUTION
4.0000 mg | Freq: Once | INTRAMUSCULAR | Status: AC
Start: 2023-01-20 — End: 2023-01-20
  Administered 2023-01-20: 4 mg via INTRAVENOUS

## 2023-01-20 MED ORDER — PHENYLEPHRINE 10 MG/ML INJECTION SOLUTION
Freq: Once | INTRAMUSCULAR | Status: DC | PRN
Start: 2023-01-20 — End: 2023-01-20
  Administered 2023-01-20 (×2): 100 ug via INTRAVENOUS

## 2023-01-20 MED ORDER — ONDANSETRON HCL (PF) 4 MG/2 ML INJECTION SOLUTION
4.0000 mg | Freq: Once | INTRAMUSCULAR | Status: DC
Start: 2023-01-20 — End: 2023-01-20

## 2023-01-20 MED ORDER — MIDAZOLAM 5 MG/ML INJECTION WRAPPER
INTRAMUSCULAR | Status: AC
Start: 2023-01-20 — End: 2023-01-20
  Filled 2023-01-20: qty 1

## 2023-01-20 MED ORDER — SODIUM CHLORIDE 0.9 % INTRAVENOUS PIGGYBACK
3.3750 g | INJECTION | Freq: Four times a day (QID) | INTRAVENOUS | Status: DC
Start: 2023-01-20 — End: 2023-01-20
  Administered 2023-01-20: 3.375 g via INTRAVENOUS
  Administered 2023-01-20: 0 g via INTRAVENOUS

## 2023-01-20 MED ORDER — HALOPERIDOL LACTATE 5 MG/ML INJECTION SOLUTION
1.0000 mg | Freq: Once | INTRAMUSCULAR | Status: DC | PRN
Start: 2023-01-20 — End: 2023-01-20

## 2023-01-20 MED ORDER — VANCOMYCIN IV - PHARMACIST TO DOSE PER PROTOCOL
Freq: Every day | Status: DC | PRN
Start: 2023-01-20 — End: 2023-01-20

## 2023-01-20 MED ORDER — DEXTROSE 50 % IN WATER (D50W) INTRAVENOUS SYRINGE
12.5000 g | INJECTION | INTRAVENOUS | Status: DC | PRN
Start: 2023-01-20 — End: 2023-01-22

## 2023-01-20 MED ORDER — LISINOPRIL 5 MG TABLET
ORAL_TABLET | ORAL | Status: AC
Start: 2023-01-20 — End: 2023-01-20
  Filled 2023-01-20: qty 2

## 2023-01-20 MED ORDER — AMPICILLIN-SULBACTAM 3 GRAM SOLUTION FOR INJECTION
INTRAMUSCULAR | Status: AC
Start: 2023-01-20 — End: 2023-01-20
  Filled 2023-01-20: qty 8

## 2023-01-20 MED ORDER — FENTANYL (PF) 50 MCG/ML INJECTION WRAPPER
25.0000 ug | INJECTION | INTRAMUSCULAR | Status: DC | PRN
Start: 2023-01-20 — End: 2023-01-20

## 2023-01-20 MED ORDER — CLONIDINE HCL 0.1 MG TABLET
0.1000 mg | ORAL_TABLET | Freq: Two times a day (BID) | ORAL | Status: DC
Start: 2023-01-20 — End: 2023-01-22
  Administered 2023-01-20: 0 mg via ORAL
  Administered 2023-01-20 – 2023-01-22 (×4): 0.1 mg via ORAL
  Filled 2023-01-20 (×4): qty 1

## 2023-01-20 MED ORDER — HYDROMORPHONE 2 MG/ML INJECTION WRAPPER
0.5000 mg | INJECTION | INTRAMUSCULAR | Status: DC | PRN
Start: 2023-01-20 — End: 2023-01-20

## 2023-01-20 MED ORDER — LACTATED RINGERS INTRAVENOUS SOLUTION
INTRAVENOUS | Status: DC
Start: 2023-01-20 — End: 2023-01-20
  Administered 2023-01-20: 0 mL via INTRAVENOUS

## 2023-01-20 MED ORDER — INSULIN LISPRO 100 UNIT/ML SUB-Q SSIP VIAL
0.0000 [IU] | INJECTION | Freq: Four times a day (QID) | SUBCUTANEOUS | Status: DC
Start: 2023-01-20 — End: 2023-01-22
  Administered 2023-01-20 – 2023-01-22 (×9): 0 [IU] via SUBCUTANEOUS

## 2023-01-20 MED ORDER — FAMOTIDINE (PF) 20 MG/2 ML INTRAVENOUS SOLUTION
INTRAVENOUS | Status: AC
Start: 2023-01-20 — End: 2023-01-20
  Filled 2023-01-20: qty 2

## 2023-01-20 SURGICAL SUPPLY — 48 items
BANDAGE SFFRM 75X4IN STRCH FNSH EDGE ABS PLSTR RYN GAUZE STRL LF  DISP (WOUND CARE SUPPLY) ×1 IMPLANT
BLADE 10 2 END CBNSTL SURG STRL DISP (SURGICAL CUTTING SUPPLIES) ×1 IMPLANT
BLADE 15 2 END CBNSTL SURG STRL DISP (SURGICAL CUTTING SUPPLIES) IMPLANT
BLADE SURG CLPR W 37.2MM GP EXIST HNDL GTT IN CHRG .23MM NONST LF  DISP (MED SURG SUPPLIES) IMPLANT
CLEANER ESURG TIP 2X2IN TIP POLISHR CAUT STRL LF (SURGICAL CUTTING SUPPLIES) ×1 IMPLANT
CONTAINR CLICKSEAL 4OZ TRANSLUC SCREW CAP STRL BLU SPECI PNEUM TUBE SYS (SPECIMEN COLLECTION SUPPLIES) ×1 IMPLANT
CONV USE 123874 - SYRINGE AMSURE MDCHC 60CC LF  STRL TIP PRTC SM TUBE ADPR IRRG DISC BULB POLYPROP (MED SURG SUPPLIES) IMPLANT
CONV USE 339323 - COVER TBL 90X50IN STD SMS REINF FNFLD STRL LF  DISP (DRAPE/PACKS/SHEETS/OR TOWEL) ×2
COUNTER 20 CNT BLOCK ADH NEEDLE STRL LF  RD SHARP FOAM 15.75X11.5X14IN DISP (MED SURG SUPPLIES) ×1 IMPLANT
COVER TBL 90X50IN STD SMS REINF FNFLD STRL LF  DISP (DRAPE/PACKS/SHEETS/OR TOWEL) ×2 IMPLANT
CUFF TOURNIQUET PURP 34X4IN COLOR CUF CYL 2 PORT 1 BLADDER QC 40IN STRL LF  DISP (MED SURG SUPPLIES) IMPLANT
CUFF TOURNIQUET RD 18X4IN COLOR CUF CYL 2 PORT BLADDER QC LOW PROF STRL LF  DISP (MED SURG SUPPLIES) IMPLANT
CUFF TOURNIQUET RYL BLU 30X4IN COLOR CUF CYL 2 PORT 1 BLADDER QC LOW PROF 40IN STRL LF  DISP (MED SURG SUPPLIES) IMPLANT
DETERGENT INSTR 22OZ TRNSPT GEL RINSE FREE NEUT PH PREKLENZ CLR PLSNT LF (MISCELLANEOUS PT CARE ITEMS) ×1 IMPLANT
DRAPE FNFLD ABS REINF 77X53IN 43528 PRXM LF  STRL DISP SURG SMS 44X23IN (DRAPE/PACKS/SHEETS/OR TOWEL) ×1 IMPLANT
ELECTRODE ESURG BLADE PNCL 15FT VLAB EDGE TELESCP SMOKE EVAC (SURGICAL CUTTING SUPPLIES) ×1 IMPLANT
GLOVE SURG 6 LF  PF BEAD CUF STRL CRM 11.3IN PROTEXIS PLISPRN THK9.1 MIL (GLOVES AND ACCESSORIES) IMPLANT
GLOVE SURG 6 LF  PF SMOOTH BEAD CUF INTLK STRL BLU 11.3IN PROTEXIS NEU-THERA PLISPRN THK7.9 MIL (GLOVES AND ACCESSORIES) IMPLANT
GLOVE SURG 6.5 LF  PF BEAD CUF STRL CRM 11.3IN PROTEXIS PI PLISPRN THK9.1 MIL (GLOVES AND ACCESSORIES) IMPLANT
GLOVE SURG 6.5 LF  PF SMOOTH BEAD CUF INTLK STRL BLU 11.3IN PROTEXIS NEU-THERA PLISPRN THK7.9 MIL (GLOVES AND ACCESSORIES) ×1 IMPLANT
GLOVE SURG 6.5 LTX PF SMOOTH BEAD CUF STRL YW 11.5IN PROTEXIS NEU-THERA DDRGL THK8.7 MIL (GLOVES AND ACCESSORIES) IMPLANT
GLOVE SURG 7 LF  PF BEAD CUF STRL CRM 11.8IN PROTEXIS PI PLISPRN THK9.1 MIL (GLOVES AND ACCESSORIES) IMPLANT
GLOVE SURG 7 LF  PF SMOOTH BEAD CUF INTLK STRL BLU 11.8IN PROTEXIS NEU-THERA PLISPRN THK7.9 MIL (GLOVES AND ACCESSORIES) IMPLANT
GLOVE SURG 7 LTX PF SMOOTH BEAD CUF STRL YW 12IN PROTEXIS NEU-THERA DDRGL THK8.7 MIL (GLOVES AND ACCESSORIES) IMPLANT
GLOVE SURG 7.5 LF  PF BEAD CUF STRL CRM 11.8IN PROTEXIS PI PLISPRN THK9.1 MIL (GLOVES AND ACCESSORIES) IMPLANT
GLOVE SURG 7.5 LF  PF SMOOTH BEAD CUF INTLK STRL BLU 11.8IN PROTEXIS NEU-THERA PLISPRN THK7.9 MIL (GLOVES AND ACCESSORIES) IMPLANT
GLOVE SURG 7.5 LTX PF SMOOTH BEAD CUF STRL YW 12IN PROTEXIS (GLOVES AND ACCESSORIES) ×1 IMPLANT
GLOVE SURG 8 LF  PF BEAD CUF STRL CRM 11.8IN PROTEXIS PI PLISPRN THK9.1 MIL (GLOVES AND ACCESSORIES) IMPLANT
GLOVE SURG 8 LF  PF SMOOTH BEAD CUF INTLK STRL BLU 11.8IN PROTEXIS NEU-THERA PLISPRN THK7.9 MIL (GLOVES AND ACCESSORIES) IMPLANT
GLOVE SURG 8 LTX PF SMOOTH BEAD CUF STRL YW 12IN PROTEXIS NEU-THERA DDRGL THK8.7 MIL (GLOVES AND ACCESSORIES) IMPLANT
GLOVE SURG 8.5 LF  PF BEAD CUF STRL CRM 11.8IN PROTEXIS PI PLISPRN THK9.1 MIL (GLOVES AND ACCESSORIES) IMPLANT
GOWN SURG LRG STD LGTH L3 HKLP CLSR RGLN SLEEVE TWL STRL LF  DISP GRN AERO BLU PRFRM FBRC (DRAPE/PACKS/SHEETS/OR TOWEL) ×1 IMPLANT
GOWN SURG XL STD LGTH L3 HKLP CLSR RGLN SLEEVE TWL STRL LF  DISP GRN AERO BLU PRFRM FBRC (DRAPE/PACKS/SHEETS/OR TOWEL) ×1 IMPLANT
LABEL MED CORRECT MED LABELING SYS 4 FLG 2 SHEET 24 PRPRNT STRL (MED SURG SUPPLIES) IMPLANT
MAT INSTR TRY 44X36IN WTPRF BACKSHEET TPNX BLU (MISCELLANEOUS PT CARE ITEMS) ×1 IMPLANT
SLEEVE COMPRESS LRG KNEE LGTH KENDALL SCD NONST LF  DISP 26- IN (MED SURG SUPPLIES) IMPLANT
SLEEVE COMPRESS MED KNEE LGTH KENDALL SCD SEQ NONST LF  DISP 21- IN DVT PE (MED SURG SUPPLIES) IMPLANT
SOL IRRG 0.9% NACL 1000ML PLASTIC PR BTL ISTNC N-PYRG STRL LF (MEDICATIONS/SOLUTIONS) ×1 IMPLANT
SPONGE GAUZE 4X4IN MDCHC COTTON 12 PLY TY 7 LF  STRL DISP (WOUND CARE SUPPLY) ×1 IMPLANT
SPONGE SURG 4X4IN 16 PLY XRY DETECT COTTON STRL LF  DISP (WOUND CARE SUPPLY) ×1 IMPLANT
STKNT ORTHO 48X4IN COTTON PLSTR 1 PLY PCUT SEWN END LF  TUB STRL OFF WHT (ORTHOPEDICS (NOT IMPLANTS)) ×1 IMPLANT
SUTURE 2-0 CT2 VICRYL 27IN VIOL BRD COAT ABS (SUTURE/WOUND CLOSURE) IMPLANT
SUTURE 2-0 X-1 VICRYL 27IN UNDYED BRD COAT ABS (SUTURE/WOUND CLOSURE) IMPLANT
SUTURE 3-0 SH VICRYL 27IN VIOL BRD COAT ABS (SUTURE/WOUND CLOSURE) ×1 IMPLANT
SYRINGE AMSURE MDCHC 60CC LF  STRL TIP PRTC SM TUBE ADPR IRRG DISC BULB POLYPROP (MED SURG SUPPLIES)
TOWEL 24X16IN COTTON BLU DISP SURG STRL LF (DRAPE/PACKS/SHEETS/OR TOWEL) ×1 IMPLANT
TUBING SUCT CLR 6FT .25IN ARGYLE PVC NCDTV STR MALE FEMALE MLD CONN STRL LF (MED SURG SUPPLIES) ×1 IMPLANT
WIPE PREP PLMR ADH NONSTING DRESS ADAPT OSTOMY (MED SURG SUPPLIES) IMPLANT

## 2023-01-20 NOTE — OR Surgeon (Addendum)
[facility]      Patient Name: Jeremy Sexton, Jeremy Sexton Number: [id number]  Date of Service: 01/20/2023   Date of Birth: Nov 09, 1955      Pre-Operative Diagnosis: Gangrene right great toe     Post-Operative Diagnosis: Gangrene right great toe    Procedure(s)/Description:  RIGHT GREAT TOE AMPUTATION:  Transmetatarsal    Attending Surgeon: Esmond Plants, MD     Anesthesia:  Anesthesiologist: Nydia Bouton, DO  CRNA: Orion Modest, CRNA    Anesthesia Type: .General     Estimated Blood Loss:  less than 50 ml    Patient was taken to the operating room.  General anesthesia introduced without difficulty.  Right foot prepped and draped in usual sterile fashion.  Elliptical incision made around the base of the right great toe including all of the gangrenous soft tissue along the plantar surface.  Electrocautery was used to transect this down through the metatarsal phalangeal joint space.  The head of the metatarsal was then transected using micro oscillating saw.  The remaining wound bed was without any significant devitalized tissue.  One digital artery required suture ligation.    Assured to be hemostatic.  Packed with Betadine-soaked gauze.  Tolerated without difficulty.        Maura Crandall MD MBA CPE FACS

## 2023-01-20 NOTE — Care Plan (Signed)
Patient admitted for right great toe infection. Patient is awaiting to have toe amputated. Patient is receiving IV abx for infection. Patient ambulating to bathroom. Education is ongoing.  Problem: Adult Inpatient Plan of Care  Goal: Patient-Specific Goal (Individualized)  01/20/2023 1610 by Mariel Sleet, RN  Outcome: Ongoing (see interventions/notes)  01/20/2023 1608 by Mariel Sleet, RN  Outcome: Ongoing (see interventions/notes)  Flowsheets (Taken 01/20/2023 1500)  Individualized Care Needs: Monitor labs and vital and administer abx.  Anxieties, Fears or Concerns: Surgery to remove toe  Patient-Specific Goals (Include Timeframe): Discharge home and patient will have minimal pain.

## 2023-01-20 NOTE — ED Provider Notes (Signed)
[facility]  Emergency Department  Attending Provider Note      CHIEF COMPLAINT  Chief Complaint   Patient presents with    Foot Injury     Diabetic ulcer       HISTORY OF PRESENT ILLNESS  Jeremy Sexton, date of birth [birth year], is a 67 y.o. male who presented to the Emergency Department.    PATIENT PRESENTS EMERGENCY DEPARTMENT TODAY WITH A CHIEF COMPLAINT OF SOME INCREASED REDNESS TO THE RIGHT FOOT.  SYMPTOMS WORSE OVER THE LAST 4 DAYS SECONDARY TO A WOUND.  PATIENT HAS A DUSKY APPEARANCE TO THE GREAT TOE AREA.  DECREASED CAPILLARY REFILL AND CIRCULATION NOTED.  NO FEVERS CHILLS CHEST PAIN PALPITATIONS SHORTNESS OF BREATH COUGH DYSURIA HEMATURIA NAUSEA VOMITING.  COMPREHENSIVE 10+ REVIEW OF SYSTEMS IS OTHERWISE NEGATIVE.  THERE ARE NO OTHER ACUTE COMPLAINTS REPORTED BY THE PATIENT.    PAST MEDICAL/SURGICAL/FAMILY/SOCIAL HISTORY  Past Medical History:   Diagnosis Date    Diabetes mellitus, type 2 (CMS HCC)     High cholesterol     HTN (hypertension)        Past Surgical History:   Procedure Laterality Date    HX BACK SURGERY      HX TONSILLECTOMY         Family Medical History:    None       Social History     Socioeconomic History    Marital status: Widowed   Tobacco Use    Smoking status: Never     Passive exposure: Never    Smokeless tobacco: Never   Substance and Sexual Activity    Alcohol use: Never     Comment: Previous, quit 1 year ago    Drug use: Never      ALLERGIES  Allergies   Allergen Reactions    Tetracyclines Rash    Tetracycline        PHYSICAL EXAM  VITAL SIGNS:  Filed Vitals:    01/20/23 0807   BP: 128/65   Pulse: 62   Resp: 18   Temp: (!) 35.6 C (96.1 F)   SpO2: 99%     GENERAL: PATIENT IS ALERT AND ORIENTED TO PERSON, PLACE, AND TIME.  HEAD: NORMOCEPHALIC AND ATRAUMATIC.  EYES: PUPILS EQUALLY ROUND AND REACT TO LIGHT. EXTRAOCULAR MOVEMENTS INTACT.  EARS: GROSS HEARING INTACT. EXTERNAL EARS WITHIN NORMAL LIMITS.  NOSE: NO SEPTAL DEVIATION. NASAL PASSAGES CLEAR.  THROAT:  MOIST ORAL MUCOSA.   NECK: SUPPLE. TRACHEA MIDLINE.  HEART: REGULAR, RATE, AND RHYTHM.  LUNGS: CLEAR TO AUSCULTATION BILATERAL BUT DECREASED.  ABDOMEN: SOFT, NON-TENDER, NON-DISTENDED, AND BOWEL SOUNDS ARE PRESENT.  GENITOURINARY: DEFERRED.  RECTAL: DEFERRED.  EXTREMITIES: NO CYANOSIS, CLUBBING, OR EDEMA.  SKIN: WARM AND DRY.  MUSCULOSKELETAL:  CELLULITIS OF THE RIGHT FOOT.  SOME CIRCULATORY COMPROMISE AND DUSKY APPEARANCE OF THE RIGHT GREAT TOE.  NEUROLOGIC: CRANIAL NERVES II THROUGH XII ARE GROSSLY INTACT. SENSATION TO LIGHT TOUCH IS INTACT.  PSYCHIATRIC: JUDGMENT AND INSIGHT ARE SEEMINGLY INTACT. MOOD AND AFFECT ARE APPROPRIATE FOR THE SITUATION.    DIAGNOSTICS  Labs:  Labs listed below were reviewed and interpreted by me.  Results for orders placed or performed during the hospital encounter of 01/20/23   PT/INR   Result Value Ref Range    PROTHROMBIN TIME 16.9 (H) 9.8 - 12.7 seconds    INR 1.44 (H) 0.84 - 1.10   PTT (PARTIAL THROMBOPLASTIN TIME)   Result Value Ref Range    APTT 34.1 25.0 - 38.0 seconds  TROPONIN-I   Result Value Ref Range    TROPONIN I 115 (H) <20 ng/L   COMPREHENSIVE METABOLIC PANEL, NON-FASTING   Result Value Ref Range    SODIUM 140 136 - 145 mmol/L    POTASSIUM 2.7 (LL) 3.5 - 5.1 mmol/L    CHLORIDE 103 98 - 107 mmol/L    CO2 TOTAL 27 21 - 31 mmol/L    ANION GAP 10 4 - 13 mmol/L    BUN 33 (H) 7 - 25 mg/dL    CREATININE 4.78 2.95 - 1.30 mg/dL    BUN/CREA RATIO 36 (H) 6 - 22    ESTIMATED GFR 93 >59 mL/min/1.20m^2    ALBUMIN 3.7 3.5 - 5.7 g/dL    CALCIUM 9.5 8.6 - 62.1 mg/dL    GLUCOSE 308 (H) 74 - 109 mg/dL    ALKALINE PHOSPHATASE 93 34 - 104 U/L    ALT (SGPT) 10 7 - 52 U/L    AST (SGOT) 10 (L) 13 - 39 U/L    BILIRUBIN TOTAL 1.3 (H) 0.3 - 1.2 mg/dL    PROTEIN TOTAL 6.3 (L) 6.4 - 8.9 g/dL    ALBUMIN/GLOBULIN RATIO 1.4 0.8 - 1.4    OSMOLALITY, CALCULATED 289 270 - 290 mOsm/kg    CALCIUM, CORRECTED 9.7 8.9 - 10.8 mg/dL    GLOBULIN 2.6 (L) 2.9 - 5.4   MAGNESIUM   Result Value Ref Range    MAGNESIUM  1.7 (L) 1.9 - 2.7 mg/dL   LACTIC ACID LEVEL W/ REFLEX FOR LEVEL >2.0   Result Value Ref Range    LACTIC ACID 1.3 0.5 - 2.2 mmol/L   THYROID STIMULATING HORMONE WITH FREE T4 REFLEX   Result Value Ref Range    TSH 1.125 0.450 - 5.330 uIU/mL   CBC WITH DIFF   Result Value Ref Range    WBC 11.4 (H) 3.6 - 10.2 x10^3/uL    RBC 3.59 (L) 4.06 - 5.63 x10^6/uL    HGB 11.0 (L) 12.5 - 16.3 g/dL    HCT 65.7 (L) 84.6 - 47.1 %    MCV 90.4 73.0 - 96.2 fL    MCH 30.6 23.8 - 33.4 pg    MCHC 33.9 32.5 - 36.3 g/dL    RDW 96.2 95.2 - 84.1 %    PLATELETS 153 140 - 440 x10^3/uL    MPV 8.5 7.4 - 11.4 fL    NEUTROPHIL % 76 43 - 77 %    LYMPHOCYTE % 13 (L) 16 - 44 %    MONOCYTE % 9 5 - 13 %    EOSINOPHIL % 2 %    BASOPHIL % 0 0 - 1 %    NEUTROPHIL # 8.70 (H) 1.85 - 7.80 x10^3/uL    LYMPHOCYTE # 1.50 1.00 - 3.00 x10^3/uL    MONOCYTE # 1.00 0.30 - 1.00 x10^3/uL    EOSINOPHIL # 0.20 0.00 - 0.50 x10^3/uL    BASOPHIL # 0.00 0.00 - 0.10 x10^3/uL   HGA1C (HEMOGLOBIN A1C WITH EST AVG GLUCOSE)   Result Value Ref Range    HEMOGLOBIN A1C 5.2 4.0 - 6.0 %   ECG 12 LEAD   Result Value Ref Range    Ventricular rate 66 BPM    Atrial Rate 66 BPM    PR Interval 130 ms    QRS Duration 82 ms    QT Interval 408 ms    QTC Calculation 427 ms    Calculated P Axis 0 degrees    Calculated R Axis 28 degrees  Calculated T Axis 25 degrees     Radiology:  Results for orders placed or performed during the hospital encounter of 01/20/23   XR CHEST AP     Status: None    Narrative    Delma W Massie Maroon    RADIOLOGIST: Karolee Stamps    XR CHEST AP performed on 01/20/2023 8:51 AM    CLINICAL HISTORY: LOW TEMP/ SEPSIS EVAL.  low temp/sepsis eval    TECHNIQUE: Frontal view of the chest.    COMPARISON:  11/17/2022    FINDINGS:  The lungs remain symmetrically inflated. The trachea remains at midline.   The cardiac silhouette remains normal in size stable from prior study.   Bronchovascular lung markings are not significantly changed and remain within limits of normal. No airspace  consolidation or pleural effusion is identified on single view. No mediastinal shift or pneumothorax is seen. An approximately 5 mm possible calcified granulomas about the right parahilar region is unchanged.      Impression    No acute findings identified on single view when compared to 11/17/2022.        Radiologist location ID: [id number]     XR FOOT RIGHT     Status: None    Narrative    Hersh W Bartolotta    RADIOLOGIST: Luciano Cutter    XR FOOT RIGHT performed on 01/20/2023 8:50 AM    CLINICAL HISTORY: ULCER.  Right foot pain/ulcer.    TECHNIQUE:  3 views of the right foot.    COMPARISON:  None.    FINDINGS:     No acute fracture is seen. No definite destructive bony lesions are seen. There is probably a bipartite medial sesamoid at the first MTP joint. There is a small smooth cystic-appearing structure at the medial distal first metatarsal.  No dislocation or acute joint space abnormality is seen.  There is some soft tissue swelling in the medial foot. There are some probable gas bubbles along the first metatarsal extending into the great toe. There may be some gas in the nailbed region, along with some soft tissue irregularity.   On lateral, there is anterior and mild posterior circulation atherosclerotic changes. There are minimal calcaneal spurs. There is probably some calcification in the posterior plantar fascia. Lateral suggests a defect in the dorsum of the distal great toe.      Impression    No acute right foot fracture or dislocation is demonstrated.    No definite destructive lesion seen to suggest osteomyelitis at this time. MR with contrast is most sensitive and specific for this evaluation.    There is soft tissue swelling and gas along the first metatarsal extending into the great toe. There may be a frank ulcer at the dorsum of the distal great toe.    Anterior circulation atherosclerotic changes, and minimal posterior.    Minimal calcaneal spurs. There is also minimal calcification in the  posterior plantar fascial.    Note: CT right foot is pending. Please see report of that study for further assessment.            Radiologist location ID: [id number]        Radiologist location ID: [id number]     CT FOOT RIGHT WO IV CONTRAST     Status: None    Narrative    Chaston W Righetti    RADIOLOGIST: Truman Hayward, MD    CT FOOT RIGHT WO IV CONTRAST performed on 01/20/2023 9:02 AM    CLINICAL HISTORY: R/O  OSTEO.      R/O OSTEO, wound came up 1 week ago pt is diabetic    TECHNIQUE:  Right foot CT without intravenous contrast.      COMPARISON: Right foot radiographs today      FINDINGS:  There are areas of ulceration along the medial and lateral aspect of the first toe. There is soft tissue gas along the medial toe best seen on coronal image 29 and 34. Lateral soft tissue gas is seen on axial image 58. There are adjacent ulcerations favoring the etiology of the soft tissue gas, but gas-forming infection cannot be entirely excluded. Diffuse soft tissue swelling suggests cellulitis.    Within the limits of CT, no bony destruction at this time to suggest osteomyelitis. Please note that MRI is most sensitive for detection of early changes of osteomyelitis when clinically suspected.    The first through third toe nails of the right foot are dorsally oriented. No fracture.        Impression    DIFFUSE SOFT TISSUE SWELLING SUGGESTING CELLULITIS. THERE ARE ALSO SOFT TISSUE ULCERATIVE CHANGES ALONG THE MEDIAL AND LATERAL FIRST TOE WITH SUBCUTANEOUS SOFT TISSUE GAS.    NO CT EVIDENCE OF BONY DESTRUCTION AT THIS TIME.      One or more dose reduction techniques were used (e.g., Automated exposure control, adjustment of the mA and/or kV according to patient size, use of iterative reconstruction technique).      Radiologist location ID: [id number]         ED COURSE/MEDICAL DECISION MAKING  Medications Administered in the ED   Vancomycin IV - Pharmacist to Dose per Protocol (has no administration in time range)    piperacillin-tazobactam (ZOSYN) 3.375 g in NS 100 mL IVPB minibag (3.375 g Intravenous New Bag/New Syringe 01/20/23 0940)   vancomycin (VANCOCIN) 1,750 mg in NS 500 mL IVPB (has no administration in time range)   magnesium sulfate 1 G in D5W 100 mL premix IVPB (has no administration in time range)   potassium chloride 20 mEq in SW 100 mL premix infusion (has no administration in time range)   potassium chloride 20 mEq in SW 100 mL premix infusion (has no administration in time range)   potassium chloride 20 mEq in SW 100 mL premix infusion (has no administration in time range)      ED Course as of 01/20/23 1043   Tue Jan 20, 2023   0855 ECG 12 LEAD  TODAY I, Unita Detamore A. Beulah Matusek, D.O., PERSONALLY VISUALIZED THE PATIENT'S EKG TRACING.  I AM THE PRIMARY INTERPRETER.  NORMAL SINUS RHYTHM AT A RATE OF 66.  NO AV BLOCKS.  NORMAL AXIS.  BASELINE WANDERING WITH ARTIFACT.  NONSPECIFIC ST-T CHANGES.  NO LVH.  NO BUNDLE-BRANCH BLOCKS.  PACS NOTED.   1027 POTASSIUM(!!): 2.7  LOW   1027 MAGNESIUM(!): 1.7  LOW   1027 LACTIC ACID: 1.3  NORMAL   1040 XR CHEST AP  TODAY I, Chaselyn Nanney A. Imraan Wendell, D.O., PERSONALLY VISUALIZED THE PATIENT'S DIAGNOSTIC IMAGING STUDIES.  DEFER TO THE RADIOLOGIST'S REPORT FOR THE FINAL DEFINITIVE RADIOLOGY READING.  MY INITIAL INDEPENDENT INTERPRETATION IS AS FOLLOWS, BUT NOT LIMITED TO:  CHRONIC CHANGES        Medical Decision Making  Problems Addressed:  Cellulitis of right foot: acute illness or injury  Hypokalemia: acute illness or injury  Hypomagnesemia: acute illness or injury  Type 2 diabetes mellitus (CMS HCC): chronic illness or injury    Amount and/or Complexity of Data Reviewed  Labs: ordered.  Decision-making details documented in ED Course.  Radiology: ordered and independent interpretation performed. Decision-making details documented in ED Course.  ECG/medicine tests: ordered and independent interpretation performed. Decision-making details documented in ED Course.    Risk  Prescription drug  management.  Drug therapy requiring intensive monitoring for toxicity.  Decision regarding hospitalization.  Diagnosis or treatment significantly limited by social determinants of health.  Elective major surgery with identified risk factors.  Risk Details: SPOKE WITH DR. Sabino Gasser WHO WILL EVALUATE THE PATIENT AND FEELS PATIENT MAY NEED SURGICAL INTERVENTION.    Critical Care  Total time providing critical care: 35 minutes    Critical Care Attestation:  As the ED physician, I have provided critical care for this patient.  This patient has high probability of imminent life or limb threatening deterioration due to  DECREASED CIRCULATION OF RIGHT FOOT WITH CELLULITIS REQUIRING LIKELY SURGICAL INTERVENTION .  I provided direct patient care, documentation of findings, review of medical records, discussion with patient/family, interpretation of EKG, interpretation of chest x-ray, and coordination of multidisciplinary care and frequent reassessment.  Time spent providing critical care (exclusive of any billed procedures and/or teaching): 35 minutes.    CLINICAL IMPRESSION  Clinical Impression   Cellulitis of right foot - WITH CIRCULATORY COMPROMISE OF GREAT TOE (Primary)   Hypokalemia   Hypomagnesemia   Type 2 diabetes mellitus (CMS HCC)     DISPOSITION  Admitted       DISCHARGE MEDICATIONS  Current Discharge Medication List          /Tyanna Hach A. Earlene Plater, DO, MBA   01/20/2023, 07:55   [facility]  Department of Emergency Medicine  [facility]    This note was partially generated using MModal Fluency Direct system, and there may be some incorrect words, spellings, and punctuation that were not noted in checking the note before saving.

## 2023-01-20 NOTE — ED Triage Notes (Signed)
Patient c/o open ulceration to the right foot below the great toe. Patient states " It started out as a blister four days ago and now it is this." The foot has an open wound on the under side under the great toe. The wound bed is black in color with a fowl odor. Patients great toe is purple in color with no capillary refill, it is also cold to the touch. The top of the patients foot is red in color and swollen. Patient is a diabetic and has no feeling in the great toe or the foot.

## 2023-01-20 NOTE — Anesthesia Preprocedure Evaluation (Signed)
ANESTHESIA PRE-OP EVALUATION  Planned Procedure: RIGHT GREAT TOE AMPUTATION (Right: Toe)  Review of Systems     anesthesia history negative     patient summary reviewed  nursing notes reviewed        Pulmonary  negative pulmonary ROS,    Cardiovascular    Hypertension and well controlled , Exercise Tolerance: > or = 4 METS        GI/Hepatic/Renal    GERD        Endo/Other      type 2 diabetes/ stable    Neuro/Psych/MS   negative neuro/psych ROS,      Cancer    negative hematology/oncology ROS,                     Physical Assessment      Airway       Mallampati: II      Mouth Opening: good.            Dental           (+) edentulous           Pulmonary           Cardiovascular             Other findings              Plan  ASA 3     Planned anesthesia type: general     general anesthesia with endotracheal tube intubation      PONV Plan:  I plan to administer pharmcologic prophalaxis antiemetics  POV PLAN:   plan for postoperative opioid use                  Anesthetic plan and risks discussed with patient           Patient's NPO status is appropriate for Anesthesia.  NPO Status: Full stomach precautions.

## 2023-01-20 NOTE — Consults (Signed)
[facility]  General Surgery  Consultation    Date of Service:  01/20/2023  Jeremy Sexton, 67 y.o. male  Date of Admission:  01/20/2023  Date of Birth:  1956/05/19  PCP: Mliss Sax, DO    Reason for Consultation:  Right great toe diabetic ulceration    HPI:  Jeremy Sexton is a 67 y.o. White male who is admitted for evaluation of a right great toe diabetic ulceration that has been present for least the past 5 days.  States it has progressively worsened over the course of that time to the point where he finally came to the emergency room.    Denies any trauma to this area.  Denies any previous diabetic ulceration.  Typically states his diabetes is controlled but does have significant diabetic neuropathy with no significant degree of sensation associated with either foot.  No known diagnosis of peripheral vascular disease in the past.    Past Medical History:   Diagnosis Date    Diabetes mellitus, type 2 (CMS HCC)     High cholesterol     HTN (hypertension)       Past Surgical History:   Procedure Laterality Date    HX BACK SURGERY      HX TONSILLECTOMY        Social History     Tobacco Use    Smoking status: Never     Passive exposure: Never    Smokeless tobacco: Never   Vaping Use    Vaping status: Never Used   Substance Use Topics    Alcohol use: Never     Comment: Previous, quit 1 year ago    Drug use: Never       Family Medical History:    None        Medications Prior to Admission       Prescriptions    acetaminophen (TYLENOL) 500 mg Oral Tablet    Take 1 Tablet (500 mg total) by mouth Every 4 hours as needed for Pain    atenoloL (TENORMIN) 50 mg Oral Tablet    Take 1 Tablet (50 mg total) by mouth Once a day    atorvastatin (LIPITOR) 20 mg Oral Tablet    Take 1 Tablet (20 mg total) by mouth Every evening    cloNIDine HCL (CATAPRES) 0.1 mg Oral Tablet    Take 1 Tablet (0.1 mg total) by mouth Twice daily    furosemide (LASIX) 20 mg Oral Tablet    Take 1 Tablet (20 mg total) by mouth Once a day     gabapentin (NEURONTIN) 300 mg Oral Capsule    Take 1 Capsule (300 mg total) by mouth Three times a day    lisinopriL (PRINIVIL) 10 mg Oral Tablet    Take 1 Tablet (10 mg total) by mouth Once a day    metFORMIN (GLUCOPHAGE) 500 mg Oral Tablet    Take 1 Tablet (500 mg total) by mouth Twice daily with food    omeprazole (PRILOSEC) 40 mg Oral Capsule, Delayed Release(E.C.)    Take 1 Capsule (40 mg total) by mouth Once a day    spironolactone (ALDACTONE) 100 mg Oral Tablet    Take 1 Tablet (100 mg total) by mouth Every morning with breakfast    traZODone (DESYREL) 50 mg Oral Tablet    Take 1 Tablet (50 mg total) by mouth Every night           Allergies   Allergen Reactions  Tetracyclines Rash    Tetracycline           Patient Vitals for the past 24 hrs:   BP Temp Pulse Resp SpO2 Height Weight   01/20/23 1435 (!) 168/77 36.7 C (98.1 F) 81 16 99 % 1.829 m (6' 0.01") 88.5 kg (195 lb)   01/20/23 1433 -- -- 80 -- -- -- --   01/20/23 1300 136/78 -- 75 12 98 % -- --   01/20/23 1245 -- -- 74 15 98 % -- --   01/20/23 1230 -- -- 68 12 97 % -- --   01/20/23 1215 -- -- 69 15 98 % -- --   01/20/23 1200 131/74 -- 70 12 99 % -- --   01/20/23 1145 -- -- 65 14 100 % -- --   01/20/23 1130 -- -- 65 (!) 9 99 % -- --   01/20/23 1115 -- -- 62 (!) 11 98 % -- --   01/20/23 1100 -- -- 65 (!) 9 100 % -- --   01/20/23 1045 -- -- 69 15 99 % -- --   01/20/23 1030 -- -- 68 15 100 % -- --   01/20/23 0807 128/65 (!) 35.6 C (96.1 F) 62 18 99 % 1.829 m (6') 88.5 kg (195 lb)          General: appropriate for age. in no acute distress.    Vital signs are present above and have been reviewed by me     HEENT: Atraumatic, Normocephalic.    Lungs: Nonlabored breathing with symmetric expansion    Heart:Regular wth respect to rate     Abdomen:Soft. Nontender. Nondistended     Right foot with gangrenous changes over the  metatarsophalangeal joint.  Gangrenous changes extended onto the great toe.  Moderate amount of induration surrounding the open area.  No  expressible drainage.    Does have palpable posterior tibial and dorsalis pedis pulses of that foot.        Psychiatric: Alert and oriented to person, place, and time. affect appropriate    Laboratory Data:     Results for orders placed or performed during the hospital encounter of 01/20/23 (from the past 24 hour(s))   ECG 12 LEAD   Result Value Ref Range    Ventricular rate 66 BPM    Atrial Rate 66 BPM    PR Interval 130 ms    QRS Duration 82 ms    QT Interval 408 ms    QTC Calculation 427 ms    Calculated P Axis 0 degrees    Calculated R Axis 28 degrees    Calculated T Axis 25 degrees   PT/INR   Result Value Ref Range    PROTHROMBIN TIME 16.9 (H) 9.8 - 12.7 seconds    INR 1.44 (H) 0.84 - 1.10   PTT (PARTIAL THROMBOPLASTIN TIME)   Result Value Ref Range    APTT 34.1 25.0 - 38.0 seconds   TROPONIN-I   Result Value Ref Range    TROPONIN I 115 (H) <20 ng/L   COMPREHENSIVE METABOLIC PANEL, NON-FASTING   Result Value Ref Range    SODIUM 140 136 - 145 mmol/L    POTASSIUM 2.7 (LL) 3.5 - 5.1 mmol/L    CHLORIDE 103 98 - 107 mmol/L    CO2 TOTAL 27 21 - 31 mmol/L    ANION GAP 10 4 - 13 mmol/L    BUN 33 (H) 7 - 25 mg/dL    CREATININE 1.61 0.96 -  1.30 mg/dL    BUN/CREA RATIO 36 (H) 6 - 22    ESTIMATED GFR 93 >59 mL/min/1.70m^2    ALBUMIN 3.7 3.5 - 5.7 g/dL    CALCIUM 9.5 8.6 - 62.9 mg/dL    GLUCOSE 528 (H) 74 - 109 mg/dL    ALKALINE PHOSPHATASE 93 34 - 104 U/L    ALT (SGPT) 10 7 - 52 U/L    AST (SGOT) 10 (L) 13 - 39 U/L    BILIRUBIN TOTAL 1.3 (H) 0.3 - 1.2 mg/dL    PROTEIN TOTAL 6.3 (L) 6.4 - 8.9 g/dL    ALBUMIN/GLOBULIN RATIO 1.4 0.8 - 1.4    OSMOLALITY, CALCULATED 289 270 - 290 mOsm/kg    CALCIUM, CORRECTED 9.7 8.9 - 10.8 mg/dL    GLOBULIN 2.6 (L) 2.9 - 5.4   C-REACTIVE PROTEIN (CRP)   Result Value Ref Range    C-REACTIVE PROTEIN (CRP) 33.8 (H) 0.1 - 0.5 mg/dL   MAGNESIUM   Result Value Ref Range    MAGNESIUM 1.7 (L) 1.9 - 2.7 mg/dL   LACTIC ACID LEVEL W/ REFLEX FOR LEVEL >2.0   Result Value Ref Range    LACTIC ACID 1.3 0.5 -  2.2 mmol/L   THYROID STIMULATING HORMONE WITH FREE T4 REFLEX   Result Value Ref Range    TSH 1.125 0.450 - 5.330 uIU/mL   CBC WITH DIFF   Result Value Ref Range    WBC 11.4 (H) 3.6 - 10.2 x10^3/uL    RBC 3.59 (L) 4.06 - 5.63 x10^6/uL    HGB 11.0 (L) 12.5 - 16.3 g/dL    HCT 41.3 (L) 24.4 - 47.1 %    MCV 90.4 73.0 - 96.2 fL    MCH 30.6 23.8 - 33.4 pg    MCHC 33.9 32.5 - 36.3 g/dL    RDW 01.0 27.2 - 53.6 %    PLATELETS 153 140 - 440 x10^3/uL    MPV 8.5 7.4 - 11.4 fL    NEUTROPHIL % 76 43 - 77 %    LYMPHOCYTE % 13 (L) 16 - 44 %    MONOCYTE % 9 5 - 13 %    EOSINOPHIL % 2 %    BASOPHIL % 0 0 - 1 %    NEUTROPHIL # 8.70 (H) 1.85 - 7.80 x10^3/uL    LYMPHOCYTE # 1.50 1.00 - 3.00 x10^3/uL    MONOCYTE # 1.00 0.30 - 1.00 x10^3/uL    EOSINOPHIL # 0.20 0.00 - 0.50 x10^3/uL    BASOPHIL # 0.00 0.00 - 0.10 x10^3/uL   HGA1C (HEMOGLOBIN A1C WITH EST AVG GLUCOSE)   Result Value Ref Range    HEMOGLOBIN A1C 5.2 4.0 - 6.0 %       Imaging Studies:    CT FOOT RIGHT WO IV CONTRAST   Final Result by Edi, Radresults In (06/25 0914)   DIFFUSE SOFT TISSUE SWELLING SUGGESTING CELLULITIS. THERE ARE ALSO SOFT TISSUE ULCERATIVE CHANGES ALONG THE MEDIAL AND LATERAL FIRST TOE WITH SUBCUTANEOUS SOFT TISSUE GAS.      NO CT EVIDENCE OF BONY DESTRUCTION AT THIS TIME.         One or more dose reduction techniques were used (e.g., Automated exposure control, adjustment of the mA and/or kV according to patient size, use of iterative reconstruction technique).         Radiologist location ID: [id number]         XR CHEST AP   Final Result by Edi, Radresults In (06/25 0857)   No  acute findings identified on single view when compared to 11/17/2022.            Radiologist location ID: [id number]         XR FOOT RIGHT   Final Result by Edi, Radresults In (06/25 1023)      No acute right foot fracture or dislocation is demonstrated.      No definite destructive lesion seen to suggest osteomyelitis at this time. MR with contrast is most sensitive and specific for  this evaluation.      There is soft tissue swelling and gas along the first metatarsal extending into the great toe. There may be a frank ulcer at the dorsum of the distal great toe.      Anterior circulation atherosclerotic changes, and minimal posterior.      Minimal calcaneal spurs. There is also minimal calcification in the posterior plantar fascial.      Note: CT right foot is pending. Please see report of that study for further assessment.                  Radiologist location ID: [id number]            Radiologist location ID: [id number]              Assessment/Plan:  Active Hospital Problems    Diagnosis    Primary Problem: Cellulitis    Cellulitis of right foot       Patient needs urgent debridement/1st toe amputation secondary to what appears to be necrotizing soft tissue infection of his right foot.  Patient appears to have limited options for great toe salvage at this point.  He voices understanding of this.  All questions answered to his satisfaction.  Informed consent clearly obtained.  He was advised that if the soft tissue infection could not be control that he potentially is still a risk for limb loss.  All questions answered to his satisfaction.  Informed consent clearly obtained this plan.    This note was partially created using voice recognition software and is inherently subject to errors including those of syntax and "sound alike " substitutions which may escape proof reading. In such instances, original meaning may be extrapolated by contextual derivation.    Esmond Plants, MD

## 2023-01-20 NOTE — OR PreOp (Signed)
1320: PATIENT INFORMED ANESTHESIOLOGIST THAT He ATE A ROLL FROM LUNCH TRAY AROUND 1130 THIS AM. DR. PARKER SPOKE WITH DR. Sabino Gasser. PATIENT INFORMED THAT DUE TO EATING PATIENT'S SURGERY WILL NEED TO BE DELAYED UNTIL AROUND 5:30PM.   1430: PATIENT ADMITTED; ROOM AVAILABLE ON [address]. FLOOR NOTIFIED PER T. [place], RN OF PATIENT SURGERY BEING DELAYED AND PATIENT COMING TO THEIR FLOOR. PATIENT TRANSFERRED VIA STRETCHER WITH [facility] AND NURSE IN ACCOMPANIMENT. REPORT GIVEN TO U.MACK,RN.

## 2023-01-20 NOTE — Ancillary Notes (Signed)
Patient has surgical consult for right foot. Please see Surgical consult plan and orders.

## 2023-01-20 NOTE — Anesthesia Transfer of Care (Signed)
ANESTHESIA TRANSFER OF CARE   SAW Jeremy Sexton is a 67 y.o. ,male, Weight: 88.5 kg (195 lb)   had Procedure(s):  RIGHT GREAT TOE AMPUTATION  performed  01/20/23   Primary Service: Louis Meckel, MD    Past Medical History:   Diagnosis Date    Diabetes mellitus, type 2 (CMS HCC)     High cholesterol     HTN (hypertension)       Allergy History as of 01/20/23       TETRACYCLINE         Noted Status Severity Type Reaction    11/17/22 1148 Young, [place], RN 01/14/17 Active                 TETRACYCLINES         Noted Status Severity Type Reaction    01/20/23 [address], RN 01/20/23 Active Medium  Rash                  I completed my transfer of care / handoff to the receiving personnel during which we discussed:  Access, Airway, All key/critical aspects of case discussed, Analgesia, Antibiotics, Expectation of post procedure, Fluids/Product, Gave opportunity for questions and acknowledgement of understanding, Labs and PMHx  Report given to: Marlana Latus, RN    Post Location: PACU                                                             Last OR Temp: Temperature: 36.4 C (97.6 F)  ABG:  POTASSIUM   Date Value Ref Range Status   01/20/2023 2.7 (LL) 3.5 - 5.1 mmol/L Final     KAPPA FREE LIGHT CHAINS   Date Value Ref Range Status   01/15/2023 1.82 1.25 - 3.25 mg/dL Final   08/65/7846 9.62 1.25 - 3.25 mg/dL Final     KAPPA/LAMBDA FLC RATIO   Date Value Ref Range Status   01/15/2023 1.17 0.80 - 2.10 Final   01/15/2023 1.17 0.80 - 2.10 Final     CALCIUM   Date Value Ref Range Status   01/20/2023 9.5 8.6 - 10.3 mg/dL Final     Calculated P Axis   Date Value Ref Range Status   01/20/2023 0 degrees Incomplete     Calculated R Axis   Date Value Ref Range Status   01/20/2023 28 degrees Incomplete     Calculated T Axis   Date Value Ref Range Status   01/20/2023 25 degrees Incomplete     Airway:* No LDAs found *  Blood pressure (!) 176/81, pulse 87, temperature 36.4 C (97.6 F), resp. rate 14, height 1.829 m (6' 0.01"),  weight 88.5 kg (195 lb), SpO2 100%.

## 2023-01-20 NOTE — Anesthesia Postprocedure Evaluation (Signed)
Anesthesia Post Op Evaluation    Patient: Jeremy Sexton  Procedure(s):  RIGHT GREAT TOE AMPUTATION    Last Vitals:Temperature: 36.4 C (97.6 F) (01/20/23 1929)  Heart Rate: 87 (01/20/23 1940)  BP (Non-Invasive): (!) 163/81 (01/20/23 1940)  Respiratory Rate: (!) 21 (01/20/23 1940)  SpO2: 94 % (01/20/23 1940)    No notable events documented.    Patient is sufficiently recovered from the effects of anesthesia to participate in the evaluation and has returned to their pre-procedure level.  Patient location during evaluation: PACU       Patient participation: complete - patient participated  Level of consciousness: awake and alert and responsive to verbal stimuli    Pain management: adequate  Airway patency: patent    Anesthetic complications: no  Cardiovascular status: acceptable  Respiratory status: acceptable  Hydration status: acceptable  Patient post-procedure temperature: Pt Normothermic   PONV Status: Absent

## 2023-01-20 NOTE — Anesthesia Postprocedure Evaluation (Signed)
Anesthesia Post Op Evaluation    Patient: Jeremy Sexton  Procedure(s):  RIGHT GREAT TOE AMPUTATION    Last Vitals:Temperature: 36.4 C (97.6 F) (01/20/23 1929)  Heart Rate: 87 (01/20/23 1940)  BP (Non-Invasive): (!) 163/81 (01/20/23 1940)  Respiratory Rate: (!) 21 (01/20/23 1940)  SpO2: 94 % (01/20/23 1940)    No notable events documented.    Patient is sufficiently recovered from the effects of anesthesia to participate in the evaluation and has returned to their pre-procedure level.  Patient location during evaluation: PACU       Patient participation: complete - patient participated  Level of consciousness: awake and alert and responsive to verbal stimuli    Pain management: adequate  Airway patency: patent    Anesthetic complications: no  Cardiovascular status: acceptable  Respiratory status: acceptable  Hydration status: acceptable  Patient post-procedure temperature: Pt Normothermic   PONV Status: Absent  Comments: Elevated trop on AM labs repeat ordered for this evening, patient asymptomatic.

## 2023-01-20 NOTE — H&P (Signed)
[place] MEDICINE [facility]    HOSPITALIST H&P    ANNE SEBRING 67 y.o. male ED24/ED24   Date of Service: 01/20/2023    Date of Admission:  01/20/2023   PCP: Mliss Sax, DO Code Status:No Order       Chief Complaint:  Increased redness to right foot    HPI:   This is a 67 year old man with known history of diabetes who presented to [facility] Emergency room today with increased redness and foul smelling wound to right foot.  Patient's face that he is longstanding history of diabetes and is unable to feel his foot.  He normally has callus to his feet area.  About 4 days ago as he was taking off his socks he felt something foul-smelling.  Since then he have noticed increased redness right foot.  He presented to emergency room where he was evaluated cellulitis/osteomyelitis for suspected.  Hospitalist service was called and decision was made to admit patient.  General surgery has also been consulted.                        ED medications:   Medications Administered in the ED   Vancomycin IV - Pharmacist to Dose per Protocol (has no administration in time range)   piperacillin-tazobactam (ZOSYN) 3.375 g in NS 100 mL IVPB minibag (3.375 g Intravenous New Bag/New Syringe 01/20/23 0940)   vancomycin (VANCOCIN) 1,750 mg in NS 500 mL IVPB (has no administration in time range)   magnesium sulfate 1 G in D5W 100 mL premix IVPB (has no administration in time range)   potassium chloride 20 mEq in SW 100 mL premix infusion (has no administration in time range)   potassium chloride 20 mEq in SW 100 mL premix infusion (has no administration in time range)   potassium chloride 20 mEq in SW 100 mL premix infusion (has no administration in time range)         PMHx:    Past Medical History:   Diagnosis Date    Diabetes mellitus, type 2 (CMS HCC)     High cholesterol     HTN (hypertension)         PSHx:   Past Surgical History:   Procedure Laterality Date    HX BACK SURGERY      HX TONSILLECTOMY             Allergies:    Allergies   Allergen Reactions    Tetracyclines Rash    Tetracycline     Social History  Social History     Tobacco Use    Smoking status: Never     Passive exposure: Never    Smokeless tobacco: Never   Substance Use Topics    Alcohol use: Never     Comment: Previous, quit 1 year ago    Drug use: Never       Family History  Family Medical History:    None            Home Meds:      Prior to Admission medications    Medication Sig Start Date End Date Taking? Authorizing Provider   acetaminophen (TYLENOL) 500 mg Oral Tablet Take 1 Tablet (500 mg total) by mouth Every 4 hours as needed for Pain   Yes Provider, Historical   atenoloL (TENORMIN) 50 mg Oral Tablet Take 1 Tablet (50 mg total) by mouth Once a day   Yes Provider, Historical  atorvastatin (LIPITOR) 20 mg Oral Tablet Take 1 Tablet (20 mg total) by mouth Every evening   Yes Provider, Historical   cloNIDine HCL (CATAPRES) 0.1 mg Oral Tablet Take 1 Tablet (0.1 mg total) by mouth Twice daily   Yes Provider, Historical   furosemide (LASIX) 20 mg Oral Tablet Take 1 Tablet (20 mg total) by mouth Once a day   Yes Provider, Historical   gabapentin (NEURONTIN) 300 mg Oral Capsule Take 1 Capsule (300 mg total) by mouth Three times a day   Yes Provider, Historical   lisinopriL (PRINIVIL) 10 mg Oral Tablet Take 1 Tablet (10 mg total) by mouth Once a day   Yes Provider, Historical   metFORMIN (GLUCOPHAGE) 500 mg Oral Tablet Take 1 Tablet (500 mg total) by mouth Twice daily with food   Yes Provider, Historical   omeprazole (PRILOSEC) 40 mg Oral Capsule, Delayed Release(E.C.) Take 1 Capsule (40 mg total) by mouth Once a day   Yes Provider, Historical   spironolactone (ALDACTONE) 100 mg Oral Tablet Take 1 Tablet (100 mg total) by mouth Every morning with breakfast   Yes Provider, Historical   traZODone (DESYREL) 50 mg Oral Tablet Take 1 Tablet (50 mg total) by mouth Every night   Yes Provider, Historical   aspirin (ECOTRIN) 81 mg Oral Tablet, Delayed Release  (E.C.) Take 1 Tablet (81 mg total) by mouth Once a day  01/15/23  Provider, Historical   furosemide (LASIX) 20 mg Oral Tablet Take 1 Tablet (20 mg total) by mouth Every Monday, Wednesday and Friday for 15 doses  Patient taking differently: Take 1 Tablet (20 mg total) by mouth Once a day 11/17/22 01/20/23  Roma Kayser, APRN          ROS:   General: No fever or chills. No weight changes, fatigue, weakness.   HEENT: No headaches, dizziness, changes in vision, changes in hearing, or difficulty swallowing.    Skin:  No rashes, erythema or bruises.   Cardiac: No chest pain, palpitations, or arrhythmia.    Respiratory: No shortness of breath, cough, or wheezing.  GI: No nausea or vomiting. No abdominal pain.   Urinary: No dysuria, hematuria, or change in frequency.    Vascular: No edema.     Musculoskeletal:  As per HPI.   Neurologic: No weakness.   Endocrine: No heat or cold intolerance or polydipsia.   Psychiatric: No insomnia, depression or anxiety.      Results for orders placed or performed during the hospital encounter of 01/20/23 (from the past 24 hour(s))   ECG 12 LEAD   Result Value Ref Range    Ventricular rate 66 BPM    Atrial Rate 66 BPM    PR Interval 130 ms    QRS Duration 82 ms    QT Interval 408 ms    QTC Calculation 427 ms    Calculated P Axis 0 degrees    Calculated R Axis 28 degrees    Calculated T Axis 25 degrees   PT/INR   Result Value Ref Range    PROTHROMBIN TIME 16.9 (H) 9.8 - 12.7 seconds    INR 1.44 (H) 0.84 - 1.10   PTT (PARTIAL THROMBOPLASTIN TIME)   Result Value Ref Range    APTT 34.1 25.0 - 38.0 seconds   TROPONIN-I   Result Value Ref Range    TROPONIN I 115 (H) <20 ng/L   COMPREHENSIVE METABOLIC PANEL, NON-FASTING   Result Value Ref Range    SODIUM 140 136 -  145 mmol/L    POTASSIUM 2.7 (LL) 3.5 - 5.1 mmol/L    CHLORIDE 103 98 - 107 mmol/L    CO2 TOTAL 27 21 - 31 mmol/L    ANION GAP 10 4 - 13 mmol/L    BUN 33 (H) 7 - 25 mg/dL    CREATININE 2.95 6.21 - 1.30 mg/dL    BUN/CREA RATIO 36 (H) 6  - 22    ESTIMATED GFR 93 >59 mL/min/1.32m^2    ALBUMIN 3.7 3.5 - 5.7 g/dL    CALCIUM 9.5 8.6 - 30.8 mg/dL    GLUCOSE 657 (H) 74 - 109 mg/dL    ALKALINE PHOSPHATASE 93 34 - 104 U/L    ALT (SGPT) 10 7 - 52 U/L    AST (SGOT) 10 (L) 13 - 39 U/L    BILIRUBIN TOTAL 1.3 (H) 0.3 - 1.2 mg/dL    PROTEIN TOTAL 6.3 (L) 6.4 - 8.9 g/dL    ALBUMIN/GLOBULIN RATIO 1.4 0.8 - 1.4    OSMOLALITY, CALCULATED 289 270 - 290 mOsm/kg    CALCIUM, CORRECTED 9.7 8.9 - 10.8 mg/dL    GLOBULIN 2.6 (L) 2.9 - 5.4   MAGNESIUM   Result Value Ref Range    MAGNESIUM 1.7 (L) 1.9 - 2.7 mg/dL   LACTIC ACID LEVEL W/ REFLEX FOR LEVEL >2.0   Result Value Ref Range    LACTIC ACID 1.3 0.5 - 2.2 mmol/L   THYROID STIMULATING HORMONE WITH FREE T4 REFLEX   Result Value Ref Range    TSH 1.125 0.450 - 5.330 uIU/mL   CBC WITH DIFF   Result Value Ref Range    WBC 11.4 (H) 3.6 - 10.2 x10^3/uL    RBC 3.59 (L) 4.06 - 5.63 x10^6/uL    HGB 11.0 (L) 12.5 - 16.3 g/dL    HCT 84.6 (L) 96.2 - 47.1 %    MCV 90.4 73.0 - 96.2 fL    MCH 30.6 23.8 - 33.4 pg    MCHC 33.9 32.5 - 36.3 g/dL    RDW 95.2 84.1 - 32.4 %    PLATELETS 153 140 - 440 x10^3/uL    MPV 8.5 7.4 - 11.4 fL    NEUTROPHIL % 76 43 - 77 %    LYMPHOCYTE % 13 (L) 16 - 44 %    MONOCYTE % 9 5 - 13 %    EOSINOPHIL % 2 %    BASOPHIL % 0 0 - 1 %    NEUTROPHIL # 8.70 (H) 1.85 - 7.80 x10^3/uL    LYMPHOCYTE # 1.50 1.00 - 3.00 x10^3/uL    MONOCYTE # 1.00 0.30 - 1.00 x10^3/uL    EOSINOPHIL # 0.20 0.00 - 0.50 x10^3/uL    BASOPHIL # 0.00 0.00 - 0.10 x10^3/uL   HGA1C (HEMOGLOBIN A1C WITH EST AVG GLUCOSE)   Result Value Ref Range    HEMOGLOBIN A1C 5.2 4.0 - 6.0 %          Physical:  Filed Vitals:    01/20/23 0807   BP: 128/65   Pulse: 62   Resp: 18   Temp: (!) 35.6 C (96.1 F)   SpO2: 99%      General: Patient is alert and oriented to person, place, and time. No acute distress. Communicates appropriately.   Head: Normocephalic and atraumatic.    Eyes: Pupils equally round and react to light and accommodate. Extraocular movements intact.   Conjunctiva normal. Sclerae are normal.    Nose: Nasal passages clear. Mucosa moist.  Throat: Moist oral mucosa.    Neck: Supple. Trachea midline   Heart: Regular rate and rhythm. S1 & S2 present. No S3 or S4. No rubs, gallops, or murmurs appreciated.  Radial +2 and dorsalis pedis pulses + 1 bilaterally.  Brisk capillary refill.    Lungs: Clear to auscultation bilaterally with no wheezes or rales. Equal chest excursion.  No conversational dyspnea. No respiratory distress noted.   Abdomen: Soft, nontender, nondistended belly. Bowel sounds are present in all four quadrants. No rigidity.  No guarding.  No ascites.   Extremities:  Right foot great toe area redness with open and necrotic looking area on plantar aspect.    Skin: No ecchymosis noted.    Neurologic: Cranial nerves II through XII are grossly intact. Strength 5/5 in upper extremities and lower extremities bilaterally.    Genitourinary:  No urinary incontinence or Foley catheter   Psychiatric: Judgment and insight are intact. Mood and affect are appropriate for the situation.       Diagnostic studies:  No results found.           @PEVF @    Assessments:  Active Hospital Problems   (*Primary Problem)    Diagnosis    *Cellulitis   Cellulitis/osteomyelitis  Clinically patient appears to have osteomyelitis by imaging studies did not suggest that, surgery input is pending.  Patient has been started on vancomycin and Unasyn.  Hypokalemia  Electrolyte replacement has been ordered  Hypomagnesemia  Magnesium is being replaced.  Repeat lab work in a.m.  Diabetes  We will place patient on sliding scale insulin coverage    Condition and plan discussed with patient and all questions were answered.  He verbalized understanding and agreement with plan.    Code status: No Order  DVT prophylaxis: Lovenox  Diet: DIET DIABETIC Calorie amount: CC 1800; Do you want to initiate MNT Protocol? Yes    Disposition:  Estimated length of stay is greater than 48 hr to obtain full  medical treatment.      Louis Meckel, MD    [facility] MEDICINE HOSPITALIST

## 2023-01-20 NOTE — ED Nurses Note (Signed)
To OR vis stretcher 2 IV patent vancomycin infusing in LT lower forearm  Potassium and magnesium infusing in LT [facility]  permit signed  all belongings sent with patient

## 2023-01-20 NOTE — Nurses Notes (Signed)
Patient off floor for surgery to right great toe.

## 2023-01-21 DIAGNOSIS — E1152 Type 2 diabetes mellitus with diabetic peripheral angiopathy with gangrene: Principal | ICD-10-CM

## 2023-01-21 DIAGNOSIS — I10 Essential (primary) hypertension: Secondary | ICD-10-CM

## 2023-01-21 DIAGNOSIS — Z89411 Acquired absence of right great toe: Secondary | ICD-10-CM

## 2023-01-21 DIAGNOSIS — E1169 Type 2 diabetes mellitus with other specified complication: Secondary | ICD-10-CM

## 2023-01-21 DIAGNOSIS — Z09 Encounter for follow-up examination after completed treatment for conditions other than malignant neoplasm: Secondary | ICD-10-CM

## 2023-01-21 DIAGNOSIS — E785 Hyperlipidemia, unspecified: Secondary | ICD-10-CM

## 2023-01-21 LAB — CBC WITH DIFF
BASOPHIL #: 0 10*3/uL (ref 0.00–0.10)
BASOPHIL %: 0 % (ref 0–1)
EOSINOPHIL #: 0.2 10*3/uL (ref 0.00–0.50)
EOSINOPHIL %: 2 %
HCT: 27.7 % — ABNORMAL LOW (ref 36.7–47.1)
HGB: 9.7 g/dL — ABNORMAL LOW (ref 12.5–16.3)
LYMPHOCYTE #: 1.1 10*3/uL (ref 1.00–3.00)
LYMPHOCYTE %: 14 % — ABNORMAL LOW (ref 16–44)
MCH: 31.4 pg (ref 23.8–33.4)
MCHC: 34.9 g/dL (ref 32.5–36.3)
MCV: 90 fL (ref 73.0–96.2)
MONOCYTE #: 0.8 10*3/uL (ref 0.30–1.00)
MONOCYTE %: 10 % (ref 5–13)
MPV: 8 fL (ref 7.4–11.4)
NEUTROPHIL #: 5.6 10*3/uL (ref 1.85–7.80)
NEUTROPHIL %: 73 % (ref 43–77)
PLATELETS: 135 10*3/uL — ABNORMAL LOW (ref 140–440)
RBC: 3.08 10*6/uL — ABNORMAL LOW (ref 4.06–5.63)
RDW: 14.4 % (ref 12.1–16.2)
WBC: 7.7 10*3/uL (ref 3.6–10.2)

## 2023-01-21 LAB — BASIC METABOLIC PANEL
ANION GAP: 10 mmol/L (ref 4–13)
BUN/CREA RATIO: 33 — ABNORMAL HIGH (ref 6–22)
BUN: 19 mg/dL (ref 7–25)
CALCIUM: 8.7 mg/dL (ref 8.6–10.3)
CHLORIDE: 104 mmol/L (ref 98–107)
CO2 TOTAL: 27 mmol/L (ref 21–31)
CREATININE: 0.57 mg/dL — ABNORMAL LOW (ref 0.60–1.30)
ESTIMATED GFR: 108 mL/min/{1.73_m2} (ref >59)
GLUCOSE: 139 mg/dL — ABNORMAL HIGH (ref 74–109)
OSMOLALITY, CALCULATED: 286 mOsm/kg (ref 270–290)
POTASSIUM: 2.7 mmol/L — CL (ref 3.5–5.1)
SODIUM: 141 mmol/L (ref 136–145)

## 2023-01-21 LAB — VANCOMYCIN, TROUGH: VANCOMYCIN TROUGH: 10.4 ug/mL — ABNORMAL HIGH (ref 5.0–10.0)

## 2023-01-21 LAB — MAGNESIUM: MAGNESIUM: 1.8 mg/dL — ABNORMAL LOW (ref 1.9–2.7)

## 2023-01-21 LAB — POC BLOOD GLUCOSE (RESULTS): GLUCOSE, POC: 139 mg/dl — ABNORMAL HIGH (ref 70–100)

## 2023-01-21 MED ORDER — MAGNESIUM SULFATE 1 GRAM/100 ML IN DEXTROSE 5 % INTRAVENOUS PIGGYBACK
1.0000 g | INJECTION | Freq: Once | INTRAVENOUS | Status: AC
Start: 2023-01-21 — End: 2023-01-21
  Administered 2023-01-21: 0 g via INTRAVENOUS
  Administered 2023-01-21: 1 g via INTRAVENOUS
  Filled 2023-01-21: qty 100

## 2023-01-21 MED ORDER — SODIUM CHLORIDE 0.9 % INTRAVENOUS PIGGYBACK
3.0000 g | Freq: Four times a day (QID) | INTRAVENOUS | Status: DC
Start: 2023-01-21 — End: 2023-01-22
  Administered 2023-01-21: 0 g via INTRAVENOUS
  Administered 2023-01-21 – 2023-01-22 (×2): 3 g via INTRAVENOUS
  Administered 2023-01-22 (×2): 0 g via INTRAVENOUS
  Administered 2023-01-22: 3 g via INTRAVENOUS
  Filled 2023-01-21 (×3): qty 8

## 2023-01-21 MED ORDER — MAGNESIUM SULFATE 1 GRAM/100 ML IN DEXTROSE 5 % INTRAVENOUS PIGGYBACK
1.0000 g | INJECTION | INTRAVENOUS | Status: AC
Start: 2023-01-21 — End: 2023-01-21
  Administered 2023-01-21 (×2): 0 g via INTRAVENOUS
  Administered 2023-01-21 (×2): 1 g via INTRAVENOUS
  Filled 2023-01-21 (×2): qty 100

## 2023-01-21 MED ORDER — POTASSIUM CHLORIDE ER 20 MEQ TABLET,EXTENDED RELEASE(PART/CRYST)
40.0000 meq | ORAL_TABLET | ORAL | Status: AC
Start: 2023-01-21 — End: 2023-01-21
  Administered 2023-01-21 (×2): 40 meq via ORAL
  Filled 2023-01-21 (×2): qty 2

## 2023-01-21 MED ORDER — POTASSIUM CHLORIDE ER 20 MEQ TABLET,EXTENDED RELEASE(PART/CRYST)
40.0000 meq | ORAL_TABLET | ORAL | Status: AC
Start: 2023-01-21 — End: 2023-01-21
  Administered 2023-01-21: 40 meq via ORAL
  Filled 2023-01-21: qty 2

## 2023-01-21 MED ORDER — POTASSIUM CHLORIDE 20 MEQ/100ML IN STERILE WATER INTRAVENOUS PIGGYBACK
20.0000 meq | INJECTION | INTRAVENOUS | Status: AC
Start: 2023-01-21 — End: 2023-01-21
  Administered 2023-01-21 (×2): 20 meq via INTRAVENOUS
  Administered 2023-01-21 (×3): 0 meq via INTRAVENOUS
  Administered 2023-01-21: 20 meq via INTRAVENOUS
  Filled 2023-01-21 (×3): qty 100

## 2023-01-21 NOTE — Progress Notes (Signed)
01/21/2023  14:07  Jeremy Sexton  [id number]    History: This is a 67 yr old male with past medical history of diabetes, hypertension, hyperlipidemia, who presented with increased redness and foul-smelling wound to the right foot.  Patient was found to have osteomyelitis and gangrene of the right great toe, taken for transmetatarsal right great toe amputation.    Today:  Seems to be doing well.  Has pretty significant hypokalemia which we have been replacing.  Otherwise no complaints at this time.    Past Medical History  acetaminophen, atenoloL, atorvastatin, cloNIDine HCL, furosemide, gabapentin, lisinopriL, metFORMIN, omeprazole, spironolactone, and traZODone   Allergies   Allergen Reactions    Tetracyclines Rash    Tetracycline      Past Medical History:   Diagnosis Date    Diabetes mellitus, type 2 (CMS HCC)     High cholesterol     HTN (hypertension)          Past Surgical History:   Procedure Laterality Date    HX BACK SURGERY      HX TONSILLECTOMY           Family Medical History:    None         Social History     Socioeconomic History    Marital status: Widowed   Tobacco Use    Smoking status: Never     Passive exposure: Never    Smokeless tobacco: Never   Vaping Use    Vaping status: Never Used   Substance and Sexual Activity    Alcohol use: Never     Comment: Previous, quit 1 year ago    Drug use: Never    Sexual activity: Not Currently     Social Determinants of Health     Social Connections: Low Risk  (01/20/2023)    Social Connections     SDOH Social Isolation: 5 or more times a week       Physical exam:  BP (!) 171/86   Pulse 85   Temp 37.1 C (98.8 F)   Resp 16   Ht 1.829 m (6' 0.01")   Wt 88.5 kg (195 lb)   SpO2 95%   BMI 26.44 kg/m       Gen: This is a 67 y.o. male who is awake alert and oriented x3 in no acute distress  CV:  Regular rate and rhythm without murmurs rubs or gallops.  2+ pedal and radial pulses. No clubbing, cyanosis, or edema.  RESP:  Clear to auscultation bilaterally without  wheezes rales or rhonchi.  Respirations are nonlabored.  GI:  Abdomen is soft, nontender, nondistended.  Bowel sounds normoactive.  GU:  No Foley is present  MSK:  Status post great toe amputation, media for pictures   NEURO:  Cranial nerves 2-12 grossly intact.  No focal deficits as tested.  SKIN: Warm, dry, intact, without lesions, rashes, or ulcerations    Diagnostic Labs:  Results for orders placed or performed during the hospital encounter of 01/20/23 (from the past 24 hour(s))   CBC/DIFF    Narrative    The following orders were created for panel order CBC/DIFF.  Procedure                               Abnormality         Status                     ---------                               -----------         ------  CBC WITH ZDGU[[id number]]                Abnormal            Final result                 Please view results for these tests on the individual orders.   BASIC METABOLIC PANEL   Result Value Ref Range    SODIUM 141 136 - 145 mmol/L    POTASSIUM 2.7 (LL) 3.5 - 5.1 mmol/L    CHLORIDE 104 98 - 107 mmol/L    CO2 TOTAL 27 21 - 31 mmol/L    ANION GAP 10 4 - 13 mmol/L    CALCIUM 8.7 8.6 - 10.3 mg/dL    GLUCOSE 956 (H) 74 - 109 mg/dL    BUN 19 7 - 25 mg/dL    CREATININE 3.87 (L) 0.60 - 1.30 mg/dL    BUN/CREA RATIO 33 (H) 6 - 22    ESTIMATED GFR 108 >59 mL/min/1.18m^2    OSMOLALITY, CALCULATED 286 270 - 290 mOsm/kg    Narrative    Estimated Glomerular Filtration Rate (eGFR) is calculated using the CKD-EPI (2021) equation, intended for patients 100 years of age and older. If gender is not documented or "unknown", there will be no eGFR calculation.     MAGNESIUM   Result Value Ref Range    MAGNESIUM 1.8 (L) 1.9 - 2.7 mg/dL   CBC WITH DIFF   Result Value Ref Range    WBC 7.7 3.6 - 10.2 x10^3/uL    RBC 3.08 (L) 4.06 - 5.63 x10^6/uL    HGB 9.7 (L) 12.5 - 16.3 g/dL    HCT 56.4 (L) 33.2 - 47.1 %    MCV 90.0 73.0 - 96.2 fL    MCH 31.4 23.8 - 33.4 pg    MCHC 34.9 32.5 - 36.3 g/dL    RDW 95.1 88.4 -  16.6 %    PLATELETS 135 (L) 140 - 440 x10^3/uL    MPV 8.0 7.4 - 11.4 fL    NEUTROPHIL % 73 43 - 77 %    LYMPHOCYTE % 14 (L) 16 - 44 %    MONOCYTE % 10 5 - 13 %    EOSINOPHIL % 2 %    BASOPHIL % 0 0 - 1 %    NEUTROPHIL # 5.60 1.85 - 7.80 x10^3/uL    LYMPHOCYTE # 1.10 1.00 - 3.00 x10^3/uL    MONOCYTE # 0.80 0.30 - 1.00 x10^3/uL    EOSINOPHIL # 0.20 0.00 - 0.50 x10^3/uL    BASOPHIL # 0.00 0.00 - 0.10 x10^3/uL   POC BLOOD GLUCOSE (RESULTS)   Result Value Ref Range    GLUCOSE, POC 126 (H) 70 - 100 mg/dl       Diagnostic Imaging:  CT FOOT RIGHT WO IV CONTRAST   Final Result   DIFFUSE SOFT TISSUE SWELLING SUGGESTING CELLULITIS. THERE ARE ALSO SOFT TISSUE ULCERATIVE CHANGES ALONG THE MEDIAL AND LATERAL FIRST TOE WITH SUBCUTANEOUS SOFT TISSUE GAS.      NO CT EVIDENCE OF BONY DESTRUCTION AT THIS TIME.         One or more dose reduction techniques were used (e.g., Automated exposure control, adjustment of the mA and/or kV according to patient size, use of iterative reconstruction technique).         Radiologist location ID: [id number]         XR CHEST AP   Final Result   No acute findings identified  on single view when compared to 11/17/2022.            Radiologist location ID: [id number]         XR FOOT RIGHT   Final Result      No acute right foot fracture or dislocation is demonstrated.      No definite destructive lesion seen to suggest osteomyelitis at this time. MR with contrast is most sensitive and specific for this evaluation.      There is soft tissue swelling and gas along the first metatarsal extending into the great toe. There may be a frank ulcer at the dorsum of the distal great toe.      Anterior circulation atherosclerotic changes, and minimal posterior.      Minimal calcaneal spurs. There is also minimal calcification in the posterior plantar fascial.      Note: CT right foot is pending. Please see report of that study for further assessment.                  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 The time documented excludes procedural time.    Ebbie Ridge, DO

## 2023-01-21 NOTE — Nurses Notes (Signed)
This nurse agrees with all assessments, education, and care-planning performed by Lillian, GN.

## 2023-01-21 NOTE — Progress Notes (Signed)
[facility]  General Surgery      Date of Service:  01/21/2023  Jeremy Sexton, Jeremy Sexton, 67 y.o. male  Date of Admission:  01/20/2023  Date of Birth:  [birth year]  PCP: Mliss Sax, DO    HPI:  Post op right great toe transmetatarsal amputation.  No complaints or pain at the current time.       Filed Vitals:    01/21/23 0900 01/21/23 1053 01/21/23 1100 01/21/23 1700   BP:   (!) 171/86 (!) 157/56   Pulse:  74 85 73   Resp:   16 18   Temp:   37.1 C (98.8 F) 36.3 C (97.4 F)   SpO2: 98%  95% 94%       General: appropriate for age. in no acute distress.    Vital signs are present above and have been reviewed by me     Dressing dry and intact.    Laboratory Data:     CBC  Diff   Lab Results   Component Value Date/Time    WBC 7.7 01/21/2023 04:17 AM    HGB 9.7 (L) 01/21/2023 04:17 AM    HCT 27.7 (L) 01/21/2023 04:17 AM    PLTCNT 135 (L) 01/21/2023 04:17 AM    RBC 3.08 (L) 01/21/2023 04:17 AM    MCV 90.0 01/21/2023 04:17 AM    MCHC 34.9 01/21/2023 04:17 AM    MCH 31.4 01/21/2023 04:17 AM    RDW 14.4 01/21/2023 04:17 AM    MPV 8.0 01/21/2023 04:17 AM    Lab Results   Component Value Date/Time    PMNS 73 01/21/2023 04:17 AM    LYMPHOCYTES 14 (L) 01/21/2023 04:17 AM    EOSINOPHIL 2 01/21/2023 04:17 AM    MONOCYTES 10 01/21/2023 04:17 AM    BASOPHILS 0 01/21/2023 04:17 AM    BASOPHILS 0.00 01/21/2023 04:17 AM    PMNABS 5.60 01/21/2023 04:17 AM    LYMPHSABS 1.10 01/21/2023 04:17 AM    EOSABS 0.20 01/21/2023 04:17 AM    MONOSABS 0.80 01/21/2023 04:17 AM             Basic Metabolic Profile    Lab Results   Component Value Date/Time    SODIUM 141 01/21/2023 04:17 AM    POTASSIUM 2.7 (LL) 01/21/2023 04:17 AM    CHLORIDE 104 01/21/2023 04:17 AM    CO2 27 01/21/2023 04:17 AM    ANIONGAP 10 01/21/2023 04:17 AM    Lab Results   Component Value Date/Time    BUN 19 01/21/2023 04:17 AM    CREATININE 0.57 (L) 01/21/2023 04:17 AM    GLUCOSENF 139 (H) 01/21/2023 04:17 AM               Assessment/Plan:  Active Hospital Problems     Diagnosis    Primary Problem: Cellulitis    Cellulitis of right foot    Type 2 diabetes mellitus (CMS HCC)        Cellulitis     Start dressing changes tomorrow.  At the time of his evaluation been less than 12 hours since his surgery so unlikely that would be any significant change in his short of time to change dressing at this time.  Updated patient on operative findings.    Maura Crandall MD MBA CPE FACS        This note was partially created using voice recognition software and is inherently subject to errors including those of syntax and "sound alike " substitutions  which may escape proof reading. In such instances, original meaning may be extrapolated by contextual derivation.

## 2023-01-21 NOTE — Care Plan (Signed)
Pt admitted with cellulitis. Pt is on room air. Pt is on bedrest. Fall prevention protocol maintained. Bed alarm set with call light in reach. Education is ongoing. Discharge planning is in place.   Problem: Adult Inpatient Plan of Care  Goal: Patient-Specific Goal (Individualized)  Outcome: Ongoing (see interventions/notes)  Flowsheets (Taken 01/20/2023 2115)  Patient would like to participate in bedside shift report: Yes  Individualized Care Needs: Monitor labs and vitals, assist with ADLs, administer ABX  Anxieties, Fears or Concerns: Pt concerned about pain after surgery  Patient-Specific Goals (Include Timeframe): D/C home when medically stable  Plan of Care Reviewed With: patient     Problem: Adult Inpatient Plan of Care  Goal: Absence of Hospital-Acquired Illness or Injury  Outcome: Ongoing (see interventions/notes)  Intervention: Identify and Manage Fall Risk  Recent Flowsheet Documentation  Taken 01/20/2023 2115 by Emilio Aspen, NURSE EXTERN  Safety Promotion/Fall Prevention:   activity supervised   fall prevention program maintained   nonskid shoes/slippers when out of bed   safety round/check completed  Intervention: Prevent Skin Injury  Recent Flowsheet Documentation  Taken 01/20/2023 2115 by Emilio Aspen, NURSE EXTERN  Body Position: supine, head elevated  Skin Protection:   adhesive use limited   tubing/devices free from skin contact     Problem: Fall Injury Risk  Goal: Absence of Fall and Fall-Related Injury  Outcome: Ongoing (see interventions/notes)  Intervention: Identify and Manage Contributors  Recent Flowsheet Documentation  Taken 01/20/2023 2115 by Emilio Aspen, NURSE EXTERN  Self-Care Promotion: independence encouraged  Intervention: Promote Injury-Free Environment  Recent Flowsheet Documentation  Taken 01/20/2023 2115 by Emilio Aspen, NURSE EXTERN  Safety Promotion/Fall Prevention:   activity supervised   fall prevention program maintained   nonskid shoes/slippers when out  of bed   safety round/check completed     Problem: Infection  Goal: Absence of Infection Signs and Symptoms  Outcome: Ongoing (see interventions/notes)  Intervention: Prevent or Manage Infection  Recent Flowsheet Documentation  Taken 01/20/2023 2115 by Emilio Aspen, NURSE EXTERN  Fever Reduction/Comfort Measures: room temperature adjusted     Problem: Skin Injury Risk Increased  Goal: Skin Health and Integrity  Outcome: Ongoing (see interventions/notes)  Intervention: Optimize Skin Protection  Recent Flowsheet Documentation  Taken 01/20/2023 2115 by Emilio Aspen, NURSE EXTERN  Pressure Reduction Techniques:   Heels elevated off of the bed   Frequent weight shifting encouraged  Pressure Reduction Devices: Repositioning wedges/pillows utilized  Skin Protection:   adhesive use limited   tubing/devices free from skin contact  Head of Bed (HOB) Positioning: 30 degrees

## 2023-01-22 DIAGNOSIS — Z881 Allergy status to other antibiotic agents status: Secondary | ICD-10-CM

## 2023-01-22 DIAGNOSIS — Z89421 Acquired absence of other right toe(s): Secondary | ICD-10-CM

## 2023-01-22 DIAGNOSIS — E11628 Type 2 diabetes mellitus with other skin complications: Secondary | ICD-10-CM

## 2023-01-22 DIAGNOSIS — M7989 Other specified soft tissue disorders: Secondary | ICD-10-CM

## 2023-01-22 LAB — CBC WITH DIFF
BASOPHIL #: 0 10*3/uL (ref 0.00–0.10)
BASOPHIL %: 0 % (ref 0–1)
EOSINOPHIL #: 0.2 10*3/uL (ref 0.00–0.50)
EOSINOPHIL %: 4 %
HCT: 27.4 % — ABNORMAL LOW (ref 36.7–47.1)
HGB: 9.3 g/dL — ABNORMAL LOW (ref 12.5–16.3)
LYMPHOCYTE #: 1.8 10*3/uL (ref 1.00–3.00)
LYMPHOCYTE %: 27 % (ref 16–44)
MCH: 30.7 pg (ref 23.8–33.4)
MCHC: 34 g/dL (ref 32.5–36.3)
MCV: 90.3 fL (ref 73.0–96.2)
MONOCYTE #: 0.9 10*3/uL (ref 0.30–1.00)
MONOCYTE %: 14 % — ABNORMAL HIGH (ref 5–13)
MPV: 7.6 fL (ref 7.4–11.4)
NEUTROPHIL #: 3.5 10*3/uL (ref 1.85–7.80)
NEUTROPHIL %: 55 % (ref 43–77)
PLATELETS: 160 10*3/uL (ref 140–440)
RBC: 3.04 10*6/uL — ABNORMAL LOW (ref 4.06–5.63)
RDW: 14.5 % (ref 12.1–16.2)
WBC: 6.4 10*3/uL (ref 3.6–10.2)

## 2023-01-22 LAB — POC BLOOD GLUCOSE (RESULTS)
GLUCOSE, POC: 134 mg/dl — ABNORMAL HIGH (ref 70–100)
GLUCOSE, POC: 188 mg/dl — ABNORMAL HIGH (ref 70–100)

## 2023-01-22 LAB — COMPREHENSIVE METABOLIC PANEL, NON-FASTING
ALBUMIN/GLOBULIN RATIO: 1.3 (ref 0.8–1.4)
ALBUMIN: 2.8 g/dL — ABNORMAL LOW (ref 3.5–5.7)
ALKALINE PHOSPHATASE: 72 U/L (ref 34–104)
ALT (SGPT): 8 U/L (ref 7–52)
ANION GAP: 7 mmol/L (ref 4–13)
AST (SGOT): 9 U/L — ABNORMAL LOW (ref 13–39)
BILIRUBIN TOTAL: 0.7 mg/dL (ref 0.3–1.2)
BUN/CREA RATIO: 20 (ref 6–22)
BUN: 10 mg/dL (ref 7–25)
CALCIUM, CORRECTED: 9.6 mg/dL (ref 8.9–10.8)
CALCIUM: 8.6 mg/dL (ref 8.6–10.3)
CHLORIDE: 103 mmol/L (ref 98–107)
CO2 TOTAL: 29 mmol/L (ref 21–31)
CREATININE: 0.51 mg/dL — ABNORMAL LOW (ref 0.60–1.30)
ESTIMATED GFR: 112 mL/min/{1.73_m2} (ref >59)
GLOBULIN: 2.2 — ABNORMAL LOW (ref 2.9–5.4)
GLUCOSE: 117 mg/dL — ABNORMAL HIGH (ref 74–109)
OSMOLALITY, CALCULATED: 278 mOsm/kg (ref 270–290)
POTASSIUM: 3 mmol/L — ABNORMAL LOW (ref 3.5–5.1)
PROTEIN TOTAL: 5 g/dL — ABNORMAL LOW (ref 6.4–8.9)
SODIUM: 139 mmol/L (ref 136–145)

## 2023-01-22 LAB — MAGNESIUM: MAGNESIUM: 1.9 mg/dL (ref 1.9–2.7)

## 2023-01-22 MED ORDER — AMOXICILLIN 875 MG-POTASSIUM CLAVULANATE 125 MG TABLET
1.0000 | ORAL_TABLET | Freq: Two times a day (BID) | ORAL | 0 refills | Status: DC
Start: 2023-01-22 — End: 2023-02-24

## 2023-01-22 MED ORDER — AMOXICILLIN 875 MG-POTASSIUM CLAVULANATE 125 MG TABLET
1.0000 | ORAL_TABLET | Freq: Two times a day (BID) | ORAL | Status: DC
Start: 2023-01-22 — End: 2023-01-22
  Administered 2023-01-22: 1 via ORAL
  Filled 2023-01-22: qty 1

## 2023-01-22 MED ORDER — POTASSIUM CHLORIDE ER 20 MEQ TABLET,EXTENDED RELEASE(PART/CRYST)
20.0000 meq | ORAL_TABLET | Freq: Two times a day (BID) | ORAL | 0 refills | Status: DC
Start: 2023-01-22 — End: 2023-02-24

## 2023-01-22 NOTE — Consults (Signed)
[facility] [email or web address] Initial Note    Reason for Consult:  Cellulitis/Abs duration S/P amputation    Pertinent Clinical History:   23M admitted 6/25 with limb threatening Rt diabetic foot infection and S/P 1st Toe transmetatarsal amputation 6/25.  CT negative for bone involvement.    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  Communicated with provider through Secure Chat/In [employer].  Total time >30 min.     Paschal Blanton R. Madolyn Frieze, MD  Professor, Infectious Diseases  Department of Medicine, [facility], [place]

## 2023-01-22 NOTE — Nurses Notes (Signed)
Bandage removed per Dr. Sabino Gasser instructions. Dr. Sabino Gasser at bedside to assess the patient's surgical/amputation site. Verbal orders received for wet to dry daily. Dressing replaced by this nurse at this time. See LDAW. Patient medicated with pain medication prior to dressing change. Patient tolerated dressing change well.

## 2023-01-22 NOTE — Nurses Notes (Signed)
Patient discharged home with family.  AVS reviewed with patient/care giver.  A written copy of the AVS and discharge instructions was given to the patient/care giver. Questions sufficiently answered as needed.  Patient/care giver encouraged to follow up with PCP and general surgeon as scheduled. Wound care discussed again with patient, he voiced understanding and voiced back how to care for his wound. IV's removed from left arm. Tele removed. All patient belongings in his possession, including new crutches and boot for right foot.

## 2023-01-22 NOTE — PT Evaluation (Signed)
[facility]  [address]  [place], [address]  [phone]  (Fax) [phone]  Rehabilitation Services  Physical Therapy Inpatient Initial Evaluation    Patient Name: Jeremy Sexton  Date of Birth: 12/24/1955  Height: Height: 182.9 cm (6' 0.01")  Weight: Weight: 93.7 kg (206 lb 9 oz)  Room/Bed: 313/A  Payor: RAILROAD MEDICARE / Plan: RAILROAD MEDICARE / Product Type: Medicare /       PMH:  Past Medical History:   Diagnosis Date    Diabetes mellitus, type 2 (CMS HCC)     High cholesterol     HTN (hypertension)            Assessment:      (P) Pt is a 67 yo male post op  R great toe transmetatarsal amputation secondary to cellulitis. PT Assessment:Crutches and post op boot obtained by this PT. Pt fitted with crutches and a R post op boot. Pt instructed in safe use of crutches inititally. Pt demonstrated safe use of crutches x 160' with Mod I. Nurse informed.    Discharge Needs:    Equipment Recommendation: (P) none anticipated    PT Eval Oonly. See Assessment section for details.      Discharge Disposition: (P) home          Plan:   Current Intervention:    (P) evaluation only.    The risks/benefits of therapy have been discussed with the patient/caregiver and he/she is in agreement with the established plan of care.       Subjective & Objective     Past Medical History:   Diagnosis Date    Diabetes mellitus, type 2 (CMS HCC)     High cholesterol     HTN (hypertension)             Past Surgical History:   Procedure Laterality Date    HX BACK SURGERY      HX TONSILLECTOMY                   01/22/23 1600   Rehab Session   Document Type evaluation   PT Visit Date 01/22/23   Patient Effort excellent   Symptoms Noted During/After Treatment none   General Information   Patient Profile Reviewed yes   Medical Lines PIV Line;Telemetry   Respiratory Status room air   Mutuality/Individual Preferences   Patient-Specific Goals (Include Timeframe) To return to home.   Plan of Care  Reviewed With patient   Living Environment   Lives With sibling(s)   Living Arrangements house   Home Accessibility no concerns;bed and bath on same level;grab bars present (bathtub);stairs to enter home   Living Environment Comment Pt lives on the main floor of a 2 story home with walk in shower and shower chair. Pt reports having 1 STE.   Functional Level Prior   Ambulation 0 - independent   Transferring 0 - independent   Toileting 0 - independent   Bathing 0 - independent   Dressing 0 - independent   Eating 0 - independent   Communication 0 - understands/communicates without difficulty   Swallowing 0-->swallows foods/liquids without difficulty   Self-Care   Dominant Hand left   Equipment Currently Used at Home yes   Equipment Currently Used at [employer]  chair   Equipment Brought to Hospital none   Pre Treatment Status   Pre Treatment Patient Status Patient supine in bed;Call light within reach;Telephone within reach;Nurse approved session   Support  Present Pre Treatment  None   Communication Pre Treatment  Charge Nurse   Communication Pre Treatment Comment Cleared for PT.   Cognitive Assessment/Interventions   Behavior/Mood Observations behavior appropriate to situation, WNL/WFL   Orientation Status oriented x 4   Attention WNL/WFL   Follows Commands WFL   Vital Signs   Vitals Comment Pulse Ox not attached. Pt preparing to rturn to home.   RLE Assessment   RLE Assessment WFL- Within Functional Limits   LLE Assessment   LLE Assessment WFL- Within Functional Limits   Bed Mobility Assessment/Treatment   Supine-Sit Independence independent   Sit to Supine, Independence independent   Transfer Assessment/Treatment   Sit-Stand Independence independent   Stand-Sit Independence independent   Gait Assessment/Treatment   Total Distance Ambulated 160   Independence  modified independence   Assistive Device  axillary crutches   Distance in Feet 160   Post Treatment Status   Post Treatment Patient Status Patient sitting in  bedside chair or w/c;Call light within reach   Support Present Post Treatment  None   Communication Post Treatement Nurse   Communication Post Treatment Comment Inc=formed Nurse of pt's safe use of  crutches.   Physical Therapy Clinical Impression   Assessment Pt is a 67 yo male post op  R great toe transmetatarsal amputation secondary to cellulitis. PT Assessment:Crutches and post op boot obtained by this PT. Pt fitted with crutches and a R post op boot. Pt instructed in safe use of crutches inititally. Pt demonstrated safe use of crutches x 160' with Mod I. Nurse informed.   Patient/Family Goals Statement To return to home.   Criteria for Skilled Therapeutic no problems identified which require skilled intervention   Predicted Duration of Therapy Intervention (days/wks) evaluation only   Anticipated Equipment Needs at Discharge (PT) none anticipated   Anticipated Discharge Disposition home     (INSERT FLOWSHEET)            INTERVENTION MINUTES: EVALUATION 30 minutes and GAIT TRAINING 8 MINUTES    EVALUATION COMPLEXITY : CLINICAL DECISION MAKING OF LOW COMPLEXITY AS INDICATED BY PMH, PHYSICAL THERAPY ASSESSMENT OF MUSCULOSKELETAL AND NEUROLOGICAL SYSTEMS AND ACTIVITY LIMITATIONS. CLINICAL PRESENTATION IS STABLE AND UNCOMPLICATED    Therapist:     Kerry Fort, PT  01/22/2023, 17:05

## 2023-01-22 NOTE — Discharge Summary (Signed)
[facility]  DISCHARGE SUMMARY    PATIENT NAME:  Jeremy Sexton, Jeremy Sexton  MRN:  [id number]  DOB:  23-Oct-1955    ENCOUNTER DATE:  01/20/2023  INPATIENT ADMISSION DATE: 01/20/2023  DISCHARGE DATE:  01/22/2023    ATTENDING PHYSICIAN: Delight Ovens, MD  SERVICE: PRN HOSPITALIST 3  PRIMARY CARE PHYSICIAN: Hiep Le, DO       No lay caregiver identified.    PRIMARY DISCHARGE DIAGNOSIS: Cellulitis  Active Hospital Problems    Diagnosis Date Noted    Principal Problem: Cellulitis [L03.90] 01/20/2023    Necrotizing soft tissue infection [M79.89] 01/22/2023    Cellulitis of right foot [L03.115] 01/20/2023    Type 2 diabetes mellitus (CMS HCC) [E11.9] 01/20/2023      Resolved Hospital Problems   No resolved problems to display.     Active Non-Hospital Problems    Diagnosis Date Noted    Diabetes mellitus (CMS HCC) 01/15/2023    Hyperlipidemia 01/15/2023    Hypertensive disorder 01/15/2023    Low back pain 01/15/2023           Current Discharge Medication List        START taking these medications.        Details   amoxicillin-pot clavulanate 875-125 mg Tablet  Commonly known as: AUGMENTIN   1 Tablet, Oral, 2 TIMES DAILY  Qty: 10 Tablet  Refills: 0     potassium chloride 20 mEq Tab Sust.Rel. Particle/Crystal  Commonly known as: K-DUR   20 mEq, Oral, 2 TIMES DAILY  Qty: 6 Tablet  Refills: 0            CONTINUE these medications - NO CHANGES were made during your visit.        Details   acetaminophen 500 mg Tablet  Commonly known as: TYLENOL   500 mg, Oral, EVERY 4 HOURS PRN  Refills: 0     atenoloL 50 mg Tablet  Commonly known as: TENORMIN   50 mg, Oral, DAILY  Refills: 0     atorvastatin 20 mg Tablet  Commonly known as: LIPITOR   20 mg, Oral, EVERY EVENING  Refills: 0     cloNIDine HCL 0.1 mg Tablet  Commonly known as: CATAPRES   0.1 mg, Oral, 2 TIMES DAILY  Refills: 0     furosemide 20 mg Tablet  Commonly known as: LASIX   20 mg, Oral, DAILY  Refills: 0     gabapentin 300 mg Capsule  Commonly known as: NEURONTIN   300 mg,  Oral, 3 TIMES DAILY  Refills: 0     lisinopriL 10 mg Tablet  Commonly known as: PRINIVIL   10 mg, Oral, DAILY  Refills: 0     metFORMIN 500 mg Tablet  Commonly known as: GLUCOPHAGE   500 mg, Oral, 2 TIMES DAILY WITH FOOD  Refills: 0     omeprazole 40 mg Capsule, Delayed Release(E.C.)  Commonly known as: PRILOSEC   40 mg, Oral, DAILY  Refills: 0     spironolactone 100 mg Tablet  Commonly known as: ALDACTONE   100 mg, Oral, EVERY MORNING WITH BREAKFAST  Refills: 0     traZODone 50 mg Tablet  Commonly known as: DESYREL   50 mg, Oral, NIGHTLY  Refills: 0            Discharge med list refreshed?  YES     Allergies   Allergen Reactions    Tetracyclines Rash    Tetracycline      HOSPITAL PROCEDURE(S):  No orders of the defined types were placed in this encounter.    Surgical/Procedural Cases on this Admission       Case IDs Date Procedure Surgeon Location Status    [id number] 01/20/23 RIGHT GREAT TOE AMPUTATION Esmond Plants, MD [facility]          REASON FOR HOSPITALIZATION AND HOSPITAL COURSE   BRIEF HPI:  This is a 67 y.o., male admitted for Gangrenous great toe of the right foot with osteomyelitis     BRIEF HOSPITAL NARRATIVE:       This is a 67 yr old male with past medical history of diabetes, hypertension, hyperlipidemia, who presented with increased redness and foul-smelling wound to the right foot.  Patient was found to have osteomyelitis and gangrene of the right great toe.  Was evaluated by General surgery.  S/p transmetatarsal right great toe amputation.     The patient's disease was consulted.  Recommended Augmentin on discharge x 5 days pos op.    He will follow up outpatient with his PCP and with General surgery.        TRANSITION/POST DISCHARGE CARE/PENDING TESTS/REFERRALS:  Surgical pathology    CONDITION ON DISCHARGE:  A. Ambulation: Full ambulation  B. Self-care Ability: Complete  C. Cognitive Status Alert and Oriented x 3  D. Code status at discharge:       LINES/DRAINS/WOUNDS AT DISCHARGE:    Patient Lines/Drains/Airways Status       Active Line / Dialysis Catheter / Dialysis Graft / Drain / Airway / Wound       Name Placement date Placement time Site Days    Peripheral IV Left Cephalic  (lateral side of arm) 01/20/23  0935  -- 2    Peripheral IV Left Median Cubital  (antecubital fossa) 01/20/23  1111  -- 2    Wound  Right Foot 01/20/23  1924  -- 1                    DISCHARGE DISPOSITION:  Home discharge  DISCHARGE INSTRUCTIONS:  Post-Discharge Follow Up Appointments       Follow up with Conley Rolls, Hiep, DO in 1 week(s)    Phone: [phone]    Where: [address] [place], [place] [address]    Follow up with Esmond Plants, MD in 1 week(s)    Phone: [phone]    Where: [facility]      Friday Feb 13, 2023    Return Patient Visit with Cydney Ok, NP at 11:00 AM      Ne[facility], [facility]  [facility] [place]  [address]  [place] [address]  [phone]          No discharge procedures on file.       Delight Ovens, MD    Copies sent to Care Team         Relationship Specialty Notifications Start End    Mliss Sax, DO PCP - General FAMILY MEDICINE  09/03/22     Phone: [phone] Fax: [phone]         [address] [place] [address]            Referring providers can utilize [email or web address] to access their referred [facility] patient's information.

## 2023-01-22 NOTE — OT Evaluation (Signed)
[facility]  [address]  [place], [address]  [phone]  (Fax) [phone]  Rehabilitation Services  Occupational Therapy Inpatient Initial Evaluation      Patient Name: Jeremy Sexton  Date of Birth: 12-25-1955  Height: Height: 182.9 cm (6' 0.01")  Weight: Weight: 93.7 kg (206 lb 9 oz)  Room/Bed: 313/A  Payor: RAILROAD MEDICARE / Plan: RAILROAD MEDICARE / Product Type: Medicare /         PMH:   Past Medical History:   Diagnosis Date    Diabetes mellitus, type 2 (CMS HCC)     High cholesterol     HTN (hypertension)            Assessment:      Functional Level at Time of Session: (P) Patient is a pleasant 67 year old male admitted for R toe cellultitis. He underwent a right great toe transmetatarsal amputation on 06/25. Prior to admission, he was independent in ADLs, IADLs and functional mobility. During OT evaluation, patient was alert, oriented x4, cooperative and able to follow multistep commands. He has full LUE AROM, RUE AAROM and fair plus (3+/5) UB strength. He was able to engage in transfers and dressing with CGA-min A. OT to follow up during acute stay for ADL training and functional transfer training.    Discharge Needs:   Equipment Recommendation:  none anticipated    The patient presents with mobility limitations due to impaired range of motion that significantly impair/prevent patient's ability to participate in mobility-related activities of daily living (MRADLs) including  ambulation and transfers in order to safely complete, toileting, bathing, food preparation, laundering/household tasks, safely entering/exiting the home, in reasonable time. This functional mobility deficit can be sufficiently resolved with the use of a Anticipated Equipment Needs at Discharge: (P) none anticipated  in order to decrease the risk of falls, morbidity, and mortality in performance of these MRADLs.  Patient is able to safely use this assistive device.    Discharge  Disposition:  home with assist    JUSTIFICATION OF DISCHARGE RECOMMENDATION   Based on current diagnosis, functional performance prior to admission, and current functional performance, this patient requires continued OT services in Anticipated Discharge Disposition: (P) home with assist  in order to achieve significant functional improvements.    Plan:   Current Intervention:  Predicted Duration of Therapy: (P) until discharge    To provide Occupational therapy services  Therapy Frequency: (P) minimum of 1x/week for duration of Predicted Duration of Therapy: (P) until discharge  .       The risks/benefits of therapy have been discussed with the patient/caregiver and he/she is in agreement with the established plan of care.       Subjective & Objective     MEDICAL HISTORY:   Past Medical History:   Diagnosis Date    Diabetes mellitus, type 2 (CMS HCC)     High cholesterol     HTN (hypertension)          SURGICAL HISTORY:   Past Surgical History:   Procedure Laterality Date    HX BACK SURGERY      HX TONSILLECTOMY         ipp    INSERT FLOW SHEET     01/22/23 1153   Rehab Session   Document Type evaluation   OT Visit Date 01/22/23   Total OT Minutes: 15   Patient Effort excellent   General Information   Patient Profile Reviewed yes  Medical Lines PIV Line;Telemetry   Respiratory Status room air   Pre Treatment Status   Pre Treatment Patient Status Patient supine in bed;Call light within reach;Telephone within reach;Patient safety alarm activated;Nurse approved session   Support Present Pre Treatment  None   Communication Pre Treatment  Charge Nurse   Communication Pre Treatment Comment Cleared for OT   Living Environment   Lives With sibling(s)   Living Arrangements house   Home Accessibility stairs to enter home;stairs within home   Living Environment Comment He has a walk-in shower and shower chair.   Home Main Entrance   Number of Stairs, Main Entrance one   Stairs Within Home, Primary   Number of Stairs, Within  Home, Primary other (see comments)  (13)   Functional Level Prior   Ambulation 0 - independent   Transferring 0 - independent   Toileting 0 - independent   Bathing 0 - independent   Dressing 0 - independent   Eating 0 - independent   Communication 0 - understands/communicates without difficulty   Swallowing 0-->swallows foods/liquids without difficulty   Self-Care   Dominant Hand left   Usual Activity Tolerance good   Current Activity Tolerance good   Vital Signs   Pre-Treatment Heart Rate (beats/min) 69   Post-treatment Heart Rate (beats/min) 74   Pre SpO2 (%) 93   O2 Delivery Pre Treatment room air   Post SpO2 (%) 97   O2 Delivery Post Treatment room air   Coping/Psychosocial   Observed Emotional State calm;cooperative   Verbalized Emotional State acceptance   Family/Support System   Family/Support Persons sibling   Involvement in Care not present at bedside   Coping/Psychosocial Response Interventions   Plan Of Care Reviewed With patient   Cognitive Assessment/Interventions   Behavior/Mood Observations behavior appropriate to situation, WNL/WFL   Orientation Status oriented x 4   Attention WNL/WFL   Follows Commands WFL   Vision Assessment/Interventions   Visual Impairment/Limitations WFL   RUE Assessment   RUE Assessment WFL- Within Functional Limits   LUE Assessment   LUE Assessment X-Exceptions   LUE ROM WFL AAROM  (Rotator cuff injury)   LUE Strength 3/5   Grip Strength   Grip Left (3+/5) fair plus, left   Right Grip (3+/5) fair plus, right   Bed Mobility Assessment/Treatment   Supine-Sit Independence contact guard assist  (pulling therapist's hand)   Impairments ROM decreased   Transfer Assessment/Treatment   Sit-Stand Independence minimum assist (75% patient effort)   Stand-Sit Independence minimum assist (75% patient effort)   Sit-Stand-Sit, Assist Device handheld assist   Lower Body Dressing Assessment/Training   Position sitting   DRESSING ASSESSED Don Socks;Doff Socks   ASSISTANCE REQUIRED DONNING Left  sock   Independence Level  standby assist   Post Treatment Status   Post Treatment Patient Status Patient sitting on edge of bed;Call light within reach;Telephone within reach;Patient safety alarm activated   Support Present Post Treatment  None   Care Plan Goals   OT Rehab Goals Bathing Goal;LB Dressing Goal;Toileting Goal   Bathing Goal   Bathing Goal, Date Established 01/22/23   Bathing Goal, Time to Achieve by discharge   Bathing Goal, Activity Type all bathing tasks   Bathing Goal, Independence Level independent   LB Dressing Goal   LB Dressing Goal, Date Established 01/22/23   LB Dressing Goal, Time to Achieve by discharge   LB Dressing Goal, Activity Type all lower body dressing tasks   LB Dressing Goal, Independence Level  independent   Toileting Goal   Toileting Goal, Date Established 01/22/23   Toileting Goal, Time to Achieve by discharge   Toileting Goal, Activity Type all toileting tasks   Toileting Goal, Independence Level independent   Planned Therapy Interventions, OT Eval   Planned Therapy Interventions ADL retraining   Clinical Impression   Functional Level at Time of Session Patient is a pleasant 67 year old male admitted for R toe cellultitis. He underwent a right great toe transmetatarsal amputation on 06/25. Prior to admission, he was independent in ADLs, IADLs and functional mobility. During OT evaluation, patient was alert, oriented x4, cooperative and able to follow multistep commands. He has full LUE AROM, RUE AAROM and fair plus (3+/5) UB strength. He was able to engage in transfers and dressing with CGA-min A. OT to follow up during acute stay for ADL training and functional transfer training.   Criteria for Skilled Therapeutic Interventions Met (OT) yes;meets criteria;skilled treatment is necessary   Rehab Potential good   Therapy Frequency minimum of 1x/week   Predicted Duration of Therapy until discharge   Anticipated Equipment Needs at Discharge none anticipated   Anticipated Discharge  Disposition home with assist   Evaluation Complexity Justification   Occupational Profile Review Brief history   Performance Deficits 1-3 deficits   Clinical Decision Making Low analytic complexity   Evaluation Complexity Low       TREATMENT PLAN: THERAPEUTIC ACTIVITIES and ADL/IADL TRAINING  EVALUATION COMPLEXITY: CLINICAL DECISION MAKING OF LOW COMPLEXITY AS INDICATED BY PMH, OCCUPATIONAL THERAPY ASSESSMENT OF MUSCULOSKELETAL AND NEUROLOGICAL SYSTEMS AND ACTIVITY LIMITATIONS. CLINICAL PRESENTATION IS STABLE AND UNCOMPLICATED.      EVALUATION 15 minutes    Therapist:      Hildred Laser, OT,01/22/2023 12:18

## 2023-01-22 NOTE — Progress Notes (Signed)
[facility]  General Surgery      Date of Service:  01/22/2023  Jeremy Sexton, [name], 67 y.o. male  Date of Admission:  01/20/2023  Date of Birth:  December 27, 1955  PCP: Jeremy Sax, DO    HPI:  Post op right great toe transmetatarsal amputation.  No complaints at the current time.  Anxious to go home       Filed Vitals:    01/21/23 2226 01/21/23 2317 01/22/23 0255 01/22/23 0717   BP:  (!) 161/70 (!) 145/69 (!) 170/71   Pulse: 80 87 72 82   Resp:  18 18 18    Temp:  (!) 38.1 C (100.5 F) 37.1 C (98.8 F) 36.9 C (98.4 F)   SpO2:  92% 99%        General: appropriate for age. in no acute distress.    Vital signs are present above and have been reviewed by me     Wound site looks good.  Minimal (1-2 mm) of devitalized skin at the periphery distally.  Otherwise no other unfavorable appearance of the wound.  No expressible drainage.  No erythema    Laboratory Data:     CBC  Diff   Lab Results   Component Value Date/Time    WBC 6.4 01/22/2023 05:04 AM    HGB 9.3 (L) 01/22/2023 05:04 AM    HCT 27.4 (L) 01/22/2023 05:04 AM    PLTCNT 160 01/22/2023 05:04 AM    RBC 3.04 (L) 01/22/2023 05:04 AM    MCV 90.3 01/22/2023 05:04 AM    MCHC 34.0 01/22/2023 05:04 AM    MCH 30.7 01/22/2023 05:04 AM    RDW 14.5 01/22/2023 05:04 AM    MPV 7.6 01/22/2023 05:04 AM    Lab Results   Component Value Date/Time    PMNS 55 01/22/2023 05:04 AM    LYMPHOCYTES 27 01/22/2023 05:04 AM    EOSINOPHIL 4 01/22/2023 05:04 AM    MONOCYTES 14 (H) 01/22/2023 05:04 AM    BASOPHILS 0 01/22/2023 05:04 AM    BASOPHILS 0.00 01/22/2023 05:04 AM    PMNABS 3.50 01/22/2023 05:04 AM    LYMPHSABS 1.80 01/22/2023 05:04 AM    EOSABS 0.20 01/22/2023 05:04 AM    MONOSABS 0.90 01/22/2023 05:04 AM             Basic Metabolic Profile    Lab Results   Component Value Date/Time    SODIUM 139 01/22/2023 05:04 AM    POTASSIUM 3.0 (L) 01/22/2023 05:04 AM    CHLORIDE 103 01/22/2023 05:04 AM    CO2 29 01/22/2023 05:04 AM    ANIONGAP 7 01/22/2023 05:04 AM    Lab Results    Component Value Date/Time    BUN 10 01/22/2023 05:04 AM    CREATININE 0.51 (L) 01/22/2023 05:04 AM    GLUCOSENF 117 (H) 01/22/2023 05:04 AM               Assessment/Plan:  Active Hospital Problems    Diagnosis    Primary Problem: Cellulitis    Cellulitis of right foot    Type 2 diabetes mellitus (CMS HCC)        Cellulitis     From surgical standpoint can be discharged whenever antibiotic decisions and arrangements were made.  Start dressing changes.    Maura Crandall MD MBA CPE FACS        This note was partially created using voice recognition software and is inherently subject to errors including those  of syntax and "sound alike " substitutions which may escape proof reading. In such instances, original meaning may be extrapolated by contextual derivation.

## 2023-01-23 ENCOUNTER — Encounter (INDEPENDENT_AMBULATORY_CARE_PROVIDER_SITE_OTHER): Payer: Self-pay

## 2023-01-25 LAB — ADULT ROUTINE BLOOD CULTURE, SET OF 2 BOTTLES (BACTERIA AND YEAST)
BLOOD CULTURE, ROUTINE: NO GROWTH
BLOOD CULTURE, ROUTINE: NO GROWTH

## 2023-01-28 DIAGNOSIS — I96 Gangrene, not elsewhere classified: Secondary | ICD-10-CM

## 2023-01-28 LAB — SURGICAL PATHOLOGY SPECIMEN

## 2023-01-29 LAB — GLOMERULAR BASEMENT MEMBRANE ANTIBODIES, IGG, SERUM
GLOMERULAR BASEMENT MEMBRANE ANTIBODY IGG QUAL: NEGATIVE
GLOMERULAR BASEMENT MEMBRANE ANTIBODY IGG QUANT: <0.2 AI (ref <1.0)

## 2023-01-30 ENCOUNTER — Telehealth (HOSPITAL_COMMUNITY): Payer: Self-pay

## 2023-01-30 NOTE — Telephone Encounter (Signed)
TCM visit completed 7/3 with Dr. Conley Rolls.

## 2023-02-03 NOTE — Progress Notes (Signed)
GENERAL SURGERY, [facility] GENERAL SURGERY  [address]  [place] [address]    Progress Note    Name: Jeremy Sexton MRN:  [id number]   Date: 02/04/2023 DOB:  10/31/1955 (67 y.o.)              Date of Birth:  [birth year]  PCP: Mliss Sax, DO  Referring:  No ref. provider found     HPI:  Jeremy Sexton is a 67 y.o. White male who returns following recent hospital admission with necrotizing soft tissue infection requiring right great toe transmetatarsal amputation.  Discharged home on oral antibiotics and dressing changes.      He states he has not had any pain or other complaints associated with this.  Has noticed a worsening appearance of the ulceration over the course of the past 24 hours.  Had already completed his oral antibiotics and currently not taking any medications.        Past Medical History:   Diagnosis Date    Diabetes mellitus, type 2 (CMS HCC)     High cholesterol     HTN (hypertension)       Past Surgical History:   Procedure Laterality Date    HX BACK SURGERY      HX TONSILLECTOMY        No outpatient medications have been marked as taking for the 02/04/23 encounter (Appointment) with Esmond Plants, MD.      Allergies   Allergen Reactions    Tetracyclines Rash    Tetracycline            There were no vitals taken for this visit.         General: appropriate for age. in no acute distress.    Vital signs are present above and have been reviewed by me     Right foot:  With several areas of devitalized tissue.  Some induration surrounding the wound.  Remains with a palpable posterior tibial pulse.  Some mild granulation tissue formation.          Assessment/Plan:  Assessment/Plan   1. 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  Seems to have adequate arterial inflow.  Hopefully will be able to gain control of his wound to the point where we can  begin some more positive granulation tissue.  He is at risk for limb loss if he would have worsening of the appearance of the foot.  Restart antibiotics.  Patient agreeable to this plan.  All questions answered to his satisfaction      This note was partially created using voice recognition software and is inherently subject to errors including those of syntax and "sound alike " substitutions which may escape proof reading. In such instances, original meaning may be extrapolated by contextual derivation.    Maura Crandall MD MBA CPE FACS

## 2023-02-04 ENCOUNTER — Ambulatory Visit (INDEPENDENT_AMBULATORY_CARE_PROVIDER_SITE_OTHER): Payer: MEDICARE | Admitting: Surgery

## 2023-02-04 ENCOUNTER — Inpatient Hospital Stay (INDEPENDENT_AMBULATORY_CARE_PROVIDER_SITE_OTHER)
Admission: RE | Admit: 2023-02-04 | Discharge: 2023-02-04 | Disposition: A | Discharge status: Home / Self Care | Payer: MEDICARE | Source: Ambulatory Visit

## 2023-02-04 ENCOUNTER — Other Ambulatory Visit: Payer: Self-pay

## 2023-02-04 ENCOUNTER — Other Ambulatory Visit (INDEPENDENT_AMBULATORY_CARE_PROVIDER_SITE_OTHER): Payer: Self-pay | Admitting: Surgery

## 2023-02-04 ENCOUNTER — Encounter (INDEPENDENT_AMBULATORY_CARE_PROVIDER_SITE_OTHER): Payer: Self-pay | Admitting: Surgery

## 2023-02-04 VITALS — BP 148/78 | HR 84 | Temp 97.3°F | Wt 177.0 lb

## 2023-02-04 DIAGNOSIS — S81802A Unspecified open wound, left lower leg, initial encounter: Secondary | ICD-10-CM

## 2023-02-04 DIAGNOSIS — L97514 Non-pressure chronic ulcer of other part of right foot with necrosis of bone: Secondary | ICD-10-CM

## 2023-02-04 DIAGNOSIS — E08621 Diabetes mellitus due to underlying condition with foot ulcer: Secondary | ICD-10-CM

## 2023-02-04 MED ORDER — SULFAMETHOXAZOLE 800 MG-TRIMETHOPRIM 160 MG TABLET
1.0000 | ORAL_TABLET | Freq: Two times a day (BID) | ORAL | 0 refills | Status: DC
Start: 2023-02-04 — End: 2023-02-24

## 2023-02-05 ENCOUNTER — Encounter (INDEPENDENT_AMBULATORY_CARE_PROVIDER_SITE_OTHER): Payer: Self-pay | Admitting: Surgery

## 2023-02-06 ENCOUNTER — Inpatient Hospital Stay
Admission: RE | Admit: 2023-02-06 | Discharge: 2023-02-06 | Disposition: A | Discharge status: Home / Self Care | Payer: MEDICARE | Source: Ambulatory Visit | Attending: Surgery | Admitting: Surgery

## 2023-02-06 ENCOUNTER — Encounter (HOSPITAL_COMMUNITY): Payer: Self-pay | Admitting: Surgery

## 2023-02-06 ENCOUNTER — Encounter (HOSPITAL_COMMUNITY): Payer: MEDICARE | Admitting: Surgery

## 2023-02-06 ENCOUNTER — Ambulatory Visit (HOSPITAL_COMMUNITY): Payer: MEDICARE | Admitting: Certified Registered"

## 2023-02-06 ENCOUNTER — Other Ambulatory Visit: Payer: Self-pay

## 2023-02-06 ENCOUNTER — Encounter (HOSPITAL_COMMUNITY)
Admission: RE | Disposition: A | Discharge status: Home / Self Care | Payer: Self-pay | Source: Ambulatory Visit | Attending: Surgery

## 2023-02-06 DIAGNOSIS — L97519 Non-pressure chronic ulcer of other part of right foot with unspecified severity: Secondary | ICD-10-CM

## 2023-02-06 DIAGNOSIS — Z7984 Long term (current) use of oral hypoglycemic drugs: Secondary | ICD-10-CM

## 2023-02-06 DIAGNOSIS — E11621 Type 2 diabetes mellitus with foot ulcer: Secondary | ICD-10-CM

## 2023-02-06 DIAGNOSIS — I10 Essential (primary) hypertension: Secondary | ICD-10-CM | POA: Insufficient documentation

## 2023-02-06 DIAGNOSIS — E785 Hyperlipidemia, unspecified: Secondary | ICD-10-CM | POA: Insufficient documentation

## 2023-02-06 DIAGNOSIS — Z89411 Acquired absence of right great toe: Secondary | ICD-10-CM

## 2023-02-06 DIAGNOSIS — L03115 Cellulitis of right lower limb: Secondary | ICD-10-CM

## 2023-02-06 DIAGNOSIS — G629 Polyneuropathy, unspecified: Secondary | ICD-10-CM | POA: Insufficient documentation

## 2023-02-06 LAB — POC BLOOD GLUCOSE (RESULTS): GLUCOSE, POC: 127 mg/dl — ABNORMAL HIGH (ref 70–100)

## 2023-02-06 SURGERY — DEBRIDEMENT FOOT
Anesthesia: Monitor Anesthesia Care | Site: Foot | Laterality: Right | Wound class: Dirty or Infected Wounds-Include old traumatic wounds

## 2023-02-06 MED ORDER — LACTATED RINGERS INTRAVENOUS SOLUTION
INTRAVENOUS | Status: DC
Start: 2023-02-06 — End: 2023-02-06

## 2023-02-06 MED ORDER — SODIUM CHLORIDE 0.9 % (FLUSH) INJECTION SYRINGE
3.0000 mL | INJECTION | Freq: Three times a day (TID) | INTRAMUSCULAR | Status: DC
Start: 2023-02-06 — End: 2023-02-06

## 2023-02-06 MED ORDER — ONDANSETRON HCL (PF) 4 MG/2 ML INJECTION SOLUTION
4.0000 mg | Freq: Once | INTRAMUSCULAR | Status: DC | PRN
Start: 2023-02-06 — End: 2023-02-06

## 2023-02-06 MED ORDER — FENTANYL (PF) 50 MCG/ML INJECTION WRAPPER
25.0000 ug | INJECTION | INTRAMUSCULAR | Status: DC | PRN
Start: 2023-02-06 — End: 2023-02-06

## 2023-02-06 MED ORDER — MIDAZOLAM 5 MG/ML INJECTION WRAPPER
2.5000 mg | Freq: Once | INTRAMUSCULAR | Status: DC | PRN
Start: 2023-02-06 — End: 2023-02-06
  Administered 2023-02-06: 2.5 mg via INTRAVENOUS

## 2023-02-06 MED ORDER — ONDANSETRON HCL (PF) 4 MG/2 ML INJECTION SOLUTION
INTRAMUSCULAR | Status: AC
Start: 2023-02-06 — End: 2023-02-06
  Filled 2023-02-06: qty 2

## 2023-02-06 MED ORDER — FAMOTIDINE (PF) 20 MG/2 ML INTRAVENOUS SOLUTION
20.0000 mg | Freq: Once | INTRAVENOUS | Status: AC
Start: 2023-02-06 — End: 2023-02-06
  Administered 2023-02-06: 20 mg via INTRAVENOUS

## 2023-02-06 MED ORDER — SODIUM CHLORIDE 0.9 % (FLUSH) INJECTION SYRINGE
3.0000 mL | INJECTION | INTRAMUSCULAR | Status: DC | PRN
Start: 2023-02-06 — End: 2023-02-06

## 2023-02-06 MED ORDER — LACTATED RINGERS INTRAVENOUS SOLUTION
INTRAVENOUS | Status: DC | PRN
Start: 2023-02-06 — End: 2023-02-06
  Administered 2023-02-06: 0 via INTRAVENOUS

## 2023-02-06 MED ORDER — ALBUTEROL SULFATE 2.5 MG/3 ML (0.083 %) SOLUTION FOR NEBULIZATION
2.5000 mg | INHALATION_SOLUTION | Freq: Once | RESPIRATORY_TRACT | Status: DC | PRN
Start: 2023-02-06 — End: 2023-02-06

## 2023-02-06 MED ORDER — FENTANYL (PF) 50 MCG/ML INJECTION SOLUTION
INTRAMUSCULAR | Status: AC
Start: 2023-02-06 — End: 2023-02-06
  Filled 2023-02-06: qty 2

## 2023-02-06 MED ORDER — IPRATROPIUM 0.5 MG-ALBUTEROL 3 MG (2.5 MG BASE)/3 ML NEBULIZATION SOLN
3.0000 mL | INHALATION_SOLUTION | Freq: Once | RESPIRATORY_TRACT | Status: DC | PRN
Start: 2023-02-06 — End: 2023-02-06

## 2023-02-06 MED ORDER — ONDANSETRON HCL (PF) 4 MG/2 ML INJECTION SOLUTION
4.0000 mg | Freq: Once | INTRAMUSCULAR | Status: AC
Start: 2023-02-06 — End: 2023-02-06
  Administered 2023-02-06: 4 mg via INTRAVENOUS

## 2023-02-06 MED ORDER — FENTANYL (PF) 50 MCG/ML INJECTION WRAPPER
INJECTION | Freq: Once | INTRAMUSCULAR | Status: DC | PRN
Start: 2023-02-06 — End: 2023-02-06
  Administered 2023-02-06 (×2): 50 ug via INTRAVENOUS

## 2023-02-06 MED ORDER — DEXAMETHASONE SODIUM PHOSPHATE 4 MG/ML INJECTION SOLUTION
4.0000 mg | Freq: Once | INTRAMUSCULAR | Status: AC
Start: 2023-02-06 — End: 2023-02-06
  Administered 2023-02-06: 4 mg via INTRAVENOUS

## 2023-02-06 MED ORDER — PROPOFOL 10 MG/ML IV BOLUS
INJECTION | Freq: Once | INTRAVENOUS | Status: DC | PRN
Start: 2023-02-06 — End: 2023-02-06
  Administered 2023-02-06: 30 mg via INTRAVENOUS
  Administered 2023-02-06: 50 mg via INTRAVENOUS
  Administered 2023-02-06: 100 mg via INTRAVENOUS
  Administered 2023-02-06: 30 mg via INTRAVENOUS
  Administered 2023-02-06 (×2): 20 mg via INTRAVENOUS

## 2023-02-06 MED ORDER — ONDANSETRON HCL (PF) 4 MG/2 ML INJECTION SOLUTION
4.0000 mg | Freq: Four times a day (QID) | INTRAMUSCULAR | Status: DC | PRN
Start: 2023-02-06 — End: 2023-02-06

## 2023-02-06 MED ORDER — HYDROCODONE 5 MG-ACETAMINOPHEN 325 MG TABLET
1.0000 | ORAL_TABLET | ORAL | Status: DC | PRN
Start: 2023-02-06 — End: 2023-02-06

## 2023-02-06 MED ORDER — FENTANYL (PF) 50 MCG/ML INJECTION WRAPPER
50.0000 ug | INJECTION | INTRAMUSCULAR | Status: DC | PRN
Start: 2023-02-06 — End: 2023-02-06

## 2023-02-06 MED ORDER — MIDAZOLAM 5 MG/ML INJECTION WRAPPER
INTRAMUSCULAR | Status: AC
Start: 2023-02-06 — End: 2023-02-06
  Filled 2023-02-06: qty 1

## 2023-02-06 MED ORDER — PROCHLORPERAZINE EDISYLATE 10 MG/2 ML (5 MG/ML) INJECTION SOLUTION
5.0000 mg | Freq: Once | INTRAMUSCULAR | Status: DC | PRN
Start: 2023-02-06 — End: 2023-02-06

## 2023-02-06 MED ORDER — DEXAMETHASONE SODIUM PHOSPHATE 4 MG/ML INJECTION SOLUTION
INTRAMUSCULAR | Status: AC
Start: 2023-02-06 — End: 2023-02-06
  Filled 2023-02-06: qty 1

## 2023-02-06 MED ORDER — FAMOTIDINE (PF) 20 MG/2 ML INTRAVENOUS SOLUTION
INTRAVENOUS | Status: AC
Start: 2023-02-06 — End: 2023-02-06
  Filled 2023-02-06: qty 2

## 2023-02-06 MED ORDER — LIDOCAINE (PF) 100 MG/5 ML (2 %) INTRAVENOUS SYRINGE
INJECTION | Freq: Once | INTRAVENOUS | Status: DC | PRN
Start: 2023-02-06 — End: 2023-02-06
  Administered 2023-02-06: 100 mg via INTRAVENOUS

## 2023-02-06 SURGICAL SUPPLY — 39 items
BANDAGE 3.6YDX3.4IN 6 PLY HYPOALL LOFT LIGHT STRCH COTTON GAUZE STRL LF  DISP (WOUND CARE SUPPLY) ×1 IMPLANT
BANDAGE 82X6IN STRL CNFRM STRCH GAUZE COMPRESS LF DISP (WOUND CARE SUPPLY) IMPLANT
BANDAGE SFFRM 75X4IN STRCH FNSH EDGE ABS PLSTR RYN GAUZE STRL LF  DISP (WOUND CARE SUPPLY) IMPLANT
BLADE 10 2 END CBNSTL SURG STRL DISP (SURGICAL CUTTING SUPPLIES) ×1 IMPLANT
BLADE 15 2 END CBNSTL SURG STRL DISP (SURGICAL CUTTING SUPPLIES) ×1 IMPLANT
CLEANER ESURG TIP 2X2IN TIP POLISHR CAUT STRL LF (SURGICAL CUTTING SUPPLIES) ×1 IMPLANT
COMPRESS THERAPY COBAN 5.1YDX4IN 2.9YDX4IN 2 LYR FULL STRCH HYPOALL LT COMPRESS THN SYSTEM LF  TAN (WOUND CARE/ENTEROSTOMAL SUPPLY) ×1
CONV USE 123874 - SYRINGE AMSURE MDCHC 60CC LF  STRL TIP PRTC SM TUBE ADPR IRRG DISC BULB POLYPROP (MED SURG SUPPLIES) ×1 IMPLANT
CONV USE ITEM 343167 - COMPRESS THERAPY COBAN 5.1YDX4IN 2.9YDX4IN 2 LYR FULL STRCH HYPOALL LT COMPRESS THN SYSTEM LF  TAN (WOUND CARE SUPPLY) ×1 IMPLANT
COUNTER 20 CNT BLOCK ADH NEEDLE STRL LF  RD SHARP FOAM 15.75X11.5X14IN DISP (MED SURG SUPPLIES) ×1 IMPLANT
COVER 53X24IN MAYOSTAND PRXM STRL DISP EQP SMS LF (DRAPE/PACKS/SHEETS/OR TOWEL) ×1 IMPLANT
COVER TBL 90X50IN STD SMS REINF FNFLD STRL LF  DISP (DRAPE/PACKS/SHEETS/OR TOWEL) ×2 IMPLANT
DETERGENT INSTR 22OZ TRNSPT GEL RINSE FREE NEUT PH PREKLENZ CLR PLSNT LF (MISCELLANEOUS PT CARE ITEMS) ×1 IMPLANT
DRAPE FNFLD ABS REINF 77X53IN 43528 PRXM LF  STRL DISP SURG SMS 44X23IN (DRAPE/PACKS/SHEETS/OR TOWEL) ×2 IMPLANT
ELECTRODE ESURG BLADE PNCL 15FT VLAB EDGE TELESCP SMOKE EVAC (SURGICAL CUTTING SUPPLIES) ×1 IMPLANT
GLOVE SURG 6 LF  PF BEAD CUF STRL CRM 11.3IN PROTEXIS PLISPRN THK9.1 MIL (GLOVES AND ACCESSORIES) IMPLANT
GLOVE SURG 6 LF  PF SMOOTH BEAD CUF INTLK STRL BLU 11.3IN PROTEXIS NEU-THERA PLISPRN THK7.9 MIL (GLOVES AND ACCESSORIES) IMPLANT
GLOVE SURG 6.5 LF  PF BEAD CUF STRL CRM 11.3IN PROTEXIS PI PLISPRN THK9.1 MIL (GLOVES AND ACCESSORIES) IMPLANT
GLOVE SURG 6.5 LF  PF SMOOTH BEAD CUF INTLK STRL BLU 11.3IN PROTEXIS NEU-THERA PLISPRN THK7.9 MIL (GLOVES AND ACCESSORIES) ×1 IMPLANT
GLOVE SURG 6.5 LTX PF SMOOTH BEAD CUF STRL YW 11.5IN PROTEXIS NEU-THERA DDRGL THK8.7 MIL (GLOVES AND ACCESSORIES) IMPLANT
GLOVE SURG 7 LF  PF BEAD CUF STRL CRM 11.8IN PROTEXIS PI PLISPRN THK9.1 MIL (GLOVES AND ACCESSORIES) ×1 IMPLANT
GLOVE SURG 7 LF  PF SMOOTH BEAD CUF INTLK STRL BLU 11.8IN PROTEXIS NEU-THERA PLISPRN THK7.9 MIL (GLOVES AND ACCESSORIES) IMPLANT
GLOVE SURG 7 LTX PF SMOOTH BEAD CUF STRL YW 12IN PROTEXIS NEU-THERA DDRGL THK8.7 MIL (GLOVES AND ACCESSORIES) IMPLANT
GLOVE SURG 7.5 LF  PF BEAD CUF STRL CRM 11.8IN PROTEXIS PI PLISPRN THK9.1 MIL (GLOVES AND ACCESSORIES) IMPLANT
GLOVE SURG 7.5 LF  PF SMOOTH BEAD CUF INTLK STRL BLU 11.8IN PROTEXIS NEU-THERA PLISPRN THK7.9 MIL (GLOVES AND ACCESSORIES) IMPLANT
GLOVE SURG 7.5 LTX PF SMOOTH BEAD CUF STRL YW 12IN PROTEXIS (GLOVES AND ACCESSORIES) ×1 IMPLANT
GLOVE SURG 8 LF  PF BEAD CUF STRL CRM 11.8IN PROTEXIS PI PLISPRN THK9.1 MIL (GLOVES AND ACCESSORIES) IMPLANT
GLOVE SURG 8 LF  PF SMOOTH BEAD CUF INTLK STRL BLU 11.8IN PROTEXIS NEU-THERA PLISPRN THK7.9 MIL (GLOVES AND ACCESSORIES) IMPLANT
GLOVE SURG 8.5 LF  PF BEAD CUF STRL CRM 11.8IN PROTEXIS PI PLISPRN THK9.1 MIL (GLOVES AND ACCESSORIES) IMPLANT
GOWN SURG LRG STD LGTH L3 HKLP CLSR RGLN SLEEVE TWL STRL LF  DISP GRN AERO BLU PRFRM FBRC (DRAPE/PACKS/SHEETS/OR TOWEL) ×1 IMPLANT
GOWN SURG XL STD LGTH L3 HKLP CLSR RGLN SLEEVE TWL STRL LF  DISP GRN AERO BLU PRFRM FBRC (DRAPE/PACKS/SHEETS/OR TOWEL) ×1 IMPLANT
LABEL MED CORRECT MED LABELING SYS 4 FLG 2 SHEET 24 PRPRNT STRL (MED SURG SUPPLIES) ×1 IMPLANT
PAD ABDOMINAL 8X7.5IN LF  STRL (WOUND CARE SUPPLY) IMPLANT
SOL IRRG 0.9% NACL 1000ML PLASTIC PR BTL ISTNC N-PYRG STRL LF (MEDICATIONS/SOLUTIONS) ×1 IMPLANT
SPONGE GAUZE 4X4IN MDCHC COTTON 12 PLY TY 7 LF  STRL DISP (WOUND CARE SUPPLY) ×3 IMPLANT
SWAB BD BBL BD CLTSWB LIQUID STRT MED RYN TMPR EVD SEAL 2 ACT CAP RND BTM TUBE 5.25IN STRL CULT LF (SPECIMEN COLLECTION SUPPLIES) IMPLANT
SYRINGE AMSURE MDCHC 60CC LF  STRL TIP PRTC SM TUBE ADPR IRRG DISC BULB POLYPROP (MED SURG SUPPLIES) ×1
TOWEL 24X16IN COTTON BLU DISP SURG STRL LF (DRAPE/PACKS/SHEETS/OR TOWEL) ×2 IMPLANT
TUBING SUCT CLR 6FT .25IN ARGYLE PVC NCDTV STR MALE FEMALE MLD CONN STRL LF (MED SURG SUPPLIES) IMPLANT

## 2023-02-06 NOTE — Anesthesia Transfer of Care (Signed)
ANESTHESIA TRANSFER OF CARE   Jeremy Sexton is a 67 y.o. ,male, Weight: 81.2 kg (179 lb)   had Procedure(s):  DEBRIDEMENT OF RIGHT FOOT  performed  02/06/23   Primary Service: Esmond Plants, MD    Past Medical History:   Diagnosis Date   . Diabetes mellitus, type 2 (CMS HCC)    . High cholesterol    . HTN (hypertension)       Allergy History as of 02/06/23       TETRACYCLINE         Noted Status Severity Type Reaction    11/17/22 1148 Young, [place], RN 01/14/17 Active                 TETRACYCLINES         Noted Status Severity Type Reaction    01/20/23 [address], RN 01/20/23 Active Medium  Rash                  I completed my transfer of care / handoff to the receiving personnel during which we discussed:  Access, Airway, All key/critical aspects of case discussed, Analgesia, Antibiotics, Expectation of post procedure, Fluids/Product, Gave opportunity for questions and acknowledgement of understanding, Labs and PMHx      Post Location: PACU                                                           Last OR Temp: Temperature: 36.2 C (97.1 F)  ABG:  POTASSIUM   Date Value Ref Range Status   01/22/2023 3.0 (L) 3.5 - 5.1 mmol/L Final     KAPPA FREE LIGHT CHAINS   Date Value Ref Range Status   01/15/2023 1.82 1.25 - 3.25 mg/dL Final   78/29/5621 3.08 1.25 - 3.25 mg/dL Final     KAPPA/LAMBDA FLC RATIO   Date Value Ref Range Status   01/15/2023 1.17 0.80 - 2.10 Final   01/15/2023 1.17 0.80 - 2.10 Final     CALCIUM   Date Value Ref Range Status   01/22/2023 8.6 8.6 - 10.3 mg/dL Final     Calculated P Axis   Date Value Ref Range Status   01/20/2023 0 degrees Final     Calculated R Axis   Date Value Ref Range Status   01/20/2023 28 degrees Final     Calculated T Axis   Date Value Ref Range Status   01/20/2023 25 degrees Final     Airway:* No LDAs found *  Blood pressure (!) 95/51, pulse 53, temperature 36.2 C (97.1 F), resp. rate 13, height 1.803 m (5\' 11" ), weight 81.2 kg (179 lb), SpO2 100%.

## 2023-02-06 NOTE — Discharge Instructions (Addendum)
Activity/Diet/Care Instructions  DISCHARGE INSTRUCTION - ACTIVITY  No lifting until approved at post operative appointment  Activity:  AS TOLERATED  AS INSTRUCTED  DISCHARGE INSTRUCTION - DIET  Diet:  RESUME HOME DIET    Durable Medical Equip/Home Health/Schedule Follow-up/Referrals  FOLLOW-UP: GENERAL SURGERY ([facility]) - PRN - [facility] - , [place]  Follow-up in:  JULY 29TH AT 0945 AM  Reason for visit:  HOSPITAL DISCHARGE  Post Discharge Destination:  Home  Wound Care Dressing  Wet-to-dry dressing change b.i.d. to foot wound starting tomorrow  Primary Dressing:  4x4 Gauze  Change Dressing:  Other (Specify in Comments) Comment - B.i.d. - TWICE A DAY

## 2023-02-06 NOTE — Anesthesia Preprocedure Evaluation (Addendum)
ANESTHESIA PRE-OP EVALUATION  Planned Procedure: DEBRIDEMENT OF RIGHT FOOT (Right: Foot)  Review of Systems                   Pulmonary     Cardiovascular    Hypertension and hyperlipidemia ,No peripheral edema,        GI/Hepatic/Renal           Endo/Other      type 2 diabetes/ controlled    Neuro/Psych/MS       peripheral neuropathy,  Cancer                        Physical Assessment      Airway       Mallampati: III    TM distance: 3 FB    Neck ROM: full  Mouth Opening: good.  Facial hair  Beard        Dental           (+) edentulous, upper dentures, lower dentures           Pulmonary    Breath sounds clear to auscultation  (-) no rhonchi, no decreased breath sounds, no wheezes, no rales and no stridor     Cardiovascular    Rhythm: regular  Rate: Normal  (-) no friction rub, carotid bruit is not present, no peripheral edema and no murmur     Other findings              Plan  ASA 3     Planned anesthesia type: MAC

## 2023-02-06 NOTE — OR Surgeon (Signed)
[facility]      Patient Name: Jeremy, Sexton Number: [id number]  Date of Service: 02/06/2023   Date of Birth: Dec 11, 1955      Pre-Operative Diagnosis:  Right foot diabetic ulceration    Post-Operative Diagnosis:  Same    Procedure(s)/Description:  DEBRIDEMENT OF RIGHT FOOT excisional through skin, subcutaneous tissue and fascia    Attending Surgeon: Esmond Plants, MD     Anesthesia:  Anesthesiologist: Delle Reining, MD  CRNA: Lauro Regulus, CRNA    Anesthesia Type: .Monitor Anesthesia Care     Estimated Blood Loss:  Minimal    Patient was taken to the operating room.  Given appropriate intravenous sedation.  Right foot prepped and draped in usual sterile fashion.  Excisional debridement was undertaken using both scalpel and scissors.  Excision of portions of full-thickness skin, subcutaneous tissue and underlying fascia.  No bone was debrided.  The head of the 2nd metatarsal is directly related to the base of the wound but was not exposed at this time.  The overall size of the debrided wound was a proximally 12 by 6 cm in area.  Hemostasis assured with direct pressure and electrocautery.  Dressed with Betadine-soaked gauze.  Tolerated without difficulty.    Maura Crandall MD MBA CPE FACS

## 2023-02-06 NOTE — Anesthesia Postprocedure Evaluation (Signed)
Anesthesia Post Op Evaluation    Patient: Jeremy Sexton  Procedure(s):  DEBRIDEMENT OF RIGHT FOOT    Last Vitals:Temperature: 36.2 C (97.1 F) (02/06/23 0922)  Heart Rate: 58 (02/06/23 0935)  BP (Non-Invasive): 99/65 (02/06/23 0935)  Respiratory Rate: 12 (02/06/23 0935)  SpO2: 99 % (02/06/23 0935)    No notable events documented.    Patient is sufficiently recovered from the effects of anesthesia to participate in the evaluation and has returned to their pre-procedure level.  Patient location during evaluation: PACU       Patient participation: complete - patient participated  Level of consciousness: awake and alert and responsive to verbal stimuli    Pain score: 0  Pain management: adequate  Airway patency: patent    Anesthetic complications: no  Cardiovascular status: acceptable  Respiratory status: acceptable  Hydration status: acceptable  Patient post-procedure temperature: Pt Normothermic   PONV Status: Absent

## 2023-02-06 NOTE — H&P (View-Only) (Signed)
[facility]  [address]  [place] [address]    Progress Note    Name: Jeremy Sexton MRN:  [id number]   Date: 02/04/2023 DOB:  [birth year] (67 y.o.)              Date of Birth:  [birth year]  PCP: Mliss Sax, DO  Referring:  Esmond Plants     HPI:  Jeremy Sexton is a 67 y.o. White male who returns following recent hospital admission with necrotizing soft tissue infection requiring right great toe transmetatarsal amputation.  Discharged home on oral antibiotics and dressing changes.      He states he has not had any pain or other complaints associated with this.  Has noticed a worsening appearance of the ulceration over the course of the past 24 hours.  Had already completed his oral antibiotics and currently not taking any medications.        Past Medical History:   Diagnosis Date    Diabetes mellitus, type 2 (CMS HCC)     High cholesterol     HTN (hypertension)       Past Surgical History:   Procedure Laterality Date    HX BACK SURGERY      HX TONSILLECTOMY      TOE AMPUTATION Right     great toe      Outpatient Medications Marked as Taking for the 02/06/23 encounter [facility] Encounter)   Medication Sig    acetaminophen (TYLENOL) 500 mg Oral Tablet Take 1 Tablet (500 mg total) by mouth Every 4 hours as needed for Pain    atenoloL (TENORMIN) 50 mg Oral Tablet Take 1 Tablet (50 mg total) by mouth Once a day    atorvastatin (LIPITOR) 20 mg Oral Tablet Take 1 Tablet (20 mg total) by mouth Every evening    cloNIDine HCL (CATAPRES) 0.1 mg Oral Tablet Take 1 Tablet (0.1 mg total) by mouth Twice daily    gabapentin (NEURONTIN) 300 mg Oral Capsule Take 1 Capsule (300 mg total) by mouth Three times a day    lisinopriL (PRINIVIL) 10 mg Oral Tablet Take 1 Tablet (10 mg total) by mouth Once a day    metFORMIN (GLUCOPHAGE) 500 mg Oral Tablet Take 1 Tablet (500 mg total) by mouth Twice daily with food    omeprazole (PRILOSEC) 40 mg Oral Capsule, Delayed Release(E.C.) Take 1 Capsule (40 mg  total) by mouth Once a day    traZODone (DESYREL) 50 mg Oral Tablet Take 1 Tablet (50 mg total) by mouth Every night    trimethoprim-sulfamethoxazole (BACTRIM DS) 160-800mg  per tablet Take 1 Tablet (160 mg total) by mouth Twice daily      Allergies   Allergen Reactions    Tetracyclines Rash    Tetracycline            BP (!) 118/54   Pulse 58   Temp 36.4 C (97.6 F)   Resp 18   Ht 1.803 m (5\' 11" )   Wt 81.2 kg (179 lb)   SpO2 98%   BMI 24.97 kg/m          General: appropriate for age. in no acute distress.    Vital signs are present above and have been reviewed by me     Right foot:  With several areas of devitalized tissue.  Some induration surrounding the wound.  Remains with a palpable posterior tibial pulse.  Some mild granulation tissue formation.  Needs repeat debridement of his wound.  Seems to have adequate arterial inflow.  Hopefully will be able to gain control of his wound to the point where we can begin some more positive granulation tissue.  He is at risk for limb loss if he would have worsening of the appearance of the foot.  Restart antibiotics.  Patient agreeable to this plan.  All questions answered to his satisfaction      This note was partially created using voice recognition software and is inherently subject to errors including those of syntax and "sound alike " substitutions which may escape proof reading. In such instances, original meaning may be extrapolated by contextual derivation.    Maura Crandall MD MBA CPE FACS

## 2023-02-06 NOTE — H&P (Signed)
[facility]  [address]  [place] [address]    Progress Note    Name: Jeremy Sexton MRN:  [id number]   Date: 02/04/2023 DOB:  [birth year] (67 y.o.)              Date of Birth:  [birth year]  PCP: Jeremy Sax, DO  Referring:  Jeremy Sexton     HPI:  Jeremy Sexton is a 67 y.o. White male who returns following recent hospital admission with necrotizing soft tissue infection requiring right great toe transmetatarsal amputation.  Discharged home on oral antibiotics and dressing changes.      He states he has not had any pain or other complaints associated with this.  Has noticed a worsening appearance of the ulceration over the course of the past 24 hours.  Had already completed his oral antibiotics and currently not taking any medications.        Past Medical History:   Diagnosis Date    Diabetes mellitus, type 2 (CMS HCC)     High cholesterol     HTN (hypertension)       Past Surgical History:   Procedure Laterality Date    HX BACK SURGERY      HX TONSILLECTOMY      TOE AMPUTATION Right     great toe      Outpatient Medications Marked as Taking for the 02/06/23 encounter [facility] Encounter)   Medication Sig    acetaminophen (TYLENOL) 500 mg Oral Tablet Take 1 Tablet (500 mg total) by mouth Every 4 hours as needed for Pain    atenoloL (TENORMIN) 50 mg Oral Tablet Take 1 Tablet (50 mg total) by mouth Once a day    atorvastatin (LIPITOR) 20 mg Oral Tablet Take 1 Tablet (20 mg total) by mouth Every evening    cloNIDine HCL (CATAPRES) 0.1 mg Oral Tablet Take 1 Tablet (0.1 mg total) by mouth Twice daily    gabapentin (NEURONTIN) 300 mg Oral Capsule Take 1 Capsule (300 mg total) by mouth Three times a day    lisinopriL (PRINIVIL) 10 mg Oral Tablet Take 1 Tablet (10 mg total) by mouth Once a day    metFORMIN (GLUCOPHAGE) 500 mg Oral Tablet Take 1 Tablet (500 mg total) by mouth Twice daily with food    omeprazole (PRILOSEC) 40 mg Oral Capsule, Delayed Release(E.C.) Take 1 Capsule (40 mg  total) by mouth Once a day    traZODone (DESYREL) 50 mg Oral Tablet Take 1 Tablet (50 mg total) by mouth Every night    trimethoprim-sulfamethoxazole (BACTRIM DS) 160-800mg  per tablet Take 1 Tablet (160 mg total) by mouth Twice daily      Allergies   Allergen Reactions    Tetracyclines Rash    Tetracycline            BP (!) 118/54   Pulse 58   Temp 36.4 C (97.6 F)   Resp 18   Ht 1.803 m (5\' 11" )   Wt 81.2 kg (179 lb)   SpO2 98%   BMI 24.97 kg/m          General: appropriate for age. in no acute distress.    Vital signs are present above and have been reviewed by me     Right foot:  With several areas of devitalized tissue.  Some induration surrounding the wound.  Remains with a palpable posterior tibial pulse.  Some mild granulation tissue formation.  Needs repeat debridement of his wound.  Seems to have adequate arterial inflow.  Hopefully will be able to gain control of his wound to the point where we can begin some more positive granulation tissue.  He is at risk for limb loss if he would have worsening of the appearance of the foot.  Restart antibiotics.  Patient agreeable to this plan.  All questions answered to his satisfaction      This note was partially created using voice recognition software and is inherently subject to errors including those of syntax and "sound alike " substitutions which may escape proof reading. In such instances, original meaning may be extrapolated by contextual derivation.    Maura Crandall MD MBA CPE FACS

## 2023-02-10 ENCOUNTER — Telehealth (INDEPENDENT_AMBULATORY_CARE_PROVIDER_SITE_OTHER): Payer: Self-pay | Admitting: Surgery

## 2023-02-13 ENCOUNTER — Encounter (INDEPENDENT_AMBULATORY_CARE_PROVIDER_SITE_OTHER): Payer: Self-pay

## 2023-02-21 NOTE — Progress Notes (Signed)
GENERAL SURGERY, [facility] GENERAL SURGERY  [address]  [place] [address]    Progress Note    Name: ERRICK BRODT MRN:  [id number]   Date: 02/23/2023 DOB:  1956/01/15 (67 y.o.)              Date of Birth:  03-26-1956  PCP: Mliss Sax, DO  Referring:  No ref. provider found     HPI:  Jeremy Sexton is a 67 y.o. White male who returns following debridement of his right foot by me during recent hospitalization February 06, 2023.  Had subsequent debridement following that as an outpatient.  Continuing dressing changes.  Has completed all oral antibiotics.  Has no sensation this foot so has no complaints with it.  Has been able to ambulate on it without limitation.        Past Medical History:   Diagnosis Date    Diabetes mellitus, type 2 (CMS HCC)     High cholesterol     HTN (hypertension)       Past Surgical History:   Procedure Laterality Date    HX BACK SURGERY      HX TONSILLECTOMY      TOE AMPUTATION Right     great toe      No outpatient medications have been marked as taking for the 02/23/23 encounter (Appointment) with Esmond Plants, MD.      Allergies   Allergen Reactions    Tetracyclines Rash    Tetracycline            There were no vitals taken for this visit.         General: appropriate for age. in no acute distress.    Vital signs are present above and have been reviewed by me     HEENT: Atraumatic, Normocephalic.    Lungs: Nonlabored breathing with symmetric expansion    Heart:Regular wth respect to rate.    Abdomen:Soft. Nontender. Nondistended     Right foot continued unfavorable progression of his foot.  There seems to be exposed bone associated with the 2nd metatarsophalangeal joint space on the plantar surface of his foot now.  Significant cyanosis associated with 3rd toe.    Psychiatric: Alert and oriented to person, place, and time. affect appropriate       Assessment/Plan:  Assessment/Plan   1. Diabetic ulcer of other part of right foot associated with diabetes mellitus due to  underlying condition, unspecified ulcer stage (CMS HCC)         Patient continues to have unfavorable progression of his foot.  Needs additional surgery this time with removal of the 2nd and 3rd digits most likely.  There seems to be some beginnings of granulation tissue on the more proximal portion of the wound.  Have had palpable pulse as well as a normal noninvasive study but will try and obtain a contrast study of his lower extremity to make sure there is no correctable arterial lesion.  Very long discussion held with the patient and his son about the fact that without improvement of this at some point the possibility of limb loss continues to increase.      This note was partially created using voice recognition software and is inherently subject to errors including those of syntax and "sound alike " substitutions which may escape proof reading. In such instances, original meaning may be extrapolated by contextual derivation.    Maura Crandall MD MBA CPE FACS

## 2023-02-23 ENCOUNTER — Ambulatory Visit (INDEPENDENT_AMBULATORY_CARE_PROVIDER_SITE_OTHER): Payer: MEDICARE | Admitting: Surgery

## 2023-02-23 ENCOUNTER — Encounter (INDEPENDENT_AMBULATORY_CARE_PROVIDER_SITE_OTHER): Payer: Self-pay | Admitting: Surgery

## 2023-02-23 ENCOUNTER — Other Ambulatory Visit: Payer: Self-pay

## 2023-02-23 ENCOUNTER — Other Ambulatory Visit: Payer: MEDICARE | Attending: Surgery

## 2023-02-23 ENCOUNTER — Other Ambulatory Visit (INDEPENDENT_AMBULATORY_CARE_PROVIDER_SITE_OTHER): Payer: Self-pay | Admitting: Surgery

## 2023-02-23 VITALS — BP 108/58 | HR 77 | Wt 181.0 lb

## 2023-02-23 DIAGNOSIS — Z01818 Encounter for other preprocedural examination: Secondary | ICD-10-CM

## 2023-02-23 DIAGNOSIS — E08621 Diabetes mellitus due to underlying condition with foot ulcer: Secondary | ICD-10-CM

## 2023-02-23 DIAGNOSIS — L97519 Non-pressure chronic ulcer of other part of right foot with unspecified severity: Secondary | ICD-10-CM

## 2023-02-23 LAB — CBC WITH DIFF
BASOPHIL #: 0 10*3/uL (ref 0.00–0.10)
BASOPHIL %: 1 % (ref 0–1)
EOSINOPHIL #: 0.2 10*3/uL (ref 0.00–0.50)
EOSINOPHIL %: 3 % (ref 1–8)
HCT: 31.1 % — ABNORMAL LOW (ref 36.7–47.1)
HGB: 10.4 g/dL — ABNORMAL LOW (ref 12.5–16.3)
LYMPHOCYTE #: 1.6 10*3/uL (ref 1.00–3.00)
LYMPHOCYTE %: 22 % (ref 16–44)
MCH: 28.7 pg (ref 23.8–33.4)
MCHC: 33.4 g/dL (ref 32.5–36.3)
MCV: 86 fL (ref 73.0–96.2)
MONOCYTE #: 0.7 10*3/uL (ref 0.30–1.00)
MONOCYTE %: 10 % (ref 5–13)
MPV: 6.7 fL — ABNORMAL LOW (ref 7.4–11.4)
NEUTROPHIL #: 4.6 10*3/uL (ref 1.85–7.80)
NEUTROPHIL %: 64 % (ref 43–77)
PLATELETS: 372 10*3/uL (ref 140–440)
RBC: 3.62 10*6/uL — ABNORMAL LOW (ref 4.06–5.63)
RDW: 14.6 % (ref 12.1–16.2)
WBC: 7.2 10*3/uL (ref 3.6–10.2)

## 2023-02-23 LAB — BASIC METABOLIC PANEL
ANION GAP: 8 mmol/L (ref 4–13)
BUN/CREA RATIO: 18 (ref 6–22)
BUN: 13 mg/dL (ref 7–25)
CALCIUM: 9.4 mg/dL (ref 8.6–10.3)
CHLORIDE: 104 mmol/L (ref 98–107)
CO2 TOTAL: 24 mmol/L (ref 21–31)
CREATININE: 0.74 mg/dL (ref 0.60–1.30)
ESTIMATED GFR: 100 mL/min/{1.73_m2} (ref >59)
GLUCOSE: 121 mg/dL — ABNORMAL HIGH (ref 74–109)
OSMOLALITY, CALCULATED: 273 mOsm/kg (ref 270–290)
POTASSIUM: 3.8 mmol/L (ref 3.5–5.1)
SODIUM: 136 mmol/L (ref 136–145)

## 2023-02-24 ENCOUNTER — Inpatient Hospital Stay (HOSPITAL_COMMUNITY): Payer: MEDICARE

## 2023-02-24 ENCOUNTER — Ambulatory Visit (HOSPITAL_COMMUNITY): Payer: MEDICARE | Admitting: Anesthesiology

## 2023-02-24 ENCOUNTER — Encounter (INDEPENDENT_AMBULATORY_CARE_PROVIDER_SITE_OTHER): Payer: Self-pay | Admitting: Surgery

## 2023-02-24 ENCOUNTER — Encounter (HOSPITAL_COMMUNITY): Payer: Self-pay | Admitting: Surgery

## 2023-02-24 ENCOUNTER — Inpatient Hospital Stay
Admission: RE | Admit: 2023-02-24 | Discharge: 2023-02-24 | Disposition: A | Discharge status: Home / Self Care | Payer: MEDICARE | Source: Ambulatory Visit | Attending: Surgery | Admitting: Surgery

## 2023-02-24 ENCOUNTER — Encounter (HOSPITAL_COMMUNITY)
Admission: RE | Disposition: A | Discharge status: Home / Self Care | Payer: Self-pay | Source: Ambulatory Visit | Attending: Surgery

## 2023-02-24 ENCOUNTER — Encounter (HOSPITAL_COMMUNITY): Payer: MEDICARE | Admitting: Surgery

## 2023-02-24 DIAGNOSIS — E785 Hyperlipidemia, unspecified: Secondary | ICD-10-CM | POA: Insufficient documentation

## 2023-02-24 DIAGNOSIS — E1169 Type 2 diabetes mellitus with other specified complication: Secondary | ICD-10-CM | POA: Insufficient documentation

## 2023-02-24 DIAGNOSIS — G629 Polyneuropathy, unspecified: Secondary | ICD-10-CM | POA: Insufficient documentation

## 2023-02-24 DIAGNOSIS — M869 Osteomyelitis, unspecified: Secondary | ICD-10-CM | POA: Insufficient documentation

## 2023-02-24 DIAGNOSIS — I10 Essential (primary) hypertension: Secondary | ICD-10-CM | POA: Insufficient documentation

## 2023-02-24 DIAGNOSIS — E11621 Type 2 diabetes mellitus with foot ulcer: Secondary | ICD-10-CM | POA: Insufficient documentation

## 2023-02-24 DIAGNOSIS — Z7984 Long term (current) use of oral hypoglycemic drugs: Secondary | ICD-10-CM | POA: Insufficient documentation

## 2023-02-24 DIAGNOSIS — L97519 Non-pressure chronic ulcer of other part of right foot with unspecified severity: Secondary | ICD-10-CM | POA: Insufficient documentation

## 2023-02-24 LAB — POC BLOOD GLUCOSE (RESULTS): GLUCOSE, POC: 145 mg/dl — ABNORMAL HIGH (ref 70–100)

## 2023-02-24 SURGERY — AMPUTATION TOE
Anesthesia: Monitor Anesthesia Care | Site: Toe | Laterality: Right | Wound class: Dirty or Infected Wounds-Include old traumatic wounds

## 2023-02-24 MED ORDER — MIDAZOLAM 5 MG/ML INJECTION WRAPPER
INTRAMUSCULAR | Status: AC
Start: 2023-02-24 — End: 2023-02-24
  Filled 2023-02-24: qty 1

## 2023-02-24 MED ORDER — LACTATED RINGERS INTRAVENOUS SOLUTION
INTRAVENOUS | Status: DC
Start: 2023-02-24 — End: 2023-02-24

## 2023-02-24 MED ORDER — FENTANYL (PF) 50 MCG/ML INJECTION WRAPPER
INJECTION | Freq: Once | INTRAMUSCULAR | Status: DC | PRN
Start: 2023-02-24 — End: 2023-02-24
  Administered 2023-02-24 (×2): 50 ug via INTRAVENOUS

## 2023-02-24 MED ORDER — FENTANYL (PF) 50 MCG/ML INJECTION WRAPPER
50.0000 ug | INJECTION | INTRAMUSCULAR | Status: DC | PRN
Start: 2023-02-24 — End: 2023-02-24

## 2023-02-24 MED ORDER — FAMOTIDINE (PF) 20 MG/2 ML INTRAVENOUS SOLUTION
20.0000 mg | Freq: Once | INTRAVENOUS | Status: AC
Start: 2023-02-24 — End: 2023-02-24
  Administered 2023-02-24: 20 mg via INTRAVENOUS

## 2023-02-24 MED ORDER — FAMOTIDINE (PF) 20 MG/2 ML INTRAVENOUS SOLUTION
INTRAVENOUS | Status: AC
Start: 2023-02-24 — End: 2023-02-24
  Filled 2023-02-24: qty 2

## 2023-02-24 MED ORDER — FENTANYL (PF) 50 MCG/ML INJECTION SOLUTION
INTRAMUSCULAR | Status: AC
Start: 2023-02-24 — End: 2023-02-24
  Filled 2023-02-24: qty 2

## 2023-02-24 MED ORDER — MIDAZOLAM 5 MG/ML INJECTION WRAPPER
1.5000 mg | Freq: Once | INTRAMUSCULAR | Status: DC | PRN
Start: 2023-02-24 — End: 2023-02-24
  Administered 2023-02-24: 1.5 mg via INTRAVENOUS

## 2023-02-24 MED ORDER — AMOXICILLIN 875 MG-POTASSIUM CLAVULANATE 125 MG TABLET
1.0000 | ORAL_TABLET | Freq: Two times a day (BID) | ORAL | 0 refills | Status: DC
Start: 2023-02-24 — End: 2023-05-11

## 2023-02-24 MED ORDER — HYDROCODONE 5 MG-ACETAMINOPHEN 325 MG TABLET
1.0000 | ORAL_TABLET | ORAL | Status: DC | PRN
Start: 2023-02-24 — End: 2023-02-24
  Administered 2023-02-24: 1 via ORAL
  Filled 2023-02-24: qty 1

## 2023-02-24 MED ORDER — FENTANYL (PF) 50 MCG/ML INJECTION WRAPPER
25.0000 ug | INJECTION | INTRAMUSCULAR | Status: DC | PRN
Start: 2023-02-24 — End: 2023-02-24

## 2023-02-24 MED ORDER — ALBUTEROL SULFATE 2.5 MG/3 ML (0.083 %) SOLUTION FOR NEBULIZATION
2.5000 mg | INHALATION_SOLUTION | Freq: Once | RESPIRATORY_TRACT | Status: DC | PRN
Start: 2023-02-24 — End: 2023-02-24

## 2023-02-24 MED ORDER — ONDANSETRON HCL (PF) 4 MG/2 ML INJECTION SOLUTION
4.0000 mg | Freq: Once | INTRAMUSCULAR | Status: AC
Start: 2023-02-24 — End: 2023-02-24
  Administered 2023-02-24: 4 mg via INTRAVENOUS

## 2023-02-24 MED ORDER — SODIUM CHLORIDE 0.9 % (FLUSH) INJECTION SYRINGE
3.0000 mL | INJECTION | INTRAMUSCULAR | Status: DC | PRN
Start: 2023-02-24 — End: 2023-02-24

## 2023-02-24 MED ORDER — MIDAZOLAM 5 MG/ML INJECTION WRAPPER
Freq: Once | INTRAMUSCULAR | Status: DC | PRN
Start: 2023-02-24 — End: 2023-02-24
  Administered 2023-02-24 (×2): 2.5 mg via INTRAVENOUS

## 2023-02-24 MED ORDER — MORPHINE 2 MG/ML INJECTION WRAPPER
2.0000 mg | INJECTION | INTRAMUSCULAR | Status: DC | PRN
Start: 2023-02-24 — End: 2023-02-24

## 2023-02-24 MED ORDER — IOHEXOL 350 MG IODINE/ML INTRAVENOUS SOLUTION
50.0000 mL | INTRAVENOUS | Status: AC
Start: 2023-02-24 — End: 2023-02-24
  Administered 2023-02-24: 150 mL via INTRAVENOUS

## 2023-02-24 MED ORDER — SIMETHICONE 80 MG CHEWABLE TABLET
40.0000 mg | CHEWABLE_TABLET | Freq: Four times a day (QID) | ORAL | Status: DC | PRN
Start: 2023-02-24 — End: 2023-02-24

## 2023-02-24 MED ORDER — ONDANSETRON HCL (PF) 4 MG/2 ML INJECTION SOLUTION
4.0000 mg | Freq: Four times a day (QID) | INTRAMUSCULAR | Status: DC | PRN
Start: 2023-02-24 — End: 2023-02-24

## 2023-02-24 MED ORDER — SODIUM CHLORIDE 0.9 % (FLUSH) INJECTION SYRINGE
3.0000 mL | INJECTION | Freq: Three times a day (TID) | INTRAMUSCULAR | Status: DC
Start: 2023-02-24 — End: 2023-02-24

## 2023-02-24 MED ORDER — ROPIVACAINE (PF) 2 MG/ML (0.2 %) INJECTION SOLUTION
INTRAMUSCULAR | Status: AC
Start: 2023-02-24 — End: 2023-02-24
  Filled 2023-02-24: qty 20

## 2023-02-24 MED ORDER — LACTATED RINGERS INTRAVENOUS SOLUTION
INTRAVENOUS | Status: DC | PRN
Start: 2023-02-24 — End: 2023-02-24
  Administered 2023-02-24: 0 via INTRAVENOUS

## 2023-02-24 MED ORDER — ONDANSETRON HCL (PF) 4 MG/2 ML INJECTION SOLUTION
4.0000 mg | Freq: Once | INTRAMUSCULAR | Status: DC | PRN
Start: 2023-02-24 — End: 2023-02-24

## 2023-02-24 MED ORDER — PROPOFOL 10 MG/ML IV BOLUS
INJECTION | Freq: Once | INTRAVENOUS | Status: DC | PRN
Start: 2023-02-24 — End: 2023-02-24
  Administered 2023-02-24 (×5): 20 mg via INTRAVENOUS
  Administered 2023-02-24: 10 mg via INTRAVENOUS
  Administered 2023-02-24 (×2): 20 mg via INTRAVENOUS

## 2023-02-24 MED ORDER — ONDANSETRON HCL (PF) 4 MG/2 ML INJECTION SOLUTION
INTRAMUSCULAR | Status: AC
Start: 2023-02-24 — End: 2023-02-24
  Filled 2023-02-24: qty 2

## 2023-02-24 MED ORDER — IPRATROPIUM 0.5 MG-ALBUTEROL 3 MG (2.5 MG BASE)/3 ML NEBULIZATION SOLN
3.0000 mL | INHALATION_SOLUTION | Freq: Once | RESPIRATORY_TRACT | Status: DC | PRN
Start: 2023-02-24 — End: 2023-02-24

## 2023-02-24 SURGICAL SUPPLY — 51 items
BANDAGE SFFRM 75X4IN STRCH FNSH EDGE ABS PLSTR RYN GAUZE STRL LF  DISP (WOUND CARE SUPPLY) ×1 IMPLANT
BLADE 10 2 END CBNSTL SURG STRL DISP (SURGICAL CUTTING SUPPLIES) ×1 IMPLANT
BLADE 15 2 END CBNSTL SURG STRL DISP (SURGICAL CUTTING SUPPLIES) ×1 IMPLANT
BLADE SAW 25X9MM OSCILLATE SGTL THK.38MM THK.64MM SM BONE (SURGICAL CUTTING SUPPLIES) ×1 IMPLANT
BLADE SURG CLPR W 37.2MM GP EXIST HNDL GTT IN CHRG .23MM NONST LF  DISP (MED SURG SUPPLIES) IMPLANT
CLEANER ESURG TIP 2X2IN TIP POLISHR CAUT STRL LF (SURGICAL CUTTING SUPPLIES) IMPLANT
CONTAINR CLICKSEAL 4OZ TRANSLUC SCREW CAP STRL BLU SPECI PNEUM TUBE SYS (SPECIMEN COLLECTION SUPPLIES) ×1 IMPLANT
CONV USE 123874 - SYRINGE AMSURE MDCHC 60CC LF  STRL TIP PRTC SM TUBE ADPR IRRG DISC BULB POLYPROP (MED SURG SUPPLIES) IMPLANT
COUNTER 20 CNT BLOCK ADH NEEDLE STRL LF  RD SHARP FOAM 15.75X11.5X14IN DISP (MED SURG SUPPLIES) ×1 IMPLANT
COVER TBL 90X50IN STD SMS REINF FNFLD STRL LF  DISP (DRAPE/PACKS/SHEETS/OR TOWEL) ×2 IMPLANT
CUFF TOURNIQUET PURP 34X4IN COLOR CUF CYL 2 PORT 1 BLADDER QC 40IN STRL LF  DISP (MED SURG SUPPLIES) IMPLANT
CUFF TOURNIQUET RD 18X4IN COLOR CUF CYL 2 PORT BLADDER QC LOW PROF STRL LF  DISP (MED SURG SUPPLIES) IMPLANT
CUFF TOURNIQUET RYL BLU 30X4IN COLOR CUF CYL 2 PORT 1 BLADDER QC LOW PROF 40IN STRL LF  DISP (MED SURG SUPPLIES) IMPLANT
DETERGENT INSTR 22OZ TRNSPT GEL RINSE FREE NEUT PH PREKLENZ CLR PLSNT LF (MISCELLANEOUS PT CARE ITEMS) ×1 IMPLANT
DRAPE ABS REINF ELAS FEN CNTCT CLSR 146X110X75IN HAND PRXM LF  STRL DISP SURG SMS 42X18IN (DRAPE/PACKS/SHEETS/OR TOWEL) ×1 IMPLANT
DRAPE FNFLD ABS REINF 77X53IN 43528 PRXM LF  STRL DISP SURG SMS 44X23IN (DRAPE/PACKS/SHEETS/OR TOWEL) IMPLANT
ELECTRODE ESURG BLADE PNCL 15FT VLAB EDGE TELESCP SMOKE EVAC (SURGICAL CUTTING SUPPLIES) ×1 IMPLANT
GLOVE SURG 6 LF  PF BEAD CUF STRL CRM 11.3IN PROTEXIS PLISPRN THK9.1 MIL (GLOVES AND ACCESSORIES) IMPLANT
GLOVE SURG 6 LF  PF SMOOTH BEAD CUF INTLK STRL BLU 11.3IN PROTEXIS NEU-THERA PLISPRN THK7.9 MIL (GLOVES AND ACCESSORIES) IMPLANT
GLOVE SURG 6.5 LF  PF BEAD CUF STRL CRM 11.3IN PROTEXIS PI PLISPRN THK9.1 MIL (GLOVES AND ACCESSORIES) ×1 IMPLANT
GLOVE SURG 6.5 LF  PF SMOOTH BEAD CUF INTLK STRL BLU 11.3IN PROTEXIS NEU-THERA PLISPRN THK7.9 MIL (GLOVES AND ACCESSORIES) ×1 IMPLANT
GLOVE SURG 6.5 LTX PF SMOOTH BEAD CUF STRL YW 11.5IN PROTEXIS NEU-THERA DDRGL THK8.7 MIL (GLOVES AND ACCESSORIES) IMPLANT
GLOVE SURG 7 LF  PF BEAD CUF STRL CRM 11.8IN PROTEXIS PI PLISPRN THK9.1 MIL (GLOVES AND ACCESSORIES) IMPLANT
GLOVE SURG 7 LF  PF SMOOTH BEAD CUF INTLK STRL BLU 11.8IN PROTEXIS NEU-THERA PLISPRN THK7.9 MIL (GLOVES AND ACCESSORIES) IMPLANT
GLOVE SURG 7 LTX PF SMOOTH BEAD CUF STRL YW 12IN PROTEXIS NEU-THERA DDRGL THK8.7 MIL (GLOVES AND ACCESSORIES) IMPLANT
GLOVE SURG 7.5 LF  PF BEAD CUF STRL CRM 11.8IN PROTEXIS PI PLISPRN THK9.1 MIL (GLOVES AND ACCESSORIES) ×1 IMPLANT
GLOVE SURG 7.5 LF  PF SMOOTH BEAD CUF INTLK STRL BLU 11.8IN PROTEXIS NEU-THERA PLISPRN THK7.9 MIL (GLOVES AND ACCESSORIES) IMPLANT
GLOVE SURG 7.5 LTX PF SMOOTH BEAD CUF STRL YW 12IN PROTEXIS (GLOVES AND ACCESSORIES) IMPLANT
GLOVE SURG 8 LF  PF BEAD CUF STRL CRM 11.8IN PROTEXIS PI PLISPRN THK9.1 MIL (GLOVES AND ACCESSORIES) IMPLANT
GLOVE SURG 8 LF  PF SMOOTH BEAD CUF INTLK STRL BLU 11.8IN PROTEXIS NEU-THERA PLISPRN THK7.9 MIL (GLOVES AND ACCESSORIES) IMPLANT
GLOVE SURG 8 LTX PF SMOOTH BEAD CUF STRL YW 12IN PROTEXIS NEU-THERA DDRGL THK8.7 MIL (GLOVES AND ACCESSORIES) IMPLANT
GLOVE SURG 8.5 LF  PF BEAD CUF STRL CRM 11.8IN PROTEXIS PI PLISPRN THK9.1 MIL (GLOVES AND ACCESSORIES) IMPLANT
GOWN SURG LRG STD LGTH L3 HKLP CLSR RGLN SLEEVE TWL STRL LF  DISP GRN AERO BLU PRFRM FBRC (DRAPE/PACKS/SHEETS/OR TOWEL) ×1 IMPLANT
GOWN SURG XL STD LGTH L3 HKLP CLSR RGLN SLEEVE TWL STRL LF  DISP GRN AERO BLU PRFRM FBRC (DRAPE/PACKS/SHEETS/OR TOWEL) ×1 IMPLANT
LABEL MED CORRECT MED LABELING SYS 4 FLG 2 SHEET 24 PRPRNT STRL (MED SURG SUPPLIES) ×1 IMPLANT
MAT INSTR TRY 44X36IN WTPRF BACKSHEET TPNX BLU (MISCELLANEOUS PT CARE ITEMS) ×1 IMPLANT
SLEEVE COMPRESS LRG KNEE LGTH KENDALL SCD NONST LF  DISP 26- IN (MED SURG SUPPLIES) IMPLANT
SLEEVE COMPRESS MED KNEE LGTH KENDALL SCD SEQ NONST LF  DISP 21- IN DVT PE (MED SURG SUPPLIES) IMPLANT
SOL IRRG 0.9% NACL 1000ML PLASTIC PR BTL ISTNC N-PYRG STRL LF (MEDICATIONS/SOLUTIONS) ×1 IMPLANT
SPONGE GAUZE 4X4IN MDCHC COTTON 12 PLY TY 7 LF  STRL DISP (WOUND CARE SUPPLY) ×1 IMPLANT
SPONGE SURG 4X4IN 16 PLY XRY DETECT COTTON STRL LF  DISP (WOUND CARE SUPPLY) IMPLANT
STKNT ORTHO 48X4IN COTTON PLSTR 1 PLY PCUT SEWN END LF  TUB STRL OFF WHT (ORTHOPEDICS (NOT IMPLANTS)) IMPLANT
SUTURE 2-0 CT2 VICRYL 27IN VIOL BRD COAT ABS (SUTURE/WOUND CLOSURE) IMPLANT
SUTURE 2-0 X-1 VICRYL 27IN UNDYED BRD COAT ABS (SUTURE/WOUND CLOSURE) IMPLANT
SUTURE 3-0 SH VICRYL 27IN VIOL BRD COAT ABS (SUTURE/WOUND CLOSURE) IMPLANT
SUTURE 4-0 PC-11 SURGIPRO2 18IN BLU MONOF NONAB (SUTURE/WOUND CLOSURE) ×2 IMPLANT
SYRINGE AMSURE MDCHC 60CC LF  STRL TIP PRTC SM TUBE ADPR IRRG DISC BULB POLYPROP (MED SURG SUPPLIES)
TOWEL 24X16IN COTTON BLU DISP SURG STRL LF (DRAPE/PACKS/SHEETS/OR TOWEL) ×2 IMPLANT
TUBING SUCT CLR 6FT .25IN ARGYLE PVC NCDTV STR MALE FEMALE MLD CONN STRL LF (MED SURG SUPPLIES) ×1 IMPLANT
WIPE PREP PLMR ADH NONSTING DRESS ADAPT OSTOMY (MED SURG SUPPLIES) IMPLANT
WOUND IRRG IRRISEPT DBRD CLNSG 0.05% CHG SYSTEM STRL LF (WOUND CARE SUPPLY) ×1 IMPLANT

## 2023-02-24 NOTE — Interval H&P Note (Signed)
[facility]      H&P UPDATE FORM                                                                                  Jeremy Sexton, Jeremy Sexton, 67 y.o. male  Date of Admission:  02/24/2023  Date of Birth:  20-May-1956    02/24/2023    STOP: IF H&P IS GREATER THAN 30 DAYS FROM SURGICAL DAY COMPLETE NEW H&P IS REQUIRED.     H & P updated the day of the procedure.  1.  H&P completed within 30 days of surgical procedure and has been reviewed within 24 hours of admission but prior to surgery or a procedure requiring anesthesia services, the patient has been examined, and no change has occured in the patients condition since the H&P was completed.       Change in medications: No              Comments:     2.  Patient continues to be appropriate candidate for planned surgical procedure. YES    Esmond Plants, MD

## 2023-02-24 NOTE — Anesthesia Transfer of Care (Signed)
ANESTHESIA TRANSFER OF CARE   Jeremy Sexton is a 67 y.o. ,male, Weight: 81.6 kg (180 lb)   had Procedure(s):  RIGHT SECOND AND THIRD TOE AMPUTATION  performed  02/24/23   Primary Service: Esmond Plants, MD    Past Medical History:   Diagnosis Date   . Diabetes mellitus, type 2 (CMS HCC)    . High cholesterol    . HTN (hypertension)       Allergy History as of 02/24/23       TETRACYCLINE         Noted Status Severity Type Reaction    11/17/22 1148 Young, [place], RN 01/14/17 Active                 TETRACYCLINES         Noted Status Severity Type Reaction    01/20/23 [address], RN 01/20/23 Active Medium  Rash                  I completed my transfer of care / handoff to the receiving personnel during which we discussed:  Access, Airway, All key/critical aspects of case discussed, Analgesia, Antibiotics, Expectation of post procedure, Fluids/Product, Gave opportunity for questions and acknowledgement of understanding, Labs and PMHx      Post Location: PACU                                                           Last OR Temp: Temperature: 36.1 C (97 F)  ABG:  POTASSIUM   Date Value Ref Range Status   02/23/2023 3.8 3.5 - 5.1 mmol/L Final     KAPPA FREE LIGHT CHAINS   Date Value Ref Range Status   01/15/2023 1.82 1.25 - 3.25 mg/dL Final   54/03/8118 1.47 1.25 - 3.25 mg/dL Final     KAPPA/LAMBDA FLC RATIO   Date Value Ref Range Status   01/15/2023 1.17 0.80 - 2.10 Final   01/15/2023 1.17 0.80 - 2.10 Final     CALCIUM   Date Value Ref Range Status   02/23/2023 9.4 8.6 - 10.3 mg/dL Final     Calculated P Axis   Date Value Ref Range Status   01/20/2023 0 degrees Final     Calculated R Axis   Date Value Ref Range Status   01/20/2023 28 degrees Final     Calculated T Axis   Date Value Ref Range Status   01/20/2023 25 degrees Final     Airway:* No LDAs found *  Blood pressure 120/60, pulse 69, temperature 36.1 C (97 F), resp. rate 17, height 1.829 m (6'), weight 81.6 kg (180 lb), SpO2 100%.

## 2023-02-24 NOTE — Anesthesia Postprocedure Evaluation (Signed)
Anesthesia Post Op Evaluation    Patient: Jeremy Sexton  Procedure(s):  RIGHT SECOND AND THIRD TOE AMPUTATION    Last Vitals:Temperature: 36.4 C (97.6 F) (02/24/23 1105)  Heart Rate: 75 (02/24/23 1105)  BP (Non-Invasive): 117/61 (02/24/23 1105)  Respiratory Rate: 16 (02/24/23 1105)  SpO2: 98 % (02/24/23 1105)    No notable events documented.    Patient is sufficiently recovered from the effects of anesthesia to participate in the evaluation and has returned to their pre-procedure level.  Patient location during evaluation: PACU       Patient participation: complete - patient participated  Level of consciousness: awake and alert and responsive to verbal stimuli    Pain management: adequate  Airway patency: patent    Anesthetic complications: no  Cardiovascular status: acceptable  Respiratory status: acceptable  Hydration status: acceptable  Patient post-procedure temperature: Pt Normothermic   PONV Status: Absent

## 2023-02-24 NOTE — Anesthesia Preprocedure Evaluation (Signed)
ANESTHESIA PRE-OP EVALUATION  Planned Procedure: RIGHT SECOND AND THIRD TOE AMPUTATION (Right: Toe)  Review of Systems                   Pulmonary     Cardiovascular    Hypertension and hyperlipidemia ,No peripheral edema,        GI/Hepatic/Renal           Endo/Other      type 2 diabetes/ controlled    Neuro/Psych/MS       peripheral neuropathy,  Cancer                        Physical Assessment      Airway       Mallampati: III    TM distance: 3 FB    Neck ROM: full  Mouth Opening: good.  Facial hair  Beard        Dental           (+) edentulous, upper dentures, lower dentures           Pulmonary    Breath sounds clear to auscultation  (-) no rhonchi, no decreased breath sounds, no wheezes, no rales and no stridor     Cardiovascular    Rhythm: regular  Rate: Normal  (-) no friction rub, carotid bruit is not present, no peripheral edema and no murmur     Other findings              Plan  ASA 3     Planned anesthesia type: MAC

## 2023-02-24 NOTE — OR Surgeon (Signed)
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  Given appropriate intravenous sedation.  Right foot prepped with Betadine.  The 2nd toe was addressed 1st.  There was an exposed joint space on the plantar surface and there was no specific manner which that digit was going to be preserved.  Elliptically excise the digit at the level of the skin and it was subsequently removed without any difficulty due to the complete disruption of the metatarsal phalangeal joint space.    The head of the 2nd metatarsal was grossly necrotic.  Dissected back to the mid shaft of the metatarsal and divided it using micro oscillating saw.    Attention was then turned to the 3rd digit.  Unfortunately the 3rd metatarsal was also grossly necrotic.  Transected it in the similar location using micro oscillating saw and removed the 3rd digit as well.  At this point there was brisk bleeding from the skin edges and the residual subcutaneous tissue.  There was no other necrotic tissue remaining.    He is going to have a CT angiogram later today to make sure that there was no reconstructible lesion although we already have a normal noninvasive study.    Wound was dressed appropriately tolerated without difficulty.    Maura Crandall MD MBA CPE FACS

## 2023-02-25 ENCOUNTER — Telehealth (INDEPENDENT_AMBULATORY_CARE_PROVIDER_SITE_OTHER): Payer: Self-pay | Admitting: Surgery

## 2023-03-05 DIAGNOSIS — E1169 Type 2 diabetes mellitus with other specified complication: Secondary | ICD-10-CM

## 2023-03-05 DIAGNOSIS — L97519 Non-pressure chronic ulcer of other part of right foot with unspecified severity: Secondary | ICD-10-CM

## 2023-03-05 DIAGNOSIS — M869 Osteomyelitis, unspecified: Secondary | ICD-10-CM

## 2023-03-05 DIAGNOSIS — E11621 Type 2 diabetes mellitus with foot ulcer: Secondary | ICD-10-CM

## 2023-03-05 LAB — SURGICAL PATHOLOGY SPECIMEN

## 2023-03-15 NOTE — H&P (Signed)
GENERAL SURGERY, [facility]  [address]  [place] [address]    History and Physical    Name: Jeremy Sexton MRN:  [id number]   Date: 03/16/2023 DOB:  1956/07/10 (67 y.o.)                  Reason for Visit: Post Op (Post op right 2nd and 3rd toe amputation)    History of Present Illness  Jeremy Sexton presents today for evaluation of his right foot.  Last underwent operative intervention by me on July 30th.  At that time underwent transmetatarsal amputation of the 2nd and 3rd toes with gross osteomyelitis changes associated with that.  Is continuing to do dressing changes on his right foot.  Wet-to-dry with no other complaints.                Patient Data  Patient History  Past Medical History:   Diagnosis Date    Diabetes mellitus, type 2 (CMS HCC)     High cholesterol     HTN (hypertension)          Past Surgical History:   Procedure Laterality Date    HX BACK SURGERY      HX TONSILLECTOMY      TOE AMPUTATION Right     great toe         Current Outpatient Medications   Medication Sig    acetaminophen (TYLENOL) 500 mg Oral Tablet Take 1 Tablet (500 mg total) by mouth Every 4 hours as needed for Pain    amoxicillin-pot clavulanate (AUGMENTIN) 875-125 mg Oral Tablet Take 1 Tablet by mouth Twice daily    atenoloL (TENORMIN) 50 mg Oral Tablet Take 1 Tablet (50 mg total) by mouth Once a day    atorvastatin (LIPITOR) 20 mg Oral Tablet Take 1 Tablet (20 mg total) by mouth Every evening    cloNIDine HCL (CATAPRES) 0.1 mg Oral Tablet Take 1 Tablet (0.1 mg total) by mouth Twice daily    furosemide (LASIX) 20 mg Oral Tablet Take 1 Tablet (20 mg total) by mouth Once a day    gabapentin (NEURONTIN) 400 mg Oral Capsule Take 1 Capsule (400 mg total) by mouth Three times a day    lisinopriL (PRINIVIL) 10 mg Oral Tablet Take 1 Tablet (10 mg total) by mouth Once a day    metFORMIN (GLUCOPHAGE) 500 mg Oral Tablet Take 1 Tablet (500 mg total) by mouth Twice daily with food    omeprazole (PRILOSEC) 40  mg Oral Capsule, Delayed Release(E.C.) Take 1 Capsule (40 mg total) by mouth Once a day    spironolactone (ALDACTONE) 100 mg Oral Tablet Take 1 Tablet (100 mg total) by mouth Every morning with breakfast    traZODone (DESYREL) 50 mg Oral Tablet Take 1 Tablet (50 mg total) by mouth Every night     Allergies   Allergen Reactions    Tetracyclines Rash    Tetracycline      Family Medical History:    None         Social History     Tobacco Use    Smoking status: Never     Passive exposure: Never    Smokeless tobacco: Never   Vaping Use    Vaping status: Never Used   Substance Use Topics    Alcohol use: Never     Comment: Previous, quit 1 year ago    Drug use: Never  Physical Examination:  Vitals:    03/16/23 1057   BP: (!) 163/85   Pulse: 60   Resp: 18   Temp: 36.6 C (97.9 F)   SpO2: 99%   Weight: 88 kg (194 lb)   Height: 1.803 m (5\' 11" )   BMI: 27.11      General: appropriate for age. in no acute distress.    Vital signs are present above and have been reviewed by me         Right foot with the 1st time that appears to have a favorable progression.  Granulation tissue with minimal devitalized tissue along the plantar surface of the open wound.  Some induration of the skin edges but appeared to be related to the previous process and not acute erythema.  No expressible drainage.  No unfavorable changes to the remaining to toes.            Assessment and Plan    ICD-10-CM    1. 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  Continue dressing changes.  His CT angio did not show any significant area of arterial obstruction that needs intervention at this time.  He has no other complaints.  Recheck 2-3 weeks.          I appreciate the opportunity to be involved in the care of your patients.  If you have any questions or  concerns regarding this encounter, please do not hesitate to contact me at your convenience.      Maura Crandall MD MBA CPE FACS     This note may have been partially generated using MModal Fluency Direct system, and there may be some incorrect words, spellings, and punctuation that were not noted in checking the note before saving, though effort was made to avoid such errors.

## 2023-03-16 ENCOUNTER — Other Ambulatory Visit: Payer: Self-pay

## 2023-03-16 ENCOUNTER — Encounter (INDEPENDENT_AMBULATORY_CARE_PROVIDER_SITE_OTHER): Payer: Self-pay | Admitting: Surgery

## 2023-03-16 ENCOUNTER — Ambulatory Visit (INDEPENDENT_AMBULATORY_CARE_PROVIDER_SITE_OTHER): Payer: MEDICARE | Admitting: Surgery

## 2023-03-16 VITALS — BP 163/85 | HR 60 | Temp 97.9°F | Resp 18 | Ht 71.0 in | Wt 194.0 lb

## 2023-03-16 DIAGNOSIS — Z9889 Other specified postprocedural states: Secondary | ICD-10-CM

## 2023-03-16 DIAGNOSIS — L97514 Non-pressure chronic ulcer of other part of right foot with necrosis of bone: Secondary | ICD-10-CM

## 2023-03-16 DIAGNOSIS — E08621 Diabetes mellitus due to underlying condition with foot ulcer: Secondary | ICD-10-CM

## 2023-04-04 NOTE — Progress Notes (Signed)
GENERAL SURGERY, [facility]  [address]  [place] [address]    Progress Note    Name: BASHIR KUSNER MRN:  [id number]   Date: 04/06/2023 DOB:  1956/05/05 (67 y.o.)              Date of Birth:  19-Mar-1956  PCP: Mliss Sax, DO  Referring:  No ref. provider found     HPI:  Jeremy Sexton is a 67 y.o. White male who returns for repeat evaluation of his right foot necrotizing soft tissue infection.  Has resulted in 3 digital amputation so far.  Continuing to undergo wet-to-dry dressing changes.      Recent bone scan showed no evidence of osteomyelitis of the 5th metatarsal which was suspected.  The family remains pleased about the medial portion but is concerned about the lateral ulcer        Past Medical History:   Diagnosis Date    Diabetes mellitus, type 2 (CMS HCC)     High cholesterol     HTN (hypertension)       Past Surgical History:   Procedure Laterality Date    HX BACK SURGERY      HX TONSILLECTOMY      TOE AMPUTATION Right     great toe    TOE AMPUTATION Right     2nd and 3rd digit      No outpatient medications have been marked as taking for the 04/06/23 encounter (Office Visit) with Esmond Plants, MD.      Allergies   Allergen Reactions    Tetracyclines Rash    Tetracycline            BP (!) 177/71   Pulse 68   Temp (!) 35.9 C (96.7 F)   Wt 92.1 kg (203 lb)   SpO2 98%   BMI 28.31 kg/m          General: appropriate for age. in no acute distress.    Vital signs are present above and have been reviewed by me     HEENT: Atraumatic, Normocephalic.    Lungs: Nonlabored breathing with symmetric expansion    Heart:Regular wth respect to rate.    Abdomen:Soft. Nontender. Nondistended     Right foot medial aspect is almost healed at this point.  There is a slightly over a cm superficial ulceration remaining.  The lateral aspect shows a necrotic ulceration overlying the lateral border of the head of the 5th metatarsal.  There was no exposed bone exactly but it appears to be still  intimately involved in the base of the wound.    Psychiatric: Alert and oriented to person, place, and time. affect appropriate       Assessment/Plan:  Assessment/Plan   1. 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  Should be able to salvage of the digit.  Risks and benefits of this discussed with patient and son.  All questions answered to their satisfaction.      This note was partially created using voice recognition software and is inherently subject to errors including those of syntax and "sound alike " substitutions which may escape proof reading. In such instances, original meaning may be extrapolated by contextual derivation.    Maura Crandall MD MBA CPE FACS

## 2023-04-06 ENCOUNTER — Encounter (INDEPENDENT_AMBULATORY_CARE_PROVIDER_SITE_OTHER): Payer: Self-pay | Admitting: Surgery

## 2023-04-06 ENCOUNTER — Ambulatory Visit (INDEPENDENT_AMBULATORY_CARE_PROVIDER_SITE_OTHER): Payer: MEDICARE | Admitting: Surgery

## 2023-04-06 ENCOUNTER — Other Ambulatory Visit: Payer: Self-pay

## 2023-04-06 VITALS — BP 177/71 | HR 68 | Temp 96.7°F | Wt 203.0 lb

## 2023-04-06 DIAGNOSIS — Z9889 Other specified postprocedural states: Secondary | ICD-10-CM

## 2023-04-06 DIAGNOSIS — E08621 Diabetes mellitus due to underlying condition with foot ulcer: Secondary | ICD-10-CM

## 2023-04-06 DIAGNOSIS — L97514 Non-pressure chronic ulcer of other part of right foot with necrosis of bone: Secondary | ICD-10-CM

## 2023-04-06 NOTE — Nursing Note (Signed)
Wet to dry dressing applied to right foot toe amputation site. Tolerated well. Ma Rings, LPN  03/01/1323 40:10

## 2023-04-11 ENCOUNTER — Encounter (INDEPENDENT_AMBULATORY_CARE_PROVIDER_SITE_OTHER): Payer: Self-pay | Admitting: Surgery

## 2023-04-20 ENCOUNTER — Encounter (INDEPENDENT_AMBULATORY_CARE_PROVIDER_SITE_OTHER): Payer: Self-pay | Admitting: Surgery

## 2023-04-20 ENCOUNTER — Other Ambulatory Visit: Payer: Self-pay

## 2023-04-20 ENCOUNTER — Ambulatory Visit (INDEPENDENT_AMBULATORY_CARE_PROVIDER_SITE_OTHER): Payer: MEDICARE | Admitting: Surgery

## 2023-04-20 VITALS — BP 164/76 | HR 65 | Temp 97.9°F | Ht 71.0 in | Wt 203.0 lb

## 2023-04-20 DIAGNOSIS — Z9889 Other specified postprocedural states: Secondary | ICD-10-CM

## 2023-04-20 DIAGNOSIS — M7989 Other specified soft tissue disorders: Secondary | ICD-10-CM

## 2023-04-20 DIAGNOSIS — E11621 Type 2 diabetes mellitus with foot ulcer: Secondary | ICD-10-CM

## 2023-04-20 NOTE — Nursing Note (Signed)
Wet to dry dressing applied. Ma Rings, LPN  0/98/1191 47:82

## 2023-04-23 ENCOUNTER — Encounter (INDEPENDENT_AMBULATORY_CARE_PROVIDER_SITE_OTHER): Payer: Self-pay | Admitting: Surgery

## 2023-05-05 NOTE — Progress Notes (Deleted)
GENERAL SURGERY, [facility]  [address]  [place] [address]    Progress Note    Name: Jeremy Sexton MRN:  [id number]   Date: 05/06/2023 DOB:  12-09-1955 (67 y.o.)              Date of Birth:  12/12/1955  PCP: Mliss Sax, DO  Referring:  No ref. provider found     HPI:  Jeremy Sexton is a 67 y.o. White male who returns for scheduled evaluation of his right diabetic foot ulceration.  Last debridement performed in July.  Currently undergoing wet-to-dry dressing changes under the guidance of home health.          Past Medical History:   Diagnosis Date    Diabetes mellitus, type 2 (CMS HCC)     High cholesterol     HTN (hypertension)       Past Surgical History:   Procedure Laterality Date    HX BACK SURGERY      HX TONSILLECTOMY      TOE AMPUTATION Right     great toe    TOE AMPUTATION Right     2nd and 3rd digit      No outpatient medications have been marked as taking for the 05/06/23 encounter (Appointment) with Esmond Plants, MD.      Allergies   Allergen Reactions    Tetracyclines Rash    Tetracycline            There were no vitals taken for this visit.         General: appropriate for age. in no acute distress.    Vital signs are present above and have been reviewed by me     HEENT: Atraumatic, Normocephalic.    Lungs: Nonlabored breathing with symmetric expansion    Heart:Regular wth respect to rate.    Abdomen:Soft. Nontender. Nondistended     Right foot    Psychiatric: Alert and oriented to person, place, and time. affect appropriate       Assessment/Plan:  No diagnosis found.     ***      This note was partially created using voice recognition software and is inherently subject to errors including those of syntax and "sound alike " substitutions which may escape proof reading. In such instances, original meaning may be extrapolated by contextual derivation.    Maura Crandall MD MBA CPE FACS

## 2023-05-06 ENCOUNTER — Encounter (INDEPENDENT_AMBULATORY_CARE_PROVIDER_SITE_OTHER): Payer: Self-pay | Admitting: Surgery

## 2023-05-06 ENCOUNTER — Other Ambulatory Visit: Payer: Self-pay

## 2023-05-06 ENCOUNTER — Other Ambulatory Visit (INDEPENDENT_AMBULATORY_CARE_PROVIDER_SITE_OTHER): Payer: Self-pay | Admitting: Surgery

## 2023-05-06 ENCOUNTER — Ambulatory Visit (INDEPENDENT_AMBULATORY_CARE_PROVIDER_SITE_OTHER): Payer: MEDICARE | Admitting: Surgery

## 2023-05-06 VITALS — BP 180/80 | HR 73 | Temp 97.7°F | Resp 18 | Ht 72.0 in | Wt 207.0 lb

## 2023-05-06 DIAGNOSIS — E08621 Diabetes mellitus due to underlying condition with foot ulcer: Secondary | ICD-10-CM

## 2023-05-06 DIAGNOSIS — L97519 Non-pressure chronic ulcer of other part of right foot with unspecified severity: Secondary | ICD-10-CM

## 2023-05-06 DIAGNOSIS — Z9889 Other specified postprocedural states: Secondary | ICD-10-CM

## 2023-05-06 DIAGNOSIS — Z01818 Encounter for other preprocedural examination: Secondary | ICD-10-CM

## 2023-05-07 ENCOUNTER — Encounter (INDEPENDENT_AMBULATORY_CARE_PROVIDER_SITE_OTHER): Payer: Self-pay | Admitting: Surgery

## 2023-05-07 ENCOUNTER — Telehealth (INDEPENDENT_AMBULATORY_CARE_PROVIDER_SITE_OTHER): Payer: Self-pay | Admitting: Surgery

## 2023-05-07 DIAGNOSIS — L97519 Non-pressure chronic ulcer of other part of right foot with unspecified severity: Secondary | ICD-10-CM

## 2023-05-07 DIAGNOSIS — M7989 Other specified soft tissue disorders: Secondary | ICD-10-CM

## 2023-05-07 NOTE — H&P (View-Only) (Signed)
GENERAL SURGERY, [facility]  [address]  [place] [address]    History and Physical    Name: Jeremy Sexton MRN:  [id number]   Date: 05/06/2023 DOB:  15-Aug-1955 (67 y.o.)                  Reason for Visit: Post Op (Amputation right 2nd and 3rd digit)    History of Present Illness  Mr. Dolney presents today for scheduled evaluation of his right diabetic foot ulceration. Last debridement performed in July. Currently undergoing wet-to-dry dressing changes under the guidance of home health.  He has no specific new complaints.  Still moderate amount of serous drainage from this.  No fever.            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         Current Outpatient Medications   Medication Sig    acetaminophen (TYLENOL) 500 mg Oral Tablet Take 1 Tablet (500 mg total) by mouth Every 4 hours as needed for Pain    amoxicillin-pot clavulanate (AUGMENTIN) 875-125 mg Oral Tablet Take 1 Tablet by mouth Twice daily (Patient not taking: Reported on 04/20/2023)    atenoloL (TENORMIN) 50 mg Oral Tablet Take 1 Tablet (50 mg total) by mouth Once a day    atorvastatin (LIPITOR) 20 mg Oral Tablet Take 1 Tablet (20 mg total) by mouth Every evening    cloNIDine HCL (CATAPRES) 0.1 mg Oral Tablet Take 1 Tablet (0.1 mg total) by mouth Twice daily    furosemide (LASIX) 20 mg Oral Tablet Take 1 Tablet (20 mg total) by mouth Once a day    gabapentin (NEURONTIN) 400 mg Oral Capsule Take 1 Capsule (400 mg total) by mouth Three times a day    lisinopriL (PRINIVIL) 10 mg Oral Tablet Take 1 Tablet (10 mg total) by mouth Once a day    metFORMIN (GLUCOPHAGE) 500 mg Oral Tablet Take 1 Tablet (500 mg total) by mouth Twice  daily with food    omeprazole (PRILOSEC) 40 mg Oral Capsule, Delayed Release(E.C.) Take 1 Capsule (40 mg total) by mouth Once a day    spironolactone (ALDACTONE) 100 mg Oral Tablet Take 1 Tablet (100 mg total) by mouth Every morning with breakfast    traZODone (DESYREL) 50 mg Oral Tablet Take 1 Tablet (50 mg total) by mouth Every night     Allergies   Allergen Reactions    Tetracyclines Rash    Tetracycline      Family Medical History:    None         Social History     Tobacco Use    Smoking status: Never     Passive exposure: Never    Smokeless tobacco: Never   Vaping Use    Vaping status: Never Used   Substance Use Topics    Alcohol use: Never     Comment: Previous, quit 1 year ago    Drug use: Never  Physical Examination:  Vitals:    05/06/23 1007   BP: (!) 180/80   Pulse: 73   Resp: 18   Temp: 36.5 C (97.7 F)   SpO2: 97%   Weight: 93.9 kg (207 lb)   Height: 1.829 m (6')   BMI: 28.13      General: appropriate for age. in no acute distress.    Vital signs are present above and have been reviewed by me     HEENT: Atraumatic, Normocephalic.    Lungs: Nonlabored breathing with symmetric expansion    Heart:Regular wth respect to rate.    Abdomen:Soft. Nontender. Nondistended     Right foot still moderate size defect in the distal aspect of his foot.  There is slow skin ingrowth with reasonable granulation tissue.  Some induration remains of the forefoot surrounding it but no new unfavorable changes.        Psychiatric: Alert and oriented to person, place, and time. affect appropriate      Assessment and Plan    ICD-10-CM    1. Diabetic ulcer of other part of right foot associated with diabetes mellitus due to underlying condition, unspecified ulcer stage (CMS [facility])  F62.130     L97.519             Plan for debridement with Apligraft to try and see if we can improve he timing of his healing.    Also plan for MRI of his foot to make sure there was no underlying internal abnormality that would be precluding  his healing at this point.    All questions answered to his satisfaction.          I appreciate the opportunity to be involved in the care of your patients.  If you have any questions or concerns regarding this encounter, please do not hesitate to contact me at your convenience.      Maura Crandall MD MBA CPE FACS     This note may have been partially generated using MModal Fluency Direct system, and there may be some incorrect words, spellings, and punctuation that were not noted in checking the note before saving, though effort was made to avoid such errors.

## 2023-05-07 NOTE — Telephone Encounter (Addendum)
Order placed, will schedule. Margit Hanks, LPN  16/60/6301 08:24          ----- Message from Esmond Plants sent at 05/07/2023  8:13 AM EDT -----  Patient needs scheduled for an MRI of his right foot

## 2023-05-07 NOTE — H&P (Signed)
GENERAL SURGERY, [facility]  [address]  [place] [address]    History and Physical    Name: Jeremy Sexton MRN:  [id number]   Date: 05/06/2023 DOB:  1956-05-28 (66 y.o.)                  Reason for Visit: Post Op (Amputation right 2nd and 3rd digit)    History of Present Illness  Mr. Seubert presents today for scheduled evaluation of his right diabetic foot ulceration. Last debridement performed in July. Currently undergoing wet-to-dry dressing changes under the guidance of home health.  He has no specific new complaints.  Still moderate amount of serous drainage from this.  No fever.            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         Current Outpatient Medications   Medication Sig    acetaminophen (TYLENOL) 500 mg Oral Tablet Take 1 Tablet (500 mg total) by mouth Every 4 hours as needed for Pain    amoxicillin-pot clavulanate (AUGMENTIN) 875-125 mg Oral Tablet Take 1 Tablet by mouth Twice daily (Patient not taking: Reported on 04/20/2023)    atenoloL (TENORMIN) 50 mg Oral Tablet Take 1 Tablet (50 mg total) by mouth Once a day    atorvastatin (LIPITOR) 20 mg Oral Tablet Take 1 Tablet (20 mg total) by mouth Every evening    cloNIDine HCL (CATAPRES) 0.1 mg Oral Tablet Take 1 Tablet (0.1 mg total) by mouth Twice daily    furosemide (LASIX) 20 mg Oral Tablet Take 1 Tablet (20 mg total) by mouth Once a day    gabapentin (NEURONTIN) 400 mg Oral Capsule Take 1 Capsule (400 mg total) by mouth Three times a day    lisinopriL (PRINIVIL) 10 mg Oral Tablet Take 1 Tablet (10 mg total) by mouth Once a day    metFORMIN (GLUCOPHAGE) 500 mg Oral Tablet Take 1 Tablet (500 mg total) by mouth Twice  daily with food    omeprazole (PRILOSEC) 40 mg Oral Capsule, Delayed Release(E.C.) Take 1 Capsule (40 mg total) by mouth Once a day    spironolactone (ALDACTONE) 100 mg Oral Tablet Take 1 Tablet (100 mg total) by mouth Every morning with breakfast    traZODone (DESYREL) 50 mg Oral Tablet Take 1 Tablet (50 mg total) by mouth Every night     Allergies   Allergen Reactions    Tetracyclines Rash    Tetracycline      Family Medical History:    None         Social History     Tobacco Use    Smoking status: Never     Passive exposure: Never    Smokeless tobacco: Never   Vaping Use    Vaping status: Never Used   Substance Use Topics    Alcohol use: Never     Comment: Previous, quit 1 year ago    Drug use: Never  Physical Examination:  Vitals:    05/06/23 1007   BP: (!) 180/80   Pulse: 73   Resp: 18   Temp: 36.5 C (97.7 F)   SpO2: 97%   Weight: 93.9 kg (207 lb)   Height: 1.829 m (6')   BMI: 28.13      General: appropriate for age. in no acute distress.    Vital signs are present above and have been reviewed by me     HEENT: Atraumatic, Normocephalic.    Lungs: Nonlabored breathing with symmetric expansion    Heart:Regular wth respect to rate.    Abdomen:Soft. Nontender. Nondistended     Right foot still moderate size defect in the distal aspect of his foot.  There is slow skin ingrowth with reasonable granulation tissue.  Some induration remains of the forefoot surrounding it but no new unfavorable changes.        Psychiatric: Alert and oriented to person, place, and time. affect appropriate      Assessment and Plan    ICD-10-CM    1. Diabetic ulcer of other part of right foot associated with diabetes mellitus due to underlying condition, unspecified ulcer stage (CMS [facility])  C16.606     L97.519             Plan for debridement with Apligraft to try and see if we can improve he timing of his healing.    Also plan for MRI of his foot to make sure there was no underlying internal abnormality that would be precluding  his healing at this point.    All questions answered to his satisfaction.          I appreciate the opportunity to be involved in the care of your patients.  If you have any questions or concerns regarding this encounter, please do not hesitate to contact me at your convenience.      Maura Crandall MD MBA CPE FACS     This note may have been partially generated using MModal Fluency Direct system, and there may be some incorrect words, spellings, and punctuation that were not noted in checking the note before saving, though effort was made to avoid such errors.

## 2023-05-08 ENCOUNTER — Inpatient Hospital Stay (HOSPITAL_BASED_OUTPATIENT_CLINIC_OR_DEPARTMENT_OTHER)
Admission: RE | Admit: 2023-05-08 | Discharge: 2023-05-08 | Disposition: A | Discharge status: Home / Self Care | Payer: MEDICARE | Source: Ambulatory Visit | Attending: Surgery

## 2023-05-08 ENCOUNTER — Ambulatory Visit: Payer: MEDICARE | Attending: Surgery

## 2023-05-08 ENCOUNTER — Other Ambulatory Visit: Payer: Self-pay

## 2023-05-08 DIAGNOSIS — R9431 Abnormal electrocardiogram [ECG] [EKG]: Secondary | ICD-10-CM

## 2023-05-08 DIAGNOSIS — L97519 Non-pressure chronic ulcer of other part of right foot with unspecified severity: Secondary | ICD-10-CM | POA: Insufficient documentation

## 2023-05-08 DIAGNOSIS — Z01818 Encounter for other preprocedural examination: Secondary | ICD-10-CM

## 2023-05-08 LAB — CBC WITH DIFF
BASOPHIL #: 0 10*3/uL (ref 0.00–0.10)
BASOPHIL %: 0 % (ref 0–1)
EOSINOPHIL #: 0.3 10*3/uL (ref 0.00–0.50)
EOSINOPHIL %: 3 % (ref 1–8)
HCT: 31.5 % — ABNORMAL LOW (ref 36.7–47.1)
HGB: 10.2 g/dL — ABNORMAL LOW (ref 12.5–16.3)
LYMPHOCYTE #: 1.6 10*3/uL (ref 1.00–3.00)
LYMPHOCYTE %: 18 % (ref 16–44)
MCH: 26.7 pg (ref 23.8–33.4)
MCHC: 32.2 g/dL — ABNORMAL LOW (ref 32.5–36.3)
MCV: 82.9 fL (ref 73.0–96.2)
MONOCYTE #: 0.7 10*3/uL (ref 0.30–1.00)
MONOCYTE %: 8 % (ref 5–13)
MPV: 7.3 fL — ABNORMAL LOW (ref 7.4–11.4)
NEUTROPHIL #: 6.1 10*3/uL (ref 1.85–7.80)
NEUTROPHIL %: 70 % (ref 43–77)
PLATELETS: 339 10*3/uL (ref 140–440)
RBC: 3.8 10*6/uL — ABNORMAL LOW (ref 4.06–5.63)
RDW: 17.4 % — ABNORMAL HIGH (ref 12.1–16.2)
WBC: 8.7 10*3/uL (ref 3.6–10.2)

## 2023-05-08 LAB — BASIC METABOLIC PANEL
ANION GAP: 7 mmol/L (ref 4–13)
BUN/CREA RATIO: 20 (ref 6–22)
BUN: 12 mg/dL (ref 7–25)
CALCIUM: 9.6 mg/dL (ref 8.6–10.3)
CHLORIDE: 108 mmol/L — ABNORMAL HIGH (ref 98–107)
CO2 TOTAL: 24 mmol/L (ref 21–31)
CREATININE: 0.61 mg/dL (ref 0.60–1.30)
ESTIMATED GFR: 106 mL/min/{1.73_m2} (ref >59)
GLUCOSE: 109 mg/dL (ref 74–109)
OSMOLALITY, CALCULATED: 278 mosm/kg (ref 270–290)
POTASSIUM: 3.5 mmol/L (ref 3.5–5.1)
SODIUM: 139 mmol/L (ref 136–145)

## 2023-05-08 LAB — ECG 12 LEAD
Atrial Rate: 69 {beats}/min
Calculated P Axis: 70 degrees
Calculated R Axis: 56 degrees
Calculated T Axis: 97 degrees
PR Interval: 188 ms
QRS Duration: 78 ms
QT Interval: 396 ms
QTC Calculation: 424 ms
Ventricular rate: 69 {beats}/min

## 2023-05-10 ENCOUNTER — Ambulatory Visit
Admission: RE | Admit: 2023-05-10 | Discharge: 2023-05-10 | Disposition: A | Discharge status: Home / Self Care | Payer: MEDICARE | Source: Ambulatory Visit | Attending: Surgery | Admitting: Surgery

## 2023-05-10 ENCOUNTER — Other Ambulatory Visit: Payer: Self-pay

## 2023-05-10 DIAGNOSIS — L97519 Non-pressure chronic ulcer of other part of right foot with unspecified severity: Secondary | ICD-10-CM | POA: Insufficient documentation

## 2023-05-10 DIAGNOSIS — M7989 Other specified soft tissue disorders: Secondary | ICD-10-CM | POA: Insufficient documentation

## 2023-05-10 DIAGNOSIS — E08621 Diabetes mellitus due to underlying condition with foot ulcer: Secondary | ICD-10-CM | POA: Insufficient documentation

## 2023-05-10 MED ORDER — GADOBUTROL 10 MMOL/10 ML (1 MMOL/ML) INTRAVENOUS SOLUTION
10.0000 mL | INTRAVENOUS | Status: AC
Start: 2023-05-10 — End: 2023-05-10
  Administered 2023-05-10: 10 mL via INTRAVENOUS

## 2023-05-11 DIAGNOSIS — T8189XA Other complications of procedures, not elsewhere classified, initial encounter: Secondary | ICD-10-CM

## 2023-05-11 DIAGNOSIS — E08621 Diabetes mellitus due to underlying condition with foot ulcer: Secondary | ICD-10-CM

## 2023-05-11 DIAGNOSIS — M7989 Other specified soft tissue disorders: Secondary | ICD-10-CM

## 2023-05-11 DIAGNOSIS — L97519 Non-pressure chronic ulcer of other part of right foot with unspecified severity: Secondary | ICD-10-CM

## 2023-05-12 ENCOUNTER — Ambulatory Visit (HOSPITAL_COMMUNITY): Payer: MEDICARE | Admitting: Anesthesiology

## 2023-05-12 ENCOUNTER — Ambulatory Visit
Admission: RE | Admit: 2023-05-12 | Discharge: 2023-05-12 | Disposition: A | Discharge status: Home / Self Care | Payer: MEDICARE | Source: Ambulatory Visit | Attending: Surgery | Admitting: Surgery

## 2023-05-12 ENCOUNTER — Encounter (HOSPITAL_COMMUNITY): Payer: MEDICARE | Admitting: Surgery

## 2023-05-12 ENCOUNTER — Other Ambulatory Visit: Payer: Self-pay

## 2023-05-12 ENCOUNTER — Encounter (HOSPITAL_COMMUNITY)
Admission: RE | Disposition: A | Discharge status: Home / Self Care | Payer: Self-pay | Source: Ambulatory Visit | Attending: Surgery

## 2023-05-12 ENCOUNTER — Encounter (HOSPITAL_COMMUNITY): Payer: Self-pay | Admitting: Surgery

## 2023-05-12 DIAGNOSIS — K219 Gastro-esophageal reflux disease without esophagitis: Secondary | ICD-10-CM | POA: Insufficient documentation

## 2023-05-12 DIAGNOSIS — E7800 Pure hypercholesterolemia, unspecified: Secondary | ICD-10-CM | POA: Insufficient documentation

## 2023-05-12 DIAGNOSIS — L97519 Non-pressure chronic ulcer of other part of right foot with unspecified severity: Secondary | ICD-10-CM

## 2023-05-12 DIAGNOSIS — I10 Essential (primary) hypertension: Secondary | ICD-10-CM | POA: Insufficient documentation

## 2023-05-12 DIAGNOSIS — M539 Dorsopathy, unspecified: Secondary | ICD-10-CM | POA: Insufficient documentation

## 2023-05-12 DIAGNOSIS — D649 Anemia, unspecified: Secondary | ICD-10-CM | POA: Insufficient documentation

## 2023-05-12 DIAGNOSIS — E11621 Type 2 diabetes mellitus with foot ulcer: Secondary | ICD-10-CM | POA: Insufficient documentation

## 2023-05-12 DIAGNOSIS — Z79899 Other long term (current) drug therapy: Secondary | ICD-10-CM | POA: Insufficient documentation

## 2023-05-12 DIAGNOSIS — Z7984 Long term (current) use of oral hypoglycemic drugs: Secondary | ICD-10-CM | POA: Insufficient documentation

## 2023-05-12 LAB — POC BLOOD GLUCOSE (RESULTS): GLUCOSE, POC: 130 mg/dL — ABNORMAL HIGH (ref 70–100)

## 2023-05-12 SURGERY — DEBRIDEMENT FOOT
Anesthesia: General | Site: Foot | Laterality: Right | Wound class: Dirty or Infected Wounds-Include old traumatic wounds

## 2023-05-12 MED ORDER — MIDAZOLAM 5 MG/ML INJECTION WRAPPER
INTRAMUSCULAR | Status: AC
Start: 2023-05-12 — End: 2023-05-12
  Filled 2023-05-12: qty 1

## 2023-05-12 MED ORDER — SODIUM CHLORIDE 0.9 % (FLUSH) INJECTION SYRINGE
3.0000 mL | INJECTION | INTRAMUSCULAR | Status: DC | PRN
Start: 2023-05-12 — End: 2023-05-12

## 2023-05-12 MED ORDER — FAMOTIDINE (PF) 20 MG/2 ML INTRAVENOUS SOLUTION
INTRAVENOUS | Status: AC
Start: 2023-05-12 — End: 2023-05-12
  Filled 2023-05-12: qty 2

## 2023-05-12 MED ORDER — MIDAZOLAM 5 MG/ML INJECTION WRAPPER
2.0000 mg | Freq: Once | INTRAMUSCULAR | Status: DC | PRN
Start: 2023-05-12 — End: 2023-05-12
  Administered 2023-05-12: 2 mg via INTRAVENOUS

## 2023-05-12 MED ORDER — FENTANYL (PF) 50 MCG/ML INJECTION WRAPPER
25.0000 ug | INJECTION | INTRAMUSCULAR | Status: DC | PRN
Start: 2023-05-12 — End: 2023-05-12

## 2023-05-12 MED ORDER — PROPOFOL 10 MG/ML IV BOLUS
INJECTION | Freq: Once | INTRAVENOUS | Status: DC | PRN
Start: 2023-05-12 — End: 2023-05-12
  Administered 2023-05-12 (×4): 25 mg via INTRAVENOUS

## 2023-05-12 MED ORDER — FENTANYL (PF) 50 MCG/ML INJECTION SOLUTION
INTRAMUSCULAR | Status: AC
Start: 2023-05-12 — End: 2023-05-12
  Filled 2023-05-12: qty 2

## 2023-05-12 MED ORDER — ONDANSETRON HCL (PF) 4 MG/2 ML INJECTION SOLUTION
4.0000 mg | Freq: Once | INTRAMUSCULAR | Status: AC
Start: 2023-05-12 — End: 2023-05-12
  Administered 2023-05-12: 4 mg via INTRAVENOUS

## 2023-05-12 MED ORDER — FENTANYL (PF) 50 MCG/ML INJECTION WRAPPER
INJECTION | Freq: Once | INTRAMUSCULAR | Status: DC | PRN
Start: 2023-05-12 — End: 2023-05-12
  Administered 2023-05-12 (×2): 50 ug via INTRAVENOUS

## 2023-05-12 MED ORDER — MIDAZOLAM 5 MG/ML INJECTION WRAPPER
Freq: Once | INTRAMUSCULAR | Status: DC | PRN
Start: 2023-05-12 — End: 2023-05-12
  Administered 2023-05-12: 2 mg via INTRAVENOUS

## 2023-05-12 MED ORDER — HYDROCODONE 5 MG-ACETAMINOPHEN 325 MG TABLET
1.0000 | ORAL_TABLET | ORAL | Status: DC | PRN
Start: 2023-05-12 — End: 2023-05-12
  Administered 2023-05-12: 1 via ORAL
  Filled 2023-05-12: qty 1

## 2023-05-12 MED ORDER — PROCHLORPERAZINE EDISYLATE 10 MG/2 ML (5 MG/ML) INJECTION SOLUTION
5.0000 mg | Freq: Once | INTRAMUSCULAR | Status: DC | PRN
Start: 2023-05-12 — End: 2023-05-12

## 2023-05-12 MED ORDER — IPRATROPIUM 0.5 MG-ALBUTEROL 3 MG (2.5 MG BASE)/3 ML NEBULIZATION SOLN
3.0000 mL | INHALATION_SOLUTION | Freq: Once | RESPIRATORY_TRACT | Status: DC | PRN
Start: 2023-05-12 — End: 2023-05-12

## 2023-05-12 MED ORDER — ONDANSETRON HCL (PF) 4 MG/2 ML INJECTION SOLUTION
4.0000 mg | Freq: Four times a day (QID) | INTRAMUSCULAR | Status: DC | PRN
Start: 2023-05-12 — End: 2023-05-12

## 2023-05-12 MED ORDER — SODIUM CHLORIDE 0.9 % (FLUSH) INJECTION SYRINGE
3.0000 mL | INJECTION | Freq: Three times a day (TID) | INTRAMUSCULAR | Status: DC
Start: 2023-05-12 — End: 2023-05-12

## 2023-05-12 MED ORDER — ONDANSETRON HCL (PF) 4 MG/2 ML INJECTION SOLUTION
4.0000 mg | Freq: Once | INTRAMUSCULAR | Status: DC | PRN
Start: 2023-05-12 — End: 2023-05-12

## 2023-05-12 MED ORDER — FAMOTIDINE (PF) 20 MG/2 ML INTRAVENOUS SOLUTION
20.0000 mg | Freq: Once | INTRAVENOUS | Status: AC
Start: 2023-05-12 — End: 2023-05-12
  Administered 2023-05-12: 20 mg via INTRAVENOUS

## 2023-05-12 MED ORDER — LACTATED RINGERS INTRAVENOUS SOLUTION
INTRAVENOUS | Status: DC
Start: 2023-05-12 — End: 2023-05-12

## 2023-05-12 MED ORDER — FENTANYL (PF) 50 MCG/ML INJECTION WRAPPER
50.0000 ug | INJECTION | INTRAMUSCULAR | Status: DC | PRN
Start: 2023-05-12 — End: 2023-05-12

## 2023-05-12 MED ORDER — ALBUTEROL SULFATE 2.5 MG/3 ML (0.083 %) SOLUTION FOR NEBULIZATION
2.5000 mg | INHALATION_SOLUTION | Freq: Once | RESPIRATORY_TRACT | Status: DC | PRN
Start: 2023-05-12 — End: 2023-05-12

## 2023-05-12 MED ORDER — ONDANSETRON HCL (PF) 4 MG/2 ML INJECTION SOLUTION
INTRAMUSCULAR | Status: AC
Start: 2023-05-12 — End: 2023-05-12
  Filled 2023-05-12: qty 2

## 2023-05-12 MED ORDER — SIMETHICONE 80 MG CHEWABLE TABLET
40.0000 mg | CHEWABLE_TABLET | Freq: Four times a day (QID) | ORAL | Status: DC | PRN
Start: 2023-05-12 — End: 2023-05-12

## 2023-05-12 MED ORDER — LACTATED RINGERS INTRAVENOUS SOLUTION
INTRAVENOUS | Status: DC
Start: 2023-05-12 — End: 2023-05-12
  Administered 2023-05-12: 0 via INTRAVENOUS

## 2023-05-12 MED ORDER — MORPHINE 2 MG/ML INJECTION WRAPPER
2.0000 mg | INJECTION | INTRAMUSCULAR | Status: DC | PRN
Start: 2023-05-12 — End: 2023-05-12

## 2023-05-12 SURGICAL SUPPLY — 24 items
ADH LIQUID LF  WTPRF VIAL PREP NONSTAIN MASTISOL STYRAX GUM MASTIC ALC MTHY SLCYT STRL CLR NHZR 2/3 (WOUND CARE SUPPLY) ×1 IMPLANT
BANDAGE COFLX 5YDX4IN STRL CHSV SLF ADH FOAM COMPRESS TAN LF (WOUND CARE SUPPLY) ×1 IMPLANT
BLADE 10 2 END CBNSTL SURG STRL DISP (SURGICAL CUTTING SUPPLIES) ×1 IMPLANT
BLADE 11 2 END CBNSTL SURG STRL DISP (SURGICAL CUTTING SUPPLIES) ×1 IMPLANT
CONV USE 65308 - DRAPE FNFLD ABS REINF 77X53IN 43528 PRXM LF  STRL DISP SURG SMS 44X23IN (DRAPE/PACKS/SHEETS/OR TOWEL) ×1
CONV USE ITEM 307794 - DRAPE ABS REINF ELAS FEN CNTCT CLSR 146X110X75IN HAND PRXM LF  STRL DISP SURG SMS 42X18IN (DRAPE/PACKS/SHEETS/OR TOWEL) ×1
CONV USE ITEM 323065 - GLOVE SURG 5.5 LF PF BEAD CUF SMOOTH STRL WHT 12IN (GLOVES AND ACCESSORIES) ×1 IMPLANT
CONV USE ITEM 337687 - DRAPE REINF FNFLD 90X44IN LF  STRL DISP SURG (DRAPE/PACKS/SHEETS/OR TOWEL) ×1
COUNTER 20 CNT BLOCK ADH NEEDLE STRL LF  RD SHARP FOAM 15.75X11.5X14IN DISP (MED SURG SUPPLIES) ×1 IMPLANT
DETERGENT INSTR 22OZ TRNSPT GEL RINSE FREE NEUT PH PREKLENZ CLR PLSNT LF (MISCELLANEOUS PT CARE ITEMS) ×1 IMPLANT
DRAPE ABS REINF ELAS FEN CNTCT CLSR 146X110X75IN HAND PRXM LF  STRL DISP SURG SMS 42X18IN (DRAPE/PACKS/SHEETS/OR TOWEL) ×1 IMPLANT
DRAPE FNFLD ABS REINF 77X53IN 43528 PRXM LF  STRL DISP SURG SMS 44X23IN (DRAPE/PACKS/SHEETS/OR TOWEL) ×1 IMPLANT
DRAPE REINF FNFLD 90X44IN LF  STRL DISP SURG (DRAPE/PACKS/SHEETS/OR TOWEL) ×1 IMPLANT
DRESS PETRO 9X3IN CURAD COTTON GAUZE NADH OCL IMPREGNATE ATLYTC HEAL LF  STRL WHT (WOUND CARE SUPPLY) ×1 IMPLANT
GLOVE SURG 7 LF  PF BEAD CUF STRL CRM 11.8IN PROTEXIS PI PLISPRN THK9.1 MIL (GLOVES AND ACCESSORIES) ×1 IMPLANT
GOWN SURG LRG STD LGTH L3 HKLP CLSR RGLN SLEEVE TWL STRL LF  DISP GRN AERO BLU PRFRM FBRC (DRAPE/PACKS/SHEETS/OR TOWEL) ×1 IMPLANT
GOWN SURG XL STD LGTH L3 HKLP CLSR RGLN SLEEVE TWL STRL LF  DISP GRN AERO BLU PRFRM FBRC (DRAPE/PACKS/SHEETS/OR TOWEL) ×1 IMPLANT
GRAFT SKIN APLIGRAF HUMAN FBRBL BVN CLGN MATRIX RND RGNRT (IMPLANTS GRAFT/TISSUE) ×1 IMPLANT
SOL IRRG 0.9% NACL 1000ML PLASTIC PR BTL ISTNC N-PYRG STRL LF (MEDICATIONS/SOLUTIONS) ×1 IMPLANT
SPONGE GAUZE 4X4IN MDCHC COTTON 12 PLY TY 7 LF  STRL DISP (WOUND CARE SUPPLY) ×1 IMPLANT
SPONGE LAP 18X18IN PREWASH RIGID TRY STRL LF  WHT (MED SURG SUPPLIES) ×1 IMPLANT
STRIP 4X.5IN STRSTRP PLSTR REINF SKNCLS WHT STRL LF (WOUND CARE SUPPLY) ×3 IMPLANT
TOWEL 24X16IN COTTON BLU DISP SURG STRL LF (DRAPE/PACKS/SHEETS/OR TOWEL) ×1 IMPLANT
TUBING SUCT CLR 6FT .25IN ARGYLE PVC NCDTV STR MALE FEMALE MLD CONN STRL LF (MED SURG SUPPLIES) ×1 IMPLANT

## 2023-05-12 NOTE — Anesthesia Postprocedure Evaluation (Signed)
Anesthesia Post Op Evaluation    Patient: Jeremy Sexton  Procedure(s):  DEBRIDEMENT OF RIGHT FOOT WITH APPLICATION OF APLIGRAFT    Last Vitals:Temperature: 37.1 C (98.8 F) (05/12/23 1150)  Heart Rate: 61 (05/12/23 1150)  BP (Non-Invasive): (!) 140/68 (05/12/23 1150)  Respiratory Rate: 15 (05/12/23 1150)  SpO2: 100 % (05/12/23 1150)    No notable events documented.    Patient is sufficiently recovered from the effects of anesthesia to participate in the evaluation and has returned to their pre-procedure level.  Patient location during evaluation: PACU       Patient participation: complete - patient participated  Level of consciousness: awake and alert and responsive to verbal stimuli    Pain management: adequate  Airway patency: patent    Anesthetic complications: no  Cardiovascular status: acceptable  Respiratory status: acceptable  Hydration status: acceptable  Patient post-procedure temperature: Pt Normothermic   PONV Status: Absent

## 2023-05-12 NOTE — Anesthesia Preprocedure Evaluation (Addendum)
ANESTHESIA PRE-OP EVALUATION  Planned Procedure: DEBRIDEMENT OF RIGHT FOOT WITH APPLICATION OF APLIGRAFT (Right: Foot)  Review of Systems     anesthesia history negative     patient summary reviewed  nursing notes reviewed        Pulmonary    no asthma   Cardiovascular    Hypertension, ECG reviewed, ACE / ARB inhibitor use and hyperlipidemia ,No peripheral edema,  Exercise Tolerance: > or = 4 METS   ,beta blocker therapy  ,taken in last 24 hours     GI/Hepatic/Renal    GERD and well controlled        Endo/Other    anemia,   type 2 diabetes/ controlled with oral medications    Neuro/Psych/MS    back abnormality    peripheral neuropathy,  Cancer    negative hematology/oncology ROS,                     Physical Assessment      Airway       Mallampati: III    TM distance: >3 FB    Neck ROM: full  Mouth Opening: good.  Facial hair  Beard        Dental           (+) edentulous           Pulmonary    Breath sounds clear to auscultation  (-) no rhonchi, no decreased breath sounds, no wheezes, no rales and no stridor     Cardiovascular    Rhythm: regular  Rate: Normal  (-) no friction rub, carotid bruit is not present, no peripheral edema and no murmur     Other findings              Plan  ASA 3     Planned anesthesia type: general     general anesthesia with endotracheal tube intubation      PONV Plan:  I plan to administer pharmcologic prophalaxis antiemetics  POV PLAN:   plan for postoperative opioid use            Intravenous induction     Anesthesia issues/risks discussed are: Eye /Visual Loss, Nerve Injuries, PONV, Stroke, Aspiration, Difficult Airway, Cardiac Events/MI, Intraoperative Awareness/ Recall, Blood Loss and Sore Throat.  Anesthetic plan and risks discussed with patient  signed consent obtained          Patient's NPO status is appropriate for Anesthesia.           Plan discussed with CRNA.

## 2023-05-12 NOTE — Anesthesia Transfer of Care (Signed)
ANESTHESIA TRANSFER OF CARE   Jeremy Sexton is a 67 y.o. ,male, Weight: 99.8 kg (220 lb)   had Procedure(s):  DEBRIDEMENT OF RIGHT FOOT WITH APPLICATION OF APLIGRAFT  performed  05/12/23   Primary Service: Esmond Plants, MD    Past Medical History:   Diagnosis Date   . Diabetes mellitus, type 2 (CMS HCC)    . High cholesterol    . HTN (hypertension)       Allergy History as of 05/12/23       TETRACYCLINE         Noted Status Severity Type Reaction    11/17/22 1148 Young, [place], RN 01/14/17 Active                 TETRACYCLINES         Noted Status Severity Type Reaction    01/20/23 [address], RN 01/20/23 Active Medium  Rash                  I completed my transfer of care / handoff to the receiving personnel during which we discussed:  Access, Airway, All key/critical aspects of case discussed, Analgesia, Antibiotics, Expectation of post procedure, Fluids/Product, Gave opportunity for questions and acknowledgement of understanding, Labs and PMHx      Post Location: PACU                                                           Last OR Temp: Temperature: 37.1 C (98.8 F)  ABG:  POTASSIUM   Date Value Ref Range Status   05/08/2023 3.5 3.5 - 5.1 mmol/L Final     KAPPA FREE LIGHT CHAINS   Date Value Ref Range Status   01/15/2023 1.82 1.25 - 3.25 mg/dL Final   40/04/2724 3.66 1.25 - 3.25 mg/dL Final     KAPPA/LAMBDA FLC RATIO   Date Value Ref Range Status   01/15/2023 1.17 0.80 - 2.10 Final   01/15/2023 1.17 0.80 - 2.10 Final     CALCIUM   Date Value Ref Range Status   05/08/2023 9.6 8.6 - 10.3 mg/dL Final     Calculated P Axis   Date Value Ref Range Status   05/08/2023 70 degrees Final     Calculated R Axis   Date Value Ref Range Status   05/08/2023 56 degrees Final     Calculated T Axis   Date Value Ref Range Status   05/08/2023 97 degrees Final     Airway:* No LDAs found *  Blood pressure (!) 140/68, pulse 61, temperature 37.1 C (98.8 F), resp. rate 15, height 1.829 m (6'), weight 99.8 kg (220 lb), SpO2  100%.

## 2023-05-12 NOTE — OR Surgeon (Signed)
[facility]      Patient Name: Kanen, Mottola Number: [id number]  Date of Service: 05/12/2023   Date of Birth: 11-14-1955      Pre-Operative Diagnosis: RIGHT FOOT ULCER     Post-Operative Diagnosis: RIGHT FOOT ULCER    Procedure(s)/Description:  DEBRIDEMENT OF RIGHT FOOT WITH APPLICATION OF APLIGRAFT and Roland Rack boot    Attending Surgeon: Esmond Plants, MD     Anesthesia:  Anesthesiologist: Cornett, Mayme Genta, MD  CRNA: Albertine Patricia, CRNA    Anesthesia Type: .Monitor Anesthesia Care     Estimated Blood Loss:  Minimal    Patient was taken to the operating room.  Given appropriate intravenous sedation.  Right foot prepped and draped in usual sterile fashion.  Debrided with a curette removing the superficial areas of granulation tissue over the open wound.  The size of the wound itself was approximally 8 x 4 cm.    Apligraf was obtained.  It was prepped on the back table and subsequently transferred to the wound bed.  It was affixed circumferentially around the wound bed using Steri-Strips.    Apligraf was dressed appropriately and then subsequently the foot , ankle and calf were covered with compression wrap (Unna boot) and Coban.  Tolerated without difficulty.    Maura Crandall MD MBA CPE FACS

## 2023-05-12 NOTE — Interval H&P Note (Signed)
[facility]      H&P UPDATE FORM                                                                                  Jeremy Sexton, Jeremy Sexton, 67 y.o. male  Date of Admission:  05/12/2023  Date of Birth:  05/29/1956    05/12/2023    STOP: IF H&P IS GREATER THAN 30 DAYS FROM SURGICAL DAY COMPLETE NEW H&P IS REQUIRED.     H & P updated the day of the procedure.  1.  H&P completed within 30 days of surgical procedure and has been reviewed within 24 hours of admission but prior to surgery or a procedure requiring anesthesia services, the patient has been examined, and no change has occured in the patients condition since the H&P was completed.       Change in medications: No              Comments:     2.  Patient continues to be appropriate candidate for planned surgical procedure. YES    Esmond Plants, MD

## 2023-05-12 NOTE — Discharge Instructions (Signed)
Nurse visit on 03/20/23 8:30am.   Call the office with any questions or concerns.  DO NOT REMOVE DRESSING.

## 2023-05-13 ENCOUNTER — Telehealth (INDEPENDENT_AMBULATORY_CARE_PROVIDER_SITE_OTHER): Payer: Self-pay | Admitting: Surgery

## 2023-05-20 ENCOUNTER — Encounter (INDEPENDENT_AMBULATORY_CARE_PROVIDER_SITE_OTHER): Payer: Self-pay

## 2023-05-20 ENCOUNTER — Other Ambulatory Visit: Payer: Self-pay

## 2023-05-20 ENCOUNTER — Ambulatory Visit (INDEPENDENT_AMBULATORY_CARE_PROVIDER_SITE_OTHER): Payer: MEDICARE

## 2023-05-20 VITALS — HR 86 | Temp 98.1°F | Resp 18 | Ht 72.0 in | Wt 220.0 lb

## 2023-05-20 DIAGNOSIS — L97519 Non-pressure chronic ulcer of other part of right foot with unspecified severity: Secondary | ICD-10-CM

## 2023-05-20 DIAGNOSIS — E08621 Diabetes mellitus due to underlying condition with foot ulcer: Secondary | ICD-10-CM

## 2023-05-20 DIAGNOSIS — M7989 Other specified soft tissue disorders: Secondary | ICD-10-CM

## 2023-05-20 NOTE — Nursing Note (Signed)
Patient presents today for right unna boot application, unna boot applied patient tolerated well with no complications or concerns. Margit Hanks, LPN  16/04/9603 08:57

## 2023-05-27 ENCOUNTER — Other Ambulatory Visit: Payer: Self-pay

## 2023-05-27 ENCOUNTER — Ambulatory Visit (INDEPENDENT_AMBULATORY_CARE_PROVIDER_SITE_OTHER): Payer: MEDICARE

## 2023-05-27 ENCOUNTER — Encounter (INDEPENDENT_AMBULATORY_CARE_PROVIDER_SITE_OTHER): Payer: Self-pay

## 2023-05-27 VITALS — Ht 72.0 in | Wt 220.0 lb

## 2023-05-27 DIAGNOSIS — L97519 Non-pressure chronic ulcer of other part of right foot with unspecified severity: Secondary | ICD-10-CM

## 2023-05-27 NOTE — Nursing Note (Signed)
Patient here for unna boot application to right foot/leg. Moderate amount of serosanguineous drainage noted. Patient tolerated well.   Meriam Sprague, LPN  44/07/270 11:35

## 2023-05-29 ENCOUNTER — Ambulatory Visit (HOSPITAL_COMMUNITY): Payer: Self-pay

## 2023-05-30 NOTE — H&P (Signed)
GENERAL SURGERY, [facility]  [address]  [place] [address]    History and Physical    Name: Jeremy Sexton MRN:  [id number]   Date: 06/01/2023 DOB:  02/22/1956 (67 y.o.)                  Reason for Visit: General (Right foot debridement)    History of Present Illness  Mr. Pyon presents today for evaluation of his right foot wound.  Last evaluated by me with application of allograft on May 12, 2023.  I have not seen him since that time.  He has not new complaints at this time.  No fever.  No pain.              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         Current Outpatient Medications   Medication Sig    acetaminophen (TYLENOL) 500 mg Oral Tablet Take 1 Tablet (500 mg total) by mouth Every 4 hours as needed for Pain    atenoloL (TENORMIN) 50 mg Oral Tablet Take 1 Tablet (50 mg total) by mouth Once a day    atorvastatin (LIPITOR) 20 mg Oral Tablet Take 1 Tablet (20 mg total) by mouth Every evening    cloNIDine HCL (CATAPRES) 0.1 mg Oral Tablet Take 1 Tablet (0.1 mg total) by mouth Twice daily    furosemide (LASIX) 20 mg Oral Tablet Take 1 Tablet (20 mg total) by mouth Once a day    gabapentin (NEURONTIN) 400 mg Oral Capsule Take 1 Capsule (400 mg total) by mouth Three times a day    lisinopriL (PRINIVIL) 10 mg Oral Tablet Take 1 Tablet (10 mg total) by mouth Once a day    metFORMIN (GLUCOPHAGE) 500 mg Oral Tablet Take 1 Tablet (500 mg total) by mouth Twice daily with food    omeprazole (PRILOSEC) 40 mg Oral Capsule, Delayed Release(E.C.) Take 1 Capsule (40 mg total) by mouth Once a day    spironolactone (ALDACTONE) 100 mg Oral Tablet Take 1 Tablet (100 mg total) by mouth Every  morning with breakfast    traZODone (DESYREL) 50 mg Oral Tablet Take 1 Tablet (50 mg total) by mouth Every night     Allergies   Allergen Reactions    Tetracyclines Rash    Tetracycline      Family Medical History:    None         Social History     Tobacco Use    Smoking status: Never     Passive exposure: Never    Smokeless tobacco: Never   Vaping Use    Vaping status: Never Used   Substance Use Topics    Alcohol use: Never     Comment: Previous, quit 1 year ago    Drug use: Never            Physical Examination:  Vitals:    06/01/23 1256   BP: (!) 145/70   Pulse:  81   Resp: 18   Temp: 36.7 C (98 F)   SpO2: 99%   Weight: 88.5 kg (195 lb)   Height: 1.829 m (6')   BMI: 26.5      General: appropriate for age. in no acute distress.    Vital signs are present above and have been reviewed by me     HEENT: Atraumatic, Normocephalic.    Lungs: Nonlabored breathing with symmetric expansion    Heart:Regular wth respect to rate.    Abdomen:Soft. Nontender. Nondistended     Right foot: Definite improvement post Apligraft.  Much smaller overall size.  Less induration.          Psychiatric: Alert and oriented to person, place, and time. affect appropriate      Assessment and Plan    ICD-10-CM    1. Diabetic ulcer of other part of right foot associated with diabetes mellitus due to underlying condition, unspecified ulcer stage (CMS HCC)  C62.376     L97.519             Definitely improved.  Will plan for additional Apligraft placement. He is willing to proceed  All questions answered to his satisfaction.          I appreciate the opportunity to be involved in the care of your patients.  If you have any questions or concerns regarding this encounter, please do not hesitate to contact me at your convenience.      Maura Crandall MD MBA CPE FACS     This note may have been partially generated using MModal Fluency Direct system, and there may be some incorrect words, spellings, and punctuation that were not noted in checking the  note before saving, though effort was made to avoid such errors.

## 2023-05-30 NOTE — H&P (View-Only) (Signed)
 GENERAL SURGERY, [facility]  [address]  [place] [address]    History and Physical    Name: Jeremy Sexton MRN:  [id number]   Date: 06/01/2023 DOB:  02/22/1956 (67 y.o.)                  Reason for Visit: General (Right foot debridement)    History of Present Illness  Jeremy Sexton presents today for evaluation of his right foot wound.  Last evaluated by me with application of allograft on May 12, 2023.  I have not seen him since that time.  He has not new complaints at this time.  No fever.  No pain.              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         Current Outpatient Medications   Medication Sig    acetaminophen (TYLENOL) 500 mg Oral Tablet Take 1 Tablet (500 mg total) by mouth Every 4 hours as needed for Pain    atenoloL (TENORMIN) 50 mg Oral Tablet Take 1 Tablet (50 mg total) by mouth Once a day    atorvastatin (LIPITOR) 20 mg Oral Tablet Take 1 Tablet (20 mg total) by mouth Every evening    cloNIDine HCL (CATAPRES) 0.1 mg Oral Tablet Take 1 Tablet (0.1 mg total) by mouth Twice daily    furosemide (LASIX) 20 mg Oral Tablet Take 1 Tablet (20 mg total) by mouth Once a day    gabapentin (NEURONTIN) 400 mg Oral Capsule Take 1 Capsule (400 mg total) by mouth Three times a day    lisinopriL (PRINIVIL) 10 mg Oral Tablet Take 1 Tablet (10 mg total) by mouth Once a day    metFORMIN (GLUCOPHAGE) 500 mg Oral Tablet Take 1 Tablet (500 mg total) by mouth Twice daily with food    omeprazole (PRILOSEC) 40 mg Oral Capsule, Delayed Release(E.C.) Take 1 Capsule (40 mg total) by mouth Once a day    spironolactone (ALDACTONE) 100 mg Oral Tablet Take 1 Tablet (100 mg total) by mouth Every  morning with breakfast    traZODone (DESYREL) 50 mg Oral Tablet Take 1 Tablet (50 mg total) by mouth Every night     Allergies   Allergen Reactions    Tetracyclines Rash    Tetracycline      Family Medical History:    None         Social History     Tobacco Use    Smoking status: Never     Passive exposure: Never    Smokeless tobacco: Never   Vaping Use    Vaping status: Never Used   Substance Use Topics    Alcohol use: Never     Comment: Previous, quit 1 year ago    Drug use: Never            Physical Examination:  Vitals:    06/01/23 1256   BP: (!) 145/70   Pulse:  81   Resp: 18   Temp: 36.7 C (98 F)   SpO2: 99%   Weight: 88.5 kg (195 lb)   Height: 1.829 m (6')   BMI: 26.5      General: appropriate for age. in no acute distress.    Vital signs are present above and have been reviewed by me     HEENT: Atraumatic, Normocephalic.    Lungs: Nonlabored breathing with symmetric expansion    Heart:Regular wth respect to rate.    Abdomen:Soft. Nontender. Nondistended     Right foot: Definite improvement post Apligraft.  Much smaller overall size.  Less induration.          Psychiatric: Alert and oriented to person, place, and time. affect appropriate      Assessment and Plan    ICD-10-CM    1. Diabetic ulcer of other part of right foot associated with diabetes mellitus due to underlying condition, unspecified ulcer stage (CMS HCC)  C62.376     L97.519             Definitely improved.  Will plan for additional Apligraft placement. He is willing to proceed  All questions answered to his satisfaction.          I appreciate the opportunity to be involved in the care of your patients.  If you have any questions or concerns regarding this encounter, please do not hesitate to contact me at your convenience.      Maura Crandall MD MBA CPE FACS     This note may have been partially generated using MModal Fluency Direct system, and there may be some incorrect words, spellings, and punctuation that were not noted in checking the  note before saving, though effort was made to avoid such errors.

## 2023-06-01 ENCOUNTER — Other Ambulatory Visit (INDEPENDENT_AMBULATORY_CARE_PROVIDER_SITE_OTHER): Payer: Self-pay | Admitting: Surgery

## 2023-06-01 ENCOUNTER — Other Ambulatory Visit: Payer: Self-pay

## 2023-06-01 ENCOUNTER — Encounter (INDEPENDENT_AMBULATORY_CARE_PROVIDER_SITE_OTHER): Payer: Self-pay | Admitting: Surgery

## 2023-06-01 ENCOUNTER — Ambulatory Visit (INDEPENDENT_AMBULATORY_CARE_PROVIDER_SITE_OTHER): Payer: MEDICARE | Admitting: Surgery

## 2023-06-01 VITALS — BP 145/70 | HR 81 | Temp 98.0°F | Resp 18 | Ht 72.0 in | Wt 195.0 lb

## 2023-06-01 DIAGNOSIS — S91301A Unspecified open wound, right foot, initial encounter: Secondary | ICD-10-CM

## 2023-06-01 DIAGNOSIS — E08621 Diabetes mellitus due to underlying condition with foot ulcer: Secondary | ICD-10-CM

## 2023-06-01 DIAGNOSIS — L97519 Non-pressure chronic ulcer of other part of right foot with unspecified severity: Secondary | ICD-10-CM

## 2023-06-01 DIAGNOSIS — Z01818 Encounter for other preprocedural examination: Secondary | ICD-10-CM

## 2023-06-01 NOTE — Nursing Note (Signed)
Per Dr Sabino Gasser wet to dry dressing applied to patients foot, patient tolerated well with no complications or concerns. Margit Hanks, LPN  54/0/9811 91:47

## 2023-06-03 ENCOUNTER — Ambulatory Visit (INDEPENDENT_AMBULATORY_CARE_PROVIDER_SITE_OTHER): Payer: Self-pay

## 2023-06-04 ENCOUNTER — Encounter (INDEPENDENT_AMBULATORY_CARE_PROVIDER_SITE_OTHER): Payer: Self-pay | Admitting: Surgery

## 2023-06-04 ENCOUNTER — Ambulatory Visit: Payer: MEDICARE | Attending: Surgery

## 2023-06-04 ENCOUNTER — Other Ambulatory Visit: Payer: Self-pay

## 2023-06-04 DIAGNOSIS — Z01818 Encounter for other preprocedural examination: Secondary | ICD-10-CM | POA: Insufficient documentation

## 2023-06-04 DIAGNOSIS — S91301A Unspecified open wound, right foot, initial encounter: Secondary | ICD-10-CM | POA: Insufficient documentation

## 2023-06-04 LAB — CBC WITH DIFF
BASOPHIL #: 0.1 10*3/uL (ref 0.00–0.10)
BASOPHIL %: 1 % (ref 0–1)
EOSINOPHIL #: 0.4 10*3/uL (ref 0.00–0.50)
EOSINOPHIL %: 4 % (ref 1–8)
HCT: 34.7 % — ABNORMAL LOW (ref 36.7–47.1)
HGB: 11.2 g/dL — ABNORMAL LOW (ref 12.5–16.3)
LYMPHOCYTE #: 2.1 10*3/uL (ref 1.00–3.00)
LYMPHOCYTE %: 23 % (ref 16–44)
MCH: 26 pg (ref 23.8–33.4)
MCHC: 32.2 g/dL — ABNORMAL LOW (ref 32.5–36.3)
MCV: 80.8 fL (ref 73.0–96.2)
MONOCYTE #: 0.8 10*3/uL (ref 0.30–1.00)
MONOCYTE %: 9 % (ref 5–13)
MPV: 8.5 fL (ref 7.4–11.4)
NEUTROPHIL #: 5.7 10*3/uL (ref 1.85–7.80)
NEUTROPHIL %: 63 % (ref 43–77)
PLATELETS: 299 10*3/uL (ref 140–440)
RBC: 4.29 10*6/uL (ref 4.06–5.63)
RDW: 16.5 % — ABNORMAL HIGH (ref 12.1–16.2)
WBC: 9.2 10*3/uL (ref 3.6–10.2)

## 2023-06-04 LAB — BASIC METABOLIC PANEL
ANION GAP: 6 mmol/L (ref 4–13)
BUN/CREA RATIO: 18 (ref 6–22)
BUN: 12 mg/dL (ref 7–25)
CALCIUM: 10 mg/dL (ref 8.6–10.3)
CHLORIDE: 105 mmol/L (ref 98–107)
CO2 TOTAL: 28 mmol/L (ref 21–31)
CREATININE: 0.66 mg/dL (ref 0.60–1.30)
ESTIMATED GFR: 103 mL/min/{1.73_m2} (ref >59)
GLUCOSE: 169 mg/dL — ABNORMAL HIGH (ref 74–109)
OSMOLALITY, CALCULATED: 281 mosm/kg (ref 270–290)
POTASSIUM: 3.6 mmol/L (ref 3.5–5.1)
SODIUM: 139 mmol/L (ref 136–145)

## 2023-06-08 ENCOUNTER — Encounter (INDEPENDENT_AMBULATORY_CARE_PROVIDER_SITE_OTHER): Payer: Self-pay | Admitting: Surgery

## 2023-06-11 ENCOUNTER — Encounter (HOSPITAL_COMMUNITY): Payer: Self-pay | Admitting: Surgery

## 2023-06-12 ENCOUNTER — Encounter (HOSPITAL_COMMUNITY): Payer: MEDICARE | Admitting: Surgery

## 2023-06-12 ENCOUNTER — Ambulatory Visit (HOSPITAL_COMMUNITY): Payer: MEDICARE | Admitting: Anesthesiology

## 2023-06-12 ENCOUNTER — Encounter (HOSPITAL_COMMUNITY): Payer: Self-pay | Admitting: Surgery

## 2023-06-12 ENCOUNTER — Ambulatory Visit
Admission: RE | Admit: 2023-06-12 | Discharge: 2023-06-12 | Disposition: A | Discharge status: Home / Self Care | Payer: MEDICARE | Attending: Surgery | Admitting: Surgery

## 2023-06-12 ENCOUNTER — Encounter (HOSPITAL_COMMUNITY)
Admission: RE | Disposition: A | Discharge status: Home / Self Care | Payer: Self-pay | Source: Home / Self Care | Attending: Surgery

## 2023-06-12 ENCOUNTER — Other Ambulatory Visit: Payer: Self-pay

## 2023-06-12 DIAGNOSIS — E11621 Type 2 diabetes mellitus with foot ulcer: Secondary | ICD-10-CM | POA: Insufficient documentation

## 2023-06-12 DIAGNOSIS — K219 Gastro-esophageal reflux disease without esophagitis: Secondary | ICD-10-CM | POA: Insufficient documentation

## 2023-06-12 DIAGNOSIS — Z7984 Long term (current) use of oral hypoglycemic drugs: Secondary | ICD-10-CM | POA: Insufficient documentation

## 2023-06-12 DIAGNOSIS — D649 Anemia, unspecified: Secondary | ICD-10-CM | POA: Insufficient documentation

## 2023-06-12 DIAGNOSIS — I10 Essential (primary) hypertension: Secondary | ICD-10-CM | POA: Insufficient documentation

## 2023-06-12 DIAGNOSIS — L97519 Non-pressure chronic ulcer of other part of right foot with unspecified severity: Secondary | ICD-10-CM | POA: Insufficient documentation

## 2023-06-12 DIAGNOSIS — E119 Type 2 diabetes mellitus without complications: Secondary | ICD-10-CM

## 2023-06-12 DIAGNOSIS — E785 Hyperlipidemia, unspecified: Secondary | ICD-10-CM | POA: Insufficient documentation

## 2023-06-12 DIAGNOSIS — Z79899 Other long term (current) drug therapy: Secondary | ICD-10-CM | POA: Insufficient documentation

## 2023-06-12 DIAGNOSIS — M539 Dorsopathy, unspecified: Secondary | ICD-10-CM | POA: Insufficient documentation

## 2023-06-12 HISTORY — DX: Polyneuropathy, unspecified: G62.9

## 2023-06-12 HISTORY — DX: Presence of spectacles and contact lenses: Z97.3

## 2023-06-12 HISTORY — DX: Gastro-esophageal reflux disease without esophagitis: K21.9

## 2023-06-12 HISTORY — DX: Dorsopathy, unspecified: M53.9

## 2023-06-12 SURGERY — PLACEMENT GRAFT FULL THICKNESS SKIN HEAD/NECK
Anesthesia: Monitor Anesthesia Care | Site: Foot | Laterality: Right | Wound class: Dirty or Infected Wounds-Include old traumatic wounds

## 2023-06-12 MED ORDER — SODIUM CHLORIDE 0.9 % (FLUSH) INJECTION SYRINGE
3.0000 mL | INJECTION | Freq: Three times a day (TID) | INTRAMUSCULAR | Status: DC
Start: 2023-06-12 — End: 2023-06-12

## 2023-06-12 MED ORDER — LACTATED RINGERS INTRAVENOUS SOLUTION
INTRAVENOUS | Status: DC
Start: 2023-06-12 — End: 2023-06-12

## 2023-06-12 MED ORDER — FAMOTIDINE (PF) 20 MG/2 ML INTRAVENOUS SOLUTION
20.0000 mg | Freq: Once | INTRAVENOUS | Status: AC
Start: 2023-06-12 — End: 2023-06-12
  Administered 2023-06-12: 20 mg via INTRAVENOUS

## 2023-06-12 MED ORDER — PROCHLORPERAZINE EDISYLATE 10 MG/2 ML (5 MG/ML) INJECTION SOLUTION
5.0000 mg | Freq: Once | INTRAMUSCULAR | Status: DC | PRN
Start: 2023-06-12 — End: 2023-06-12

## 2023-06-12 MED ORDER — MIDAZOLAM 5 MG/ML INJECTION WRAPPER
INTRAMUSCULAR | Status: AC
Start: 2023-06-12 — End: 2023-06-12
  Filled 2023-06-12: qty 1

## 2023-06-12 MED ORDER — SODIUM CHLORIDE 0.9 % (FLUSH) INJECTION SYRINGE
3.0000 mL | INJECTION | INTRAMUSCULAR | Status: DC | PRN
Start: 2023-06-12 — End: 2023-06-12

## 2023-06-12 MED ORDER — ONDANSETRON HCL (PF) 4 MG/2 ML INJECTION SOLUTION
4.0000 mg | Freq: Once | INTRAMUSCULAR | Status: DC | PRN
Start: 2023-06-12 — End: 2023-06-12

## 2023-06-12 MED ORDER — PROPOFOL 10 MG/ML IV BOLUS
INJECTION | Freq: Once | INTRAVENOUS | Status: DC | PRN
Start: 2023-06-12 — End: 2023-06-12
  Administered 2023-06-12: 40 mg via INTRAVENOUS

## 2023-06-12 MED ORDER — FENTANYL (PF) 50 MCG/ML INJECTION WRAPPER
INJECTION | Freq: Once | INTRAMUSCULAR | Status: DC | PRN
Start: 2023-06-12 — End: 2023-06-12
  Administered 2023-06-12 (×2): 50 ug via INTRAVENOUS

## 2023-06-12 MED ORDER — ONDANSETRON HCL (PF) 4 MG/2 ML INJECTION SOLUTION
4.0000 mg | Freq: Four times a day (QID) | INTRAMUSCULAR | Status: DC | PRN
Start: 2023-06-12 — End: 2023-06-12

## 2023-06-12 MED ORDER — LIDOCAINE (PF) 100 MG/5 ML (2 %) INTRAVENOUS SYRINGE
INJECTION | Freq: Once | INTRAVENOUS | Status: DC | PRN
Start: 2023-06-12 — End: 2023-06-12
  Administered 2023-06-12: 40 mg via INTRAVENOUS

## 2023-06-12 MED ORDER — MIDAZOLAM 5 MG/ML INJECTION WRAPPER
2.0000 mg | Freq: Once | INTRAMUSCULAR | Status: DC | PRN
Start: 2023-06-12 — End: 2023-06-12
  Administered 2023-06-12: 2 mg via INTRAVENOUS

## 2023-06-12 MED ORDER — FENTANYL (PF) 50 MCG/ML INJECTION WRAPPER
50.0000 ug | INJECTION | INTRAMUSCULAR | Status: DC | PRN
Start: 2023-06-12 — End: 2023-06-12

## 2023-06-12 MED ORDER — ROPIVACAINE (PF) 2 MG/ML (0.2 %) INJECTION SOLUTION
INTRAMUSCULAR | Status: AC
Start: 2023-06-12 — End: 2023-06-12
  Filled 2023-06-12: qty 20

## 2023-06-12 MED ORDER — ONDANSETRON HCL (PF) 4 MG/2 ML INJECTION SOLUTION
4.0000 mg | Freq: Once | INTRAMUSCULAR | Status: AC
Start: 2023-06-12 — End: 2023-06-12
  Administered 2023-06-12: 4 mg via INTRAVENOUS

## 2023-06-12 MED ORDER — ONDANSETRON HCL (PF) 4 MG/2 ML INJECTION SOLUTION
INTRAMUSCULAR | Status: AC
Start: 2023-06-12 — End: 2023-06-12
  Filled 2023-06-12: qty 2

## 2023-06-12 MED ORDER — FENTANYL (PF) 50 MCG/ML INJECTION SOLUTION
INTRAMUSCULAR | Status: AC
Start: 2023-06-12 — End: 2023-06-12
  Filled 2023-06-12: qty 2

## 2023-06-12 MED ORDER — HYDROCODONE 5 MG-ACETAMINOPHEN 325 MG TABLET
1.0000 | ORAL_TABLET | ORAL | Status: DC | PRN
Start: 2023-06-12 — End: 2023-06-12

## 2023-06-12 MED ORDER — LACTATED RINGERS INTRAVENOUS SOLUTION
INTRAVENOUS | Status: DC | PRN
Start: 2023-06-12 — End: 2023-06-12
  Administered 2023-06-12: 0 via INTRAVENOUS

## 2023-06-12 MED ORDER — FAMOTIDINE (PF) 20 MG/2 ML INTRAVENOUS SOLUTION
INTRAVENOUS | Status: AC
Start: 2023-06-12 — End: 2023-06-12
  Filled 2023-06-12: qty 2

## 2023-06-12 MED ORDER — ALBUTEROL SULFATE 2.5 MG/3 ML (0.083 %) SOLUTION FOR NEBULIZATION
2.5000 mg | INHALATION_SOLUTION | Freq: Once | RESPIRATORY_TRACT | Status: DC | PRN
Start: 2023-06-12 — End: 2023-06-12

## 2023-06-12 MED ORDER — IPRATROPIUM 0.5 MG-ALBUTEROL 3 MG (2.5 MG BASE)/3 ML NEBULIZATION SOLN
3.0000 mL | INHALATION_SOLUTION | Freq: Once | RESPIRATORY_TRACT | Status: DC | PRN
Start: 2023-06-12 — End: 2023-06-12

## 2023-06-12 MED ORDER — MIDAZOLAM 5 MG/ML INJECTION WRAPPER
Freq: Once | INTRAMUSCULAR | Status: DC | PRN
Start: 2023-06-12 — End: 2023-06-12
  Administered 2023-06-12: 2 mg via INTRAVENOUS
  Administered 2023-06-12: 3 mg via INTRAVENOUS

## 2023-06-12 SURGICAL SUPPLY — 37 items
BAG ISOL 20X20IN VDRP VNYL MOIST PRF SURF FLXB PNCT RST DRWSTRG STRL LF (DRAPE/PACKS/SHEETS/OR TOWEL) IMPLANT
BANDAGE 3.6YDX3.4IN 6 PLY HYPOALL LOFT LIGHT STRCH COTTON GAUZE STRL LF  DISP (WOUND CARE SUPPLY) IMPLANT
BANDAGE ELT 5.8YDX4IN NONST SLFCLS ELAS KNIT TEAL END STCH VELCRO COTTON POLY BLND STD LGTH COMPRESS (WOUND CARE SUPPLY) IMPLANT
BANDAGE SFFRM 75X4IN STRCH FNSH EDGE ABS PLSTR RYN GAUZE STRL LF  DISP (WOUND CARE SUPPLY) IMPLANT
BLADE 10 2 END CBNSTL SURG STRL DISP (SURGICAL CUTTING SUPPLIES) ×3 IMPLANT
BLADE 11 2 END CBNSTL SURG STRL DISP (SURGICAL CUTTING SUPPLIES) ×1 IMPLANT
BLADE THK.048IN 77.5X11X1.23MM RECIPROCATE SS SURG STRL (SURGICAL CUTTING SUPPLIES) IMPLANT
CLEANER ESURG TIP 2X2IN TIP POLISHR CAUT STRL LF (SURGICAL CUTTING SUPPLIES) IMPLANT
CONTAINR CLICKSEAL 4OZ TRANSLUC SCREW CAP STRL BLU SPECI PNEUM TUBE SYS (SPECIMEN COLLECTION SUPPLIES) IMPLANT
CONV USE 329911 - IMB ORTHO STD 20IN 25- IN KNEE ADJ 3 PNL POPLT PAD BRTHBL X (ORTHOPEDICS (NOT IMPLANTS)) IMPLANT
COUNTER 20 CNT BLOCK ADH NEEDLE STRL LF  RD SHARP FOAM 15.75X11.5X14IN DISP (MED SURG SUPPLIES) ×1 IMPLANT
COVER EQP 90X60IN HVDTY BCK PAD FNFLD BLU STRL (DRAPE/PACKS/SHEETS/OR TOWEL) ×1 IMPLANT
DETERGENT INSTR 22OZ TRNSPT GEL RINSE FREE NEUT PH PREKLENZ CLR PLSNT LF (MISCELLANEOUS PT CARE ITEMS) ×1 IMPLANT
DRAPE 76X55IN 3 QT HALYARD LF  STRL DISP SURG (DRAPE/PACKS/SHEETS/OR TOWEL) ×1 IMPLANT
DRAPE U SPLT IMPRV 70X60IN STRL SURG POLY 21X4IN (DRAPE/PACKS/SHEETS/OR TOWEL) IMPLANT
ELECTRODE ESURG BLADE PNCL 15FT VLAB EDGE TELESCP SMOKE EVAC (SURGICAL CUTTING SUPPLIES) IMPLANT
GLOVE SURG 7 LF  PF BEAD CUF STRL CRM 11.8IN PROTEXIS PI PLISPRN THK9.1 MIL (GLOVES AND ACCESSORIES) ×1 IMPLANT
GLOVE SURG 7.5 LF  PF BEAD CUF STRL CRM 11.8IN PROTEXIS PI PLISPRN THK9.1 MIL (GLOVES AND ACCESSORIES) ×3 IMPLANT
GOWN SURG LRG STD LGTH L3 HKLP CLSR RGLN SLEEVE TWL STRL LF  DISP GRN AERO BLU PRFRM FBRC (DRAPE/PACKS/SHEETS/OR TOWEL) ×3 IMPLANT
GRAFT SKIN APLIGRAF HUMAN FBRBL BVN CLGN MATRIX RND RGNRT (IMPLANTS GRAFT/TISSUE) ×1 IMPLANT
LABEL MED CORRECT MED LABELING SYS 4 FLG 2 SHEET 24 PRPRNT STRL (MED SURG SUPPLIES) ×1 IMPLANT
PAD ABDOMINAL 8X7.5IN LF  STRL (WOUND CARE SUPPLY) IMPLANT
PEN SURG MRKNG DISP RLR LBL STRL LF  6IN (MED SURG SUPPLIES) ×1 IMPLANT
SOL IRRG 0.9% NACL 1000ML PLASTIC PR BTL ISTNC N-PYRG STRL LF (MEDICATIONS/SOLUTIONS) ×1 IMPLANT
SPONGE GAUZE 4X4IN MDCHC COTTON 12 PLY TY 7 LF  STRL DISP (WOUND CARE SUPPLY) IMPLANT
SPONGE LAP 18X18IN PREWASH RIGID TRY STRL LF  WHT (MED SURG SUPPLIES) ×2 IMPLANT
STAPLER SKIN 4.1X6.5MM 35 W STPL CART LF  APS U DISP CLR SS PLASTIC (WOUND CARE SUPPLY) IMPLANT
STKNT ORTHO 60X6IN COTTON PLSTR 2 PLY PCUT SEWN END LF  TUB STRL OFF WHT (ORTHOPEDICS (NOT IMPLANTS)) IMPLANT
SUTURE 1 CT VICRYL 36IN VIOL BRD COAT ABS (SUTURE/WOUND CLOSURE) IMPLANT
SUTURE 2-0 VICRYL+ 18IN VIOL BRD 12 STRN COAT ABS (SUTURE/WOUND CLOSURE) IMPLANT
SUTURE SILK 0 SOFSILK 18IN BLK BRD TIE 6 STRN PCUT NONAB (SUTURE/WOUND CLOSURE) IMPLANT
SUTURE SILK 2-0 SOFSILK 18IN BLK BRD TIE 12 STRN PCUT NONAB (SUTURE/WOUND CLOSURE)
SUTURE SILK 2-0 SOFSILK 18IN BLK BRD TIE 12 STRN PCUT NONABSORB (SUTURE/WOUND CLOSURE) IMPLANT
SYRINGE GENTLCARE 60CC STRL CATH TIP PEEL PCH SUCT TRANSLUC BULB POLYPROP THRMPLST ELASTO DISP (MED SURG SUPPLIES) IMPLANT
TOWEL 24X16IN COTTON BLU DISP SURG STRL LF (DRAPE/PACKS/SHEETS/OR TOWEL) ×3 IMPLANT
TRAY ~~LOC~~ ARTHROSCOPY ~~LOC~~ - ~~LOC~~ COMMUNITY HOSP (CUSTOM TRAYS & PACK) IMPLANT
TUBING SUCT CLR 6FT .25IN ARGYLE PVC NCDTV STR MALE FEMALE MLD CONN STRL LF (MED SURG SUPPLIES) ×1 IMPLANT

## 2023-06-12 NOTE — Interval H&P Note (Signed)
[facility]      H&P UPDATE FORM                                                                                  Taekwon, Mischke, 67 y.o. male  Date of Admission:  06/12/2023  Date of Birth:  [birth year]    06/12/2023    STOP: IF H&P IS GREATER THAN 30 DAYS FROM SURGICAL DAY COMPLETE NEW H&P IS REQUIRED.     H & P updated the day of the procedure.  1.  H&P completed within 30 days of surgical procedure and has been reviewed within 24 hours of admission but prior to surgery or a procedure requiring anesthesia services, the patient has been examined, and no change has occured in the patients condition since the H&P was completed.       Change in medications: No              Comments:     2.  Patient continues to be appropriate candidate for planned surgical procedure. YES    Esmond Plants, MD

## 2023-06-12 NOTE — Anesthesia Transfer of Care (Signed)
ANESTHESIA TRANSFER OF CARE   Jeremy Sexton is a 67 y.o. ,male, Weight: 86.2 kg (190 lb)   had Procedure(s):  APLIGRAF APPLICATION TO RIGHT FOOT WOUND  performed  06/12/23   Primary Service: Esmond Plants, MD    Past Medical History:   Diagnosis Date   . Back problem    . Diabetes mellitus, type 2 (CMS HCC)    . Esophageal reflux    . High cholesterol    . HTN (hypertension)    . Peripheral neuropathy    . Wears glasses     "Reading glasses"      Allergy History as of 06/12/23       TETRACYCLINE         Noted Status Severity Type Reaction    06/11/23 1046 Delight H[address], RN 01/14/17 Deleted       11/17/22 1148 Young, [place], RN 01/14/17 Active                 TETRACYCLINES         Noted Status Severity Type Reaction    06/11/23 1046 Delight Hoh, RN 01/20/23 Active Medium  Rash, Itching    01/20/23 [address], RN 01/20/23 Active Medium  Rash              TREE NUTS         Noted Status Severity Type Reaction    06/11/23 1047 Delight Hoh, RN 06/11/23 Active Medium  Rash, Itching                  I completed my transfer of care / handoff to the receiving personnel during which we discussed:  Access, Airway, All key/critical aspects of case discussed, Analgesia, Antibiotics, Expectation of post procedure, Fluids/Product, Gave opportunity for questions and acknowledgement of understanding, Labs and PMHx      Post Location: PACU                                                           Last OR Temp: Temperature: 36.2 C (97.2 F)  ABG:  POTASSIUM   Date Value Ref Range Status   06/04/2023 3.6 3.5 - 5.1 mmol/L Final     KAPPA FREE LIGHT CHAINS   Date Value Ref Range Status   01/15/2023 1.82 1.25 - 3.25 mg/dL Final   16/04/9603 5.40 1.25 - 3.25 mg/dL Final     KAPPA/LAMBDA FLC RATIO   Date Value Ref Range Status   01/15/2023 1.17 0.80 - 2.10 Final   01/15/2023 1.17 0.80 - 2.10 Final     CALCIUM   Date Value Ref Range Status   06/04/2023 10.0 8.6 - 10.3 mg/dL Final     Calculated P Axis   Date Value  Ref Range Status   05/08/2023 70 degrees Final     Calculated R Axis   Date Value Ref Range Status   05/08/2023 56 degrees Final     Calculated T Axis   Date Value Ref Range Status   05/08/2023 97 degrees Final     Airway:* No LDAs found *  Blood pressure (!) 106/57, pulse 63, temperature 36.2 C (97.2 F), resp. rate 16, height 1.829 m (6'), weight 86.2 kg (190 lb), SpO2 100%.

## 2023-06-12 NOTE — Anesthesia Preprocedure Evaluation (Signed)
ANESTHESIA PRE-OP EVALUATION  Planned Procedure: APLIGRAF APPLICATION TO RIGHT FOOT WOUND (Right: Foot)  Review of Systems     anesthesia history negative               Pulmonary  negative pulmonary ROS,    Cardiovascular    Hypertension, ECG reviewed, ACE / ARB inhibitor use and hyperlipidemia , Exercise Tolerance: > or = 4 METS   ,beta blocker therapy  ,taken in last 24 hours     GI/Hepatic/Renal    GERD and well controlled        Endo/Other    anemia,   type 2 diabetes/ controlled with oral medications    Neuro/Psych/MS    back abnormality    peripheral neuropathy,  Cancer    negative hematology/oncology ROS,                     Physical Assessment      Airway       Mallampati: II                  Dental           (+) edentulous           Pulmonary    Breath sounds clear to auscultation       Cardiovascular    Rhythm: regular  Rate: Normal       Other findings              Plan  ASA 3     Planned anesthesia type: general                             Anesthesia issues/risks discussed are: PONV, Nerve Injuries, Cardiac Events/MI, Sore Throat, Aspiration, Stroke and Dental Injuries.  Anesthetic plan and risks discussed with patient  signed consent obtained          Patient's NPO status is appropriate for Anesthesia.

## 2023-06-12 NOTE — OR Surgeon (Signed)
[facility]      Patient Name: Jeremy, Sexton Number: [id number]  Date of Service: 06/12/2023   Date of Birth: May 08, 1956      Pre-Operative Diagnosis: RIGHT FOOT WOUND     Post-Operative Diagnosis: RIGHT FOOT WOUND    Procedure(s)/Description:  APLIGRAF APPLICATION TO RIGHT FOOT WOUND    Attending Surgeon: Esmond Plants, MD     Anesthesia:  CRNA: Floy Sabina, CRNA    Anesthesia Type: .Monitor Anesthesia Care     Estimated Blood Loss:  Minimal    Patient was taken to the operating room.  Given appropriate intravenous sedation.  Right foot prepped and draped.  Curette used to remove the superficial granulation tissue from the wound.  The overall size of the wound this point is 6 x 3 cm.  Apligraf was obtained and prepped on the back table.  It was subsequently placed across the wound without difficulty.  Secured with Steri-Strips.  Dressed appropriately.  Tolerated without difficulty.    Maura Crandall MD MBA CPE FACS

## 2023-06-12 NOTE — Anesthesia Postprocedure Evaluation (Signed)
Anesthesia Post Op Evaluation    Patient: Jeremy Sexton  Procedure(s):  APLIGRAF APPLICATION TO RIGHT FOOT WOUND    Last Vitals:Temperature: 36.2 C (97.2 F) (06/12/23 0739)  Heart Rate: 63 (06/12/23 0739)  BP (Non-Invasive): (!) 106/57 (06/12/23 0739)  Respiratory Rate: 16 (06/12/23 0739)  SpO2: 100 % (06/12/23 0739)    No notable events documented.    Patient is sufficiently recovered from the effects of anesthesia to participate in the evaluation and has returned to their pre-procedure level.  Patient location during evaluation: PACU       Patient participation: complete - patient participated  Level of consciousness: awake and alert and responsive to verbal stimuli    Pain score: 0  Pain management: adequate  Airway patency: patent    Anesthetic complications: no  Cardiovascular status: acceptable  Respiratory status: acceptable  Hydration status: acceptable  Patient post-procedure temperature: Pt Normothermic   PONV Status: Absent

## 2023-06-15 ENCOUNTER — Encounter (INDEPENDENT_AMBULATORY_CARE_PROVIDER_SITE_OTHER): Payer: Self-pay | Admitting: Surgery

## 2023-06-15 ENCOUNTER — Telehealth (INDEPENDENT_AMBULATORY_CARE_PROVIDER_SITE_OTHER): Payer: Self-pay | Admitting: Surgery

## 2023-06-19 ENCOUNTER — Ambulatory Visit (INDEPENDENT_AMBULATORY_CARE_PROVIDER_SITE_OTHER): Payer: MEDICARE

## 2023-06-19 ENCOUNTER — Encounter (INDEPENDENT_AMBULATORY_CARE_PROVIDER_SITE_OTHER): Payer: Self-pay

## 2023-06-19 ENCOUNTER — Other Ambulatory Visit: Payer: Self-pay

## 2023-06-19 VITALS — HR 88 | Temp 98.3°F | Resp 18 | Ht 72.0 in | Wt 190.0 lb

## 2023-06-19 DIAGNOSIS — S91301A Unspecified open wound, right foot, initial encounter: Secondary | ICD-10-CM

## 2023-06-19 DIAGNOSIS — L97519 Non-pressure chronic ulcer of other part of right foot with unspecified severity: Secondary | ICD-10-CM

## 2023-06-19 DIAGNOSIS — E08621 Diabetes mellitus due to underlying condition with foot ulcer: Secondary | ICD-10-CM

## 2023-06-19 NOTE — Nursing Note (Signed)
Patient presents to the office today post aplique skin graft to his right foot, Dressing removed steri strips came with the dressing due to the amount of drainage from the site. Right foot redressed with a wet to dry dressing per Dr Sabino Gasser, Patient tolerated well with no complications or concerns. Margit Hanks, LPN  56/21/3086 08:22

## 2023-06-28 NOTE — H&P (Unsigned)
GENERAL SURGERY, [facility] GENERAL SURGERY  [address]  [place] [address]    History and Physical    Name: Jeremy Sexton MRN:  [id number]   Date: 06/29/2023 DOB:  08/10/1955 (67 y.o.)                  Reason for Visit: Post Op (APLIGRAF APPLICATION TO RIGHT FOOT WOUND)    History of Present Illness  [name] presents today after recent Apligraf placement to the right foot partial transmetatarsal amputation site.  This was performed on November 15th.  Tolerated procedure without difficulty.  Has had 1 dressing change in the office since that time.    He has no complaints the current time.  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         Current Outpatient Medications   Medication Sig    atenoloL (TENORMIN) 50 mg Oral Tablet Take 1 Tablet (50 mg total) by mouth Once a day    atorvastatin (LIPITOR) 20 mg Oral Tablet Take 1 Tablet (20 mg total) by mouth Every evening    cloNIDine HCL (CATAPRES) 0.1 mg Oral Tablet Take 1 Tablet (0.1 mg total) by mouth Twice daily    ergocalciferol, vitamin D2, (DRISDOL) 1,250 mcg (50,000 unit) Oral Capsule Take 1 Capsule (50,000 Units total) by mouth Every 7 days    famotidine (PEPCID) 40 mg Oral Tablet Take 1 Tablet (40 mg total) by mouth Once a day    furosemide (LASIX) 20 mg Oral Tablet Take 1 Tablet (20 mg total) by mouth Once a day    gabapentin (NEURONTIN) 400 mg Oral Capsule Take 1 Capsule (400 mg total) by mouth Three times a day    lisinopriL (PRINIVIL) 40 mg Oral Tablet Take 1 Tablet (40 mg total) by mouth  Once a day    metFORMIN (GLUCOPHAGE) 500 mg Oral Tablet Take 1 Tablet (500 mg total) by mouth Twice daily with food    potassium chloride (K-DUR) 10 mEq Oral Tab Sust.Rel. Particle/Crystal Take 1 Tablet (10 mEq total) by mouth Once a day    traZODone (DESYREL) 50 mg Oral Tablet Take 1 Tablet (50 mg total) by mouth Every night     Allergies   Allergen Reactions    Cashew [Tree Nuts] Rash and Itching    Tetracyclines Rash and Itching     Family Medical History:    None         Social History     Tobacco Use    Smoking status: Never     Passive exposure: Never    Smokeless tobacco: Never   Vaping Use    Vaping status: Never Used   Substance Use Topics    Alcohol  use: Never     Comment: Previous, quit 1 year ago    Drug use: Never            Physical Examination:  Vitals:    06/29/23 0832   BP: (!) 179/79   Pulse: 78   Temp: 36.2 C (97.2 F)   SpO2: 98%   Weight: 90.7 kg (200 lb)   Height: 1.829 m (6')   BMI: 27.12      General: appropriate for age. in no acute distress.    Vital signs are present above and have been reviewed by me     HEENT: Atraumatic, Normocephalic.    Lungs: Nonlabored breathing with symmetric expansion    Heart:Regular wth respect to rate.    Abdomen:Soft. Nontender. Nondistended     Right foot continued smaller area of the wound.  No evidence of any secondary complications.  There still some induration of the forefoot around the edges but there was no expressible drainage.  The degree of soft tissue swelling has continued to improve.    Psychiatric: Alert and oriented to person, place, and time. affect appropriate      Assessment and Plan    ICD-10-CM    1. Open wound of right foot, subsequent encounter  S91.301D             Continues to improve.  No evidence of any secondary complications at this time.  Do not feel that the current size would benefit from a repeat Apligraft.  Follow up 2 weeks.          I appreciate the opportunity to be involved in the care of your patients.  If you have any  questions or concerns regarding this encounter, please do not hesitate to contact me at your convenience.      Maura Crandall MD MBA CPE FACS     This note may have been partially generated using MModal Fluency Direct system, and there may be some incorrect words, spellings, and punctuation that were not noted in checking the note before saving, though effort was made to avoid such errors.

## 2023-06-29 ENCOUNTER — Encounter (INDEPENDENT_AMBULATORY_CARE_PROVIDER_SITE_OTHER): Payer: Self-pay | Admitting: Surgery

## 2023-06-29 ENCOUNTER — Ambulatory Visit (INDEPENDENT_AMBULATORY_CARE_PROVIDER_SITE_OTHER): Payer: Self-pay | Admitting: Surgery

## 2023-06-29 ENCOUNTER — Other Ambulatory Visit: Payer: Self-pay

## 2023-06-29 VITALS — BP 179/79 | HR 78 | Temp 97.2°F | Ht 72.0 in | Wt 200.0 lb

## 2023-06-29 DIAGNOSIS — S91301D Unspecified open wound, right foot, subsequent encounter: Secondary | ICD-10-CM

## 2023-07-13 ENCOUNTER — Other Ambulatory Visit: Payer: Self-pay

## 2023-07-13 ENCOUNTER — Encounter (INDEPENDENT_AMBULATORY_CARE_PROVIDER_SITE_OTHER): Payer: Self-pay | Admitting: Surgery

## 2023-07-13 ENCOUNTER — Ambulatory Visit (INDEPENDENT_AMBULATORY_CARE_PROVIDER_SITE_OTHER): Payer: MEDICARE | Admitting: Surgery

## 2023-07-13 VITALS — BP 179/80 | HR 68 | Temp 97.9°F | Resp 18 | Ht 72.0 in | Wt 213.4 lb

## 2023-07-13 DIAGNOSIS — L97519 Non-pressure chronic ulcer of other part of right foot with unspecified severity: Secondary | ICD-10-CM

## 2023-07-13 DIAGNOSIS — E08621 Diabetes mellitus due to underlying condition with foot ulcer: Secondary | ICD-10-CM

## 2023-07-13 NOTE — Progress Notes (Signed)
GENERAL SURGERY, [facility]ICAL GROUP GENERAL SURGERY  [address]  [place] [address]    Progress Note    Name: Jeremy Sexton MRN:  [id number]   Date: 07/13/2023 DOB:  01/26/1956 (67 y.o.)              Date of Birth:  1956/03/11  PCP: Mliss Sax, DO  Referring:  No ref. provider found     HPI:  Jeremy Sexton is a 67 y.o. White male who returns for repeat evaluation of his right foot.  Has had ongoing dressing changes secondary to a diabetic ulceration.  Has had several Apliigraft placements which have  improved his healing significantly.  He has no complaints at the current time.  Is back to wearing normal footwear.        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      Allergies   Allergen Reactions    [place] Brick Nuts] Rash and Itching    Tetracyclines Rash and Itching           There were no vitals taken for this visit.         General: appropriate for age. in no acute distress.    Vital signs are present above and have been reviewed by me     HEENT: Atraumatic, Normocephalic.    Lungs: Nonlabored breathing with symmetric expansion    Heart:Regular wth respect to rate.    Abdomen:Soft. Nontender. Nondistended     Right foot:  Ongoing significant improvement of the size of the wound.  Good granulation tissue.  The degree of induration of the forefoot continues to improve with the each visit as well.        Psychiatric: Alert and oriented to person, place, and time. affect appropriate        Assessment/Plan:  Assessment/Plan   1. Diabetic ulcer of other part of right foot associated with diabetes mellitus due to underlying condition, unspecified ulcer stage (CMS HCC)         Continue current dressing changes.  Continues to improve.  Follow up in 3 weeks.      This note was partially created using voice recognition software and is inherently subject to errors including those of syntax and "sound alike " substitutions which may escape proof reading. In such instances, original meaning may be extrapolated by contextual derivation.    Maura Crandall MD MBA CPE FACS

## 2023-07-13 NOTE — Nursing Note (Signed)
Dressing applied to right foot.  Patient tolerated well.  Advised to change dressing as directed by Dr Sabino Gasser.  Patient verbalized understanding.  Thayer Dallas, LPN

## 2023-07-18 ENCOUNTER — Emergency Department
Admission: EM | Admit: 2023-07-18 | Discharge: 2023-07-18 | Disposition: A | Discharge status: Home / Self Care | Payer: MEDICARE | Source: Home / Self Care | Attending: Emergency Medicine | Admitting: Emergency Medicine

## 2023-07-18 ENCOUNTER — Emergency Department (HOSPITAL_COMMUNITY): Payer: MEDICARE

## 2023-07-18 ENCOUNTER — Other Ambulatory Visit: Payer: Self-pay

## 2023-07-18 ENCOUNTER — Encounter (HOSPITAL_COMMUNITY): Payer: Self-pay

## 2023-07-18 ENCOUNTER — Inpatient Hospital Stay (HOSPITAL_COMMUNITY): Payer: MEDICARE

## 2023-07-18 DIAGNOSIS — Z89421 Acquired absence of other right toe(s): Secondary | ICD-10-CM | POA: Insufficient documentation

## 2023-07-18 DIAGNOSIS — R600 Localized edema: Secondary | ICD-10-CM | POA: Insufficient documentation

## 2023-07-18 DIAGNOSIS — Z89411 Acquired absence of right great toe: Secondary | ICD-10-CM | POA: Insufficient documentation

## 2023-07-18 DIAGNOSIS — R590 Localized enlarged lymph nodes: Secondary | ICD-10-CM | POA: Insufficient documentation

## 2023-07-18 DIAGNOSIS — M7989 Other specified soft tissue disorders: Secondary | ICD-10-CM | POA: Insufficient documentation

## 2023-07-18 DIAGNOSIS — L03115 Cellulitis of right lower limb: Secondary | ICD-10-CM | POA: Insufficient documentation

## 2023-07-18 LAB — COMPREHENSIVE METABOLIC PANEL, NON-FASTING
ALBUMIN/GLOBULIN RATIO: 1.3 (ref 0.8–1.4)
ALBUMIN: 3.7 g/dL (ref 3.5–5.7)
ALKALINE PHOSPHATASE: 108 U/L — ABNORMAL HIGH (ref 34–104)
ALT (SGPT): 7 U/L (ref 7–52)
ANION GAP: 8 mmol/L (ref 4–13)
AST (SGOT): 9 U/L — ABNORMAL LOW (ref 13–39)
BILIRUBIN TOTAL: 0.4 mg/dL (ref 0.3–1.0)
BUN/CREA RATIO: 24 — ABNORMAL HIGH (ref 6–22)
BUN: 15 mg/dL (ref 7–25)
CALCIUM, CORRECTED: 9.8 mg/dL (ref 8.9–10.8)
CALCIUM: 9.6 mg/dL (ref 8.6–10.3)
CHLORIDE: 106 mmol/L (ref 98–107)
CO2 TOTAL: 26 mmol/L (ref 21–31)
CREATININE: 0.63 mg/dL (ref 0.60–1.30)
ESTIMATED GFR: 104 mL/min/{1.73_m2} (ref >59)
GLOBULIN: 2.9 (ref 2.0–3.5)
GLUCOSE: 148 mg/dL — ABNORMAL HIGH (ref 74–109)
OSMOLALITY, CALCULATED: 283 mosm/kg (ref 270–290)
POTASSIUM: 3.4 mmol/L — ABNORMAL LOW (ref 3.5–5.1)
PROTEIN TOTAL: 6.6 g/dL (ref 6.4–8.9)
SODIUM: 140 mmol/L (ref 136–145)

## 2023-07-18 LAB — CBC WITH DIFF
BASOPHIL #: 0.1 10*3/uL (ref 0.00–0.10)
BASOPHIL %: 1 % (ref 0–1)
EOSINOPHIL #: 0.6 10*3/uL — ABNORMAL HIGH (ref 0.00–0.50)
EOSINOPHIL %: 9 % — ABNORMAL HIGH (ref 1–8)
HCT: 32.5 % — ABNORMAL LOW (ref 36.7–47.1)
HGB: 10.7 g/dL — ABNORMAL LOW (ref 12.5–16.3)
LYMPHOCYTE #: 1.7 10*3/uL (ref 1.00–3.00)
LYMPHOCYTE %: 26 % (ref 16–44)
MCH: 25.5 pg (ref 23.8–33.4)
MCHC: 33 g/dL (ref 32.5–36.3)
MCV: 77.2 fL (ref 73.0–96.2)
MONOCYTE #: 0.5 10*3/uL (ref 0.30–1.00)
MONOCYTE %: 8 % (ref 5–13)
MPV: 7.5 fL (ref 7.4–11.4)
NEUTROPHIL #: 3.6 10*3/uL (ref 1.85–7.80)
NEUTROPHIL %: 56 % (ref 43–77)
PLATELETS: 263 10*3/uL (ref 140–440)
RBC: 4.21 10*6/uL (ref 4.06–5.63)
RDW: 15.7 % (ref 12.1–16.2)
WBC: 6.4 10*3/uL (ref 3.6–10.2)

## 2023-07-18 LAB — LACTIC ACID LEVEL W/ REFLEX FOR LEVEL >2.0: LACTIC ACID: 1.5 mmol/L (ref 0.5–2.2)

## 2023-07-18 LAB — PTT (PARTIAL THROMBOPLASTIN TIME): APTT: 35 s (ref 25.0–38.0)

## 2023-07-18 LAB — PT/INR
INR: 1.09 (ref 0.84–1.10)
PROTHROMBIN TIME: 12.8 s — ABNORMAL HIGH (ref 9.8–12.7)

## 2023-07-18 MED ORDER — AMOXICILLIN 875 MG-POTASSIUM CLAVULANATE 125 MG TABLET
ORAL_TABLET | ORAL | Status: AC
Start: 2023-07-18 — End: 2023-07-18
  Filled 2023-07-18: qty 1

## 2023-07-18 MED ORDER — CLINDAMYCIN HCL 150 MG CAPSULE
ORAL_CAPSULE | ORAL | Status: AC
Start: 2023-07-18 — End: 2023-07-18
  Filled 2023-07-18: qty 3

## 2023-07-18 MED ORDER — AMOXICILLIN 500 MG-POTASSIUM CLAVULANATE 125 MG TABLET
1.0000 | ORAL_TABLET | ORAL | Status: DC
Start: 2023-07-18 — End: 2023-07-18

## 2023-07-18 MED ORDER — CLINDAMYCIN HCL 150 MG CAPSULE
ORAL_CAPSULE | ORAL | Status: AC
Start: 2023-07-18 — End: 2023-07-18
  Filled 2023-07-18: qty 6

## 2023-07-18 MED ORDER — AMOXICILLIN 875 MG-POTASSIUM CLAVULANATE 125 MG TABLET
ORAL_TABLET | ORAL | Status: AC
Start: 2023-07-18 — End: 2023-07-18
  Filled 2023-07-18: qty 2

## 2023-07-18 MED ORDER — AMOXICILLIN 875 MG-POTASSIUM CLAVULANATE 125 MG TABLET
1.0000 | ORAL_TABLET | Freq: Two times a day (BID) | ORAL | 0 refills | Status: AC
Start: 2023-07-18 — End: 2023-07-28

## 2023-07-18 MED ORDER — IOHEXOL 350 MG IODINE/ML INTRAVENOUS SOLUTION
100.0000 mL | INTRAVENOUS | Status: AC
Start: 2023-07-18 — End: 2023-07-18
  Administered 2023-07-18: 100 mL via INTRAVENOUS

## 2023-07-18 MED ORDER — CLINDAMYCIN HCL 150 MG CAPSULE
450.0000 mg | ORAL_CAPSULE | Freq: Three times a day (TID) | ORAL | 0 refills | Status: AC
Start: 2023-07-18 — End: 2023-07-25

## 2023-07-18 MED ORDER — CLINDAMYCIN HCL 150 MG CAPSULE
300.0000 mg | ORAL_CAPSULE | ORAL | Status: DC
Start: 2023-07-18 — End: 2023-07-18

## 2023-07-18 NOTE — ED Nurses Note (Signed)
Patient asked to wait to attempt to obtain IV access unless we do need it.

## 2023-07-18 NOTE — ED Nurses Note (Signed)
Patient to ED30 with complaints of redness and red streaks present to right lower leg that were noticed this morning. 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 He reports that the red streaks down his leg are the main reason he is here today. Patient reports he has numbness and tingling in bilateral feet but that has been present for an extended period of time

## 2023-07-18 NOTE — ED Triage Notes (Signed)
Rt Leg edema hx toes amp month ago . States this am has red streaking and increase pain

## 2023-07-18 NOTE — ED Provider Notes (Signed)
CHIEF COMPLAINT  Chief Complaint   Patient presents with    Leg Swelling     HISTORY OF PRESENT ILLNESS  Jeremy Sexton, date of birth March 26, 1956, is a 67 y.o. male who presented to the Emergency Department leg pain and swelling.  He has a toes amputated back in July of this year.  Slow healing but although it is doing okay.  Then he started having swelling in red streaks and increasing pain to the right lower leg.  No fever chills no nausea or vomiting no chest pain no shortness a breath.    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       Family Medical History:    None       Social History     Socioeconomic History    Marital status: Widowed   Tobacco Use    Smoking status: Never     Passive exposure: Never    Smokeless tobacco: Never   Vaping Use    Vaping status: Never Used   Substance and Sexual Activity    Alcohol use: Never     Comment: Previous, quit 1 year ago    Drug use: Never    Sexual activity: Not Currently   Other Topics Concern    Ability to Walk 1 Flight of Steps without SOB/CP Yes    Routine Exercise Yes    Ability to Walk 2 Flight of Steps without SOB/CP Yes    Unable to Ambulate No    Total Care No    Ability To Do Own ADL's Yes    Uses Walker No    Other Activity Level Yes    Uses Cane No     Social Determinants of Health     Social Connections: Low Risk  (01/20/2023)    Social Connections     SDOH Social Isolation: 5 or more times a week      ALLERGIES  Allergies   Allergen Reactions    Cashew [Tree Nuts] Rash and Itching    Tetracyclines Rash and Itching       PHYSICAL  EXAM  VITAL SIGNS:  Filed Vitals:    07/18/23 1715 07/18/23 1800 07/18/23 1815 07/18/23 1830   BP:       Pulse:       Resp:       Temp: 36.7 C (98.1 F)      SpO2: 95% 97% 96% 97%     GENERAL: PATIENT IS ALERT AND ORIENTED TO PERSON, PLACE, AND TIME.  IN NO DISTRESS  HEAD: NORMOCEPHALIC AND ATRAUMATIC.  EYES: PUPILS EQUALLY ROUND AND REACT TO LIGHT. EXTRAOCULAR MOVEMENTS INTACT.  EARS: GROSS HEARING INTACT. EXTERNAL EARS WITHIN NORMAL LIMITS.  THROAT: MOIST ORAL MUCOSA. NO ERYTHEMA OR EXUDATE OF THE PHARYNX.  NECK: SUPPLE. TRACHEA MIDLINE.  NO LYMPHADENOPATHY  CARDIOVASCULAR: REGULAR, RATE, AND RHYTHM. NO  MURMUR.  LUNGS: CLEAR TO AUSCULTATION BILATERAL.  ABDOMEN: SOFT, NON-TENDER, NON-DISTENDED, AND BOWEL SOUNDS ARE PRESENT.  GENITOURINARY: DEFERRED.  RECTAL: DEFERRED.  EXTREMITIES: NO CYANOSIS, CLUBBING, OR EDEMA.  He has not open non healed wound with the toes were amputated the Leah's swollen in his almost of reticular pattern going from the foot to the knee.  We are just below-the-knee I should say.  SKIN: WARM AND DRY.  NEUROLOGIC: CRANIAL NERVES II THROUGH XII ARE GROSSLY INTACT ALTHOUGH NOT INDIVIDUALLY TESTED.  No gross motor deficits  PSYCHIATRIC: JUDGMENT AND INSIGHT ARE SEEMINGLY INTACT. MOOD AND AFFECT ARE APPROPRIATE FOR THE SITUATION.    PROCEDURES    DIAGNOSTICS  Labs:  Labs listed below were reviewed and interpreted by me.  Results for orders placed or performed during the hospital encounter of 07/18/23   COMPREHENSIVE METABOLIC PANEL, NON-FASTING   Result Value Ref Range    SODIUM 140 136 - 145 mmol/L    POTASSIUM 3.4 (L) 3.5 - 5.1 mmol/L    CHLORIDE 106 98 - 107 mmol/L    CO2 TOTAL 26 21 - 31 mmol/L    ANION GAP 8 4 - 13 mmol/L    BUN 15 7 - 25 mg/dL    CREATININE 0.98 1.19 - 1.30 mg/dL    BUN/CREA RATIO 24 (H) 6 - 22    ESTIMATED GFR 104 >59 mL/min/1.50m^2    ALBUMIN 3.7 3.5 - 5.7 g/dL    CALCIUM 9.6 8.6 - 14.7 mg/dL    GLUCOSE 829 (H) 74 - 109 mg/dL    ALKALINE PHOSPHATASE 108 (H) 34 - 104 U/L     ALT (SGPT) 7 7 - 52 U/L    AST (SGOT) 9 (L) 13 - 39 U/L    BILIRUBIN TOTAL 0.4 0.3 - 1.0 mg/dL    PROTEIN TOTAL 6.6 6.4 - 8.9 g/dL    ALBUMIN/GLOBULIN RATIO 1.3 0.8 - 1.4    OSMOLALITY, CALCULATED 283 270 - 290 mOsm/kg    CALCIUM, CORRECTED 9.8 8.9 - 10.8 mg/dL    GLOBULIN 2.9 2.0 - 3.5   LACTIC ACID LEVEL W/ REFLEX FOR LEVEL >2.0   Result Value Ref Range    LACTIC ACID 1.5 0.5 - 2.2 mmol/L   PT/INR   Result Value Ref Range    PROTHROMBIN TIME 12.8 (H) 9.8 - 12.7 seconds    INR 1.09 0.84 - 1.10   PTT (PARTIAL THROMBOPLASTIN TIME)   Result Value Ref Range    APTT 35.0 25.0 - 38.0 seconds   CBC WITH DIFF   Result Value Ref Range    WBC 6.4 3.6 - 10.2 x10^3/uL    RBC 4.21 4.06 - 5.63 x10^6/uL    HGB 10.7 (L) 12.5 - 16.3 g/dL    HCT 56.2 (L) 13.0 - 47.1 %    MCV 77.2 73.0 - 96.2 fL    MCH 25.5 23.8 - 33.4 pg    MCHC 33.0 32.5 - 36.3 g/dL    RDW 86.5 78.4 - 69.6 %    PLATELETS 263 140 - 440 x10^3/uL    MPV 7.5 7.4 - 11.4 fL    NEUTROPHIL % 56 43 - 77 %    LYMPHOCYTE % 26 16 - 44 %    MONOCYTE % 8 5 - 13 %    EOSINOPHIL % 9 (H) 1 - 8 %    BASOPHIL % 1 0 - 1 %    NEUTROPHIL # 3.60 1.85 - 7.80 x10^3/uL    LYMPHOCYTE # 1.70 1.00 -  3.00 x10^3/uL    MONOCYTE # 0.50 0.30 - 1.00 x10^3/uL    EOSINOPHIL # 0.60 (H) 0.00 - 0.50 x10^3/uL    BASOPHIL # 0.10 0.00 - 0.10 x10^3/uL     Radiology:  Results for orders placed or performed during the hospital encounter of 07/18/23   XR FOOT RIGHT 2 VIEW     Status: None    Narrative    Kadin W Glendening    RADIOLOGIST: Marcelyn Ditty, MD    XR FOOT RIGHT 2 VIEWS performed on 07/18/2023 12:28 PM    CLINICAL HISTORY: surgery approx 1 month ago now increased redness and swelling.  1st-3rd toes amputated, Pt complains of swelling and red lines on lower RT leg w/o pain    TECHNIQUE:  2 views of the right foot.    COMPARISON:    October 2024 MRI  June 2024 CT and x-rays        Impression    Post amputation first, second and third rays at the proximal third of the metatarsals  Possible soft tissue  gas adjacent to the distal aspect of remaining first metatarsal  No other signs of gas within the soft tissues  No radiopaque foreign body  Diffuse soft tissue swelling                       Radiologist location ID: [id number]     CT EXTREMITY LOWER RIGHT W IV CONTRAST     Status: None    Narrative    RADIOLOGIST: Kelby Frame    CT EXTREMITY LOWER RIGHT W IV CONTRAST performed on 07/18/2023 5:42 PM    CLINICAL HISTORY: Swelling possible subcutaneous gas at recent amputation site.  Swelling possible subcutaneous gas at recent amputation site    TECHNIQUE:  Right lower leg to include the foot CT without intravenous contrast.      COMPARISON: Plain film of the foot from earlier today    FINDINGS:  Bones: As discussed on the x-ray there has been amputation at the foot on the right side. Amputation has been performed at the first through third toes with portions of the metatarsals remaining. Small bone fragments are seen at the amputation site of the second and third metatarsal region. The date of surgery is not provided in the history. Nonspecific appearance of the distal aspect of the sites of amputation. Bony irregularity is present in these areas. There is bony demineralization throughout the foot and lower extremity overall. No fractures of the tibia or fibula. No fractures at the knee. No definite acute hindfoot or midfoot fracture is appreciated.    Joints: No dislocations at the knee or ankle. No dislocation at the subtalar joint, talonavicular joint    Soft Tissues: There is extensive the subcutaneous reticular changes with skin thickening along the right leg from the knee to the foot. No definite foreign bodies are appreciated. Vascular soft tissue calcifications are numerous. Significant focal swelling around the site of the interpretation with a small focus of the tiny gas within the soft tissues near the head of the first metatarsal. There are no other areas of soft tissue gas at the proximal foot or  involving the area of the right tibia or fibula.        Impression    SIGNIFICANT EDEMA/CELLULITIS PATTERN THROUGHOUT THE FOOT AND LOWER LEG  NO DEFINITE FOCAL FLUID COLLECTIONS OR GAS AT THE LEVEL OF THE TIBIA OR FIBULA OR HINDFOOT    SIGNIFICANT FOCAL SOFT  TISSUE SWELLING AND A TINY FOCUS OF GAS IN SOFT TISSUES NEAR THE HEAD OF THE FIRST METATARSAL. EQUIVOCAL FOR POSSIBLE INFECTION, CORRELATE WITH THE DATE OF SURGERY    EQUIVOCAL IRREGULARITY ALONG THE POSTERIOR AMPUTATION SITE PARTICULARLY AT THE HEAD OF THE FIRST METATARSAL. ITS DIFFICULT TO EXCLUDE RECURRENT INFECTION VERSUS POSTOPERATIVE CHANGE WITH THIS TYPE OF EXAM. FURTHER INVESTIGATION WITH THREE-PHASE BONE SCAN OR MRI COULD BE CONSIDERED DEPENDING ON THE CLINICAL SCENARIO      One or more dose reduction techniques were used (e.g., Automated exposure control, adjustment of the mA and/or kV according to patient size, use of iterative reconstruction technique).      Radiologist location ID: [id number]         ED COURSE/MEDICAL DECISION MAKING  Medications Administered in the ED   amoxicillin-clavulanate (AUGMENTIN) 500-125mg  per tablet (has no administration in time range)   clindamycin (CLEOCIN) capsule (has no administration in time range)   iohexol (OMNIPAQUE 350) infusion (100 mL Intravenous Given 07/18/23 1750)          Medical Decision Making  Differential includes thrombophlebitis deep vein thrombosis, cellulitis, necrotizing fasciitis, ostium myelitis    CT scan was cellulitis.  There is some gas in the foot area.  The foot is still open at the end.  The patient herself is not concerned with the foot he says that it is unchanged in fact gradually improving.  He is concern is the swelling in the leg that has new.    Amount and/or Complexity of Data Reviewed  Labs:  Decision-making details documented in ED Course.  Radiology:  Decision-making details documented in ED Course.      CRITICAL CARE    CLINICAL IMPRESSION  Clinical Impression   Swelling    Cellulitis (Primary)     DISPOSITION  Discharged       DISCHARGE MEDICATIONS  Current Discharge Medication List        START taking these medications    Details   amoxicillin-pot clavulanate (AUGMENTIN) 875-125 mg Oral Tablet Take 1 Tablet by mouth Twice daily for 10 days  Qty: 20 Tablet, Refills: 0      clindamycin (CLEOCIN) 150 mg Oral Capsule Take 3 Capsules (450 mg total) by mouth Three times a day for 7 days  Qty: 63 Capsule, Refills: 0             Gala Romney Kinnie Scales M.D.   07/18/2023, 12:50   [facility]  Department of Emergency Medicine  [facility]    This note was partially generated using MModal Fluency Direct system, and there may be some incorrect words, spellings, and punctuation that were not noted in checking the note before saving.    -----

## 2023-07-18 NOTE — ED Nurses Note (Signed)
Per Dr. Kinnie Scales patient needs to take Augmentin 875 twice daily and Clindamycin 450mg  three times a day. Patient provided a dose at this time to take along with doses for tomorrow due to his pharmacy not being open. Patient provided with enough medications to complete his doses until the pharmacy does open on Monday.

## 2023-07-18 NOTE — ED Nurses Note (Signed)
Patient discharged home with family.  AVS reviewed with patient/care giver.  A written copy of the AVS and discharge instructions was given to the patient/care giver.  Questions sufficiently answered as needed.  Patient/care giver encouraged to follow up with PCP as indicated.  In the event of an emergency, patient/care giver instructed to call 911 or go to the nearest emergency room.   Patient ambulated out of ED independently. Offered patient to stay after antibiotics to ensure no reaction. Patient reports that he feels like he will be fine and no need to stay.

## 2023-07-23 LAB — ADULT ROUTINE BLOOD CULTURE, SET OF 2 BOTTLES (BACTERIA AND YEAST)
BLOOD CULTURE, ROUTINE: NO GROWTH
BLOOD CULTURE, ROUTINE: NO GROWTH

## 2023-08-01 NOTE — Progress Notes (Signed)
 GENERAL SURGERY, [facility] MEDICAL GROUP GENERAL SURGERY  [address]  [place] [address]    Progress Note    Name: YASHAR HEMME MRN:  [id number]   Date: 08/03/2023 DOB:  [birth year] (68 y.o.)              Date of Birth:  Jan 09, 1956  PCP: Mliss Sax, DO  Referring:  No ref. provider found     HPI:  Jeremy Sexton is a 68 y.o. White male who returns for ongoing evaluation of his right foot wound.  No complaints.  Did go to the emergency room once since I last saw him secondary to swelling in his calf.  Underwent a CT scan at that time which was positive only for some postsurgical changes with the possibility of some irregularity at the head of the 1st metatarsal.  Was placed on antibiotics at that time and has noticed improvement.        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      Outpatient Medications Marked as Taking for the 08/03/23 encounter (Office Visit) with Esmond Plants, MD   Medication Sig    atenoloL (TENORMIN) 50 mg Oral Tablet Take 1 Tablet (50 mg total) by mouth Once a day    atorvastatin (LIPITOR) 20 mg Oral Tablet Take 1 Tablet (20 mg total) by mouth Every evening    cloNIDine HCL (CATAPRES) 0.1 mg Oral Tablet Take 1 Tablet (0.1 mg total) by mouth Twice daily    ergocalciferol, vitamin D2, (DRISDOL) 1,250 mcg (50,000 unit) Oral Capsule Take 1 Capsule (50,000 Units total) by mouth Every 7 days    famotidine (PEPCID) 40 mg Oral Tablet Take 1 Tablet (40 mg total) by mouth Once a day    furosemide (LASIX) 20 mg Oral Tablet Take 1 Tablet (20 mg total) by mouth Once a day    gabapentin (NEURONTIN) 400 mg Oral Capsule Take 1 Capsule (400 mg  total) by mouth Three times a day    lisinopriL (PRINIVIL) 40 mg Oral Tablet Take 1 Tablet (40 mg total) by mouth Once a day    metFORMIN (GLUCOPHAGE) 500 mg Oral Tablet Take 1 Tablet (500 mg total) by mouth Twice daily with food    potassium chloride (K-DUR) 10 mEq Oral Tab Sust.Rel. Particle/Crystal Take 1 Tablet (10 mEq total) by mouth Once a day    traZODone (DESYREL) 50 mg Oral Tablet Take 1 Tablet (50 mg total) by mouth Every night      Allergies   Allergen Reactions    Cashew [Tree Nuts] Rash and Itching    Tetracyclines Rash and Itching           BP (!) 160/77   Pulse 76   Temp 36.2 C (97.2 F)   Ht 1.829 m (6')   Wt 99.3 kg (219 lb)   SpO2 98%   BMI 29.70  kg/m          General: appropriate for age. in no acute distress.    Vital signs are present above and have been reviewed by me     HEENT: Atraumatic, Normocephalic.    Lungs: Nonlabored breathing with symmetric expansion    Heart:Regular wth respect to rate.    Abdomen:Soft. Nontender. Nondistended     Right foot opening continues to slowly heal by secondary intention.  Good granulation tissue.  The degree of soft tissue swelling in his foot continues to decrease with each visit as well.    Psychiatric: Alert and oriented to person, place, and time. affect appropriate       Assessment/Plan:  Assessment/Plan   1. Open wound of right foot, subsequent encounter         Seems unlikely as an active infectious focus with ongoing healing of the wound.  We will continue dressing changes for now.  He has completed the antibiotics given through the emergency room.  Recheck 3 weeks.  He is overall pleased with his response.  If he has any unfavorable progression would plan for an MRI of his foot at that time.      This note was partially created using voice recognition software and is inherently subject to errors including those of syntax and "sound alike " substitutions which may escape proof reading. In such instances, original meaning may be extrapolated  by contextual derivation.    Maura Crandall MD MBA CPE FACS

## 2023-08-03 ENCOUNTER — Ambulatory Visit (INDEPENDENT_AMBULATORY_CARE_PROVIDER_SITE_OTHER): Payer: Self-pay | Admitting: Surgery

## 2023-08-03 ENCOUNTER — Other Ambulatory Visit: Payer: Self-pay

## 2023-08-03 ENCOUNTER — Encounter (INDEPENDENT_AMBULATORY_CARE_PROVIDER_SITE_OTHER): Payer: Self-pay | Admitting: Surgery

## 2023-08-03 VITALS — BP 160/77 | HR 76 | Temp 97.2°F | Ht 72.0 in | Wt 219.0 lb

## 2023-08-03 DIAGNOSIS — S91301D Unspecified open wound, right foot, subsequent encounter: Secondary | ICD-10-CM

## 2023-08-24 ENCOUNTER — Ambulatory Visit (INDEPENDENT_AMBULATORY_CARE_PROVIDER_SITE_OTHER): Payer: MEDICARE | Admitting: Surgery

## 2023-08-24 ENCOUNTER — Other Ambulatory Visit: Payer: Self-pay

## 2023-08-24 ENCOUNTER — Encounter (INDEPENDENT_AMBULATORY_CARE_PROVIDER_SITE_OTHER): Payer: Self-pay | Admitting: Surgery

## 2023-08-24 VITALS — BP 157/69 | HR 69 | Temp 97.8°F | Wt 207.0 lb

## 2023-08-24 DIAGNOSIS — S91301D Unspecified open wound, right foot, subsequent encounter: Secondary | ICD-10-CM

## 2023-08-24 MED ORDER — COLLAGENASE CLOSTRIDIUM HISTOLYTICUM 250 UNIT/GRAM TOPICAL OINTMENT
TOPICAL_OINTMENT | Freq: Every day | CUTANEOUS | 1 refills | Status: DC
Start: 2023-08-24 — End: 2023-10-26

## 2023-08-24 NOTE — Progress Notes (Unsigned)
GENERAL SURGERY, [facility] MEDICAL GROUP GENERAL SURGERY  [address]  [place] [address]    Progress Note    Name: [name] MRN:  [id number]   Date: 08/24/2023 DOB:  28-Dec-1955 (68 y.o.)              Date of Birth:  19-Apr-1956  PCP: Mliss Sax, DO  Referring:  No ref. provider found     HPI:  Jeremy Sexton is a 68 y.o. White male who returns***         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      No outpatient medications have been marked as taking for the 08/24/23 encounter (Appointment) with Esmond Plants, MD.      Allergies   Allergen Reactions    Denton Brick Nuts] Rash and Itching    Tetracyclines Rash and Itching           There were no vitals taken for this visit.         General: appropriate for age. in no acute distress.    Vital signs are present above and have been reviewed by me     HEENT: Atraumatic, Normocephalic.    Lungs: Nonlabored breathing with symmetric expansion    Heart:Regular wth respect to rate.    Abdomen:Soft. Nontender. Nondistended     Psychiatric: Alert and oriented to person, place, and time. affect appropriate       Assessment/Plan:  No diagnosis found.     ***      This note was partially created using voice recognition software and is inherently subject to errors including those of syntax and "sound alike " substitutions which may escape proof reading. In such instances, original meaning may be extrapolated by contextual derivation.    Maura Crandall MD MBA CPE FACS

## 2023-08-24 NOTE — Nursing Note (Signed)
Wet to dry dressing applied tolerated well. Ma Rings, LPN  1/61/0960 45:40

## 2023-08-25 ENCOUNTER — Encounter (INDEPENDENT_AMBULATORY_CARE_PROVIDER_SITE_OTHER): Payer: Self-pay | Admitting: Surgery

## 2023-09-14 ENCOUNTER — Ambulatory Visit (INDEPENDENT_AMBULATORY_CARE_PROVIDER_SITE_OTHER): Payer: MEDICARE | Admitting: Surgery

## 2023-09-14 ENCOUNTER — Encounter (INDEPENDENT_AMBULATORY_CARE_PROVIDER_SITE_OTHER): Payer: Self-pay | Admitting: Surgery

## 2023-09-14 ENCOUNTER — Other Ambulatory Visit: Payer: Self-pay

## 2023-09-14 VITALS — BP 150/62 | HR 58 | Temp 98.1°F | Ht 72.0 in | Wt 207.0 lb

## 2023-09-14 DIAGNOSIS — E08621 Diabetes mellitus due to underlying condition with foot ulcer: Secondary | ICD-10-CM

## 2023-09-14 DIAGNOSIS — L97519 Non-pressure chronic ulcer of other part of right foot with unspecified severity: Secondary | ICD-10-CM

## 2023-09-14 MED ORDER — COLLAGENASE CLOSTRIDIUM HISTOLYTICUM 250 UNIT/GRAM TOPICAL OINTMENT
TOPICAL_OINTMENT | Freq: Every day | CUTANEOUS | 1 refills | Status: DC
Start: 2023-09-14 — End: 2023-10-26

## 2023-09-14 NOTE — Nursing Note (Signed)
Wet to dry dressing applied. Ma Rings, LPN  1/61/0960 45:40

## 2023-09-14 NOTE — Progress Notes (Signed)
GENERAL SURGERY, [facility] MEDICAL GROUP GENERAL SURGERY  [address]  [place] [address]    Progress Note    Name: Jeremy Sexton MRN:  [id number]   Date: 09/14/2023 DOB:  November 16, 1955 (68 y.o.)              Date of Birth:  09-Jun-1956  PCP: Jeremy Sax, DO  Referring:  No ref. provider found     HPI:  Jeremy Sexton is a 68 y.o. White male who returns repeat evaluation of his right foot.  States he did not get the prescription for Santyl.  Is continuing to do dressing changes.  He has no complaints.  No pain.  No fever.          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      Outpatient Medications Marked as Taking for the 09/14/23 encounter (Office Visit) with Esmond Plants, MD   Medication Sig    atenoloL (TENORMIN) 50 mg Oral Tablet Take 1 Tablet (50 mg total) by mouth Once a day    atorvastatin (LIPITOR) 20 mg Oral Tablet Take 1 Tablet (20 mg total) by mouth Every evening    cloNIDine HCL (CATAPRES) 0.1 mg Oral Tablet Take 1 Tablet (0.1 mg total) by mouth Twice daily    collagenase ointment (SANTYL) 250 unit/gram Ointment Apply topically Once a day    collagenase ointment (SANTYL) 250 unit/gram Ointment Apply topically Once a day    ergocalciferol, vitamin D2, (DRISDOL) 1,250 mcg (50,000 unit) Oral Capsule Take 1 Capsule (50,000 Units total) by mouth Every 7 days    famotidine (PEPCID) 40 mg Oral Tablet Take 1 Tablet (40 mg total) by mouth Once a day    furosemide (LASIX) 20 mg Oral Tablet Take 1 Tablet (20 mg total) by mouth Once a day    gabapentin (NEURONTIN) 400 mg Oral Capsule Take 1 Capsule (400 mg total) by mouth Three times a day     lisinopriL (PRINIVIL) 40 mg Oral Tablet Take 1 Tablet (40 mg total) by mouth Once a day    metFORMIN (GLUCOPHAGE) 500 mg Oral Tablet Take 1 Tablet (500 mg total) by mouth Twice daily with food    potassium chloride (K-DUR) 10 mEq Oral Tab Sust.Rel. Particle/Crystal Take 1 Tablet (10 mEq total) by mouth Once a day    traZODone (DESYREL) 50 mg Oral Tablet Take 1 Tablet (50 mg total) by mouth Every night      Allergies   Allergen Reactions    Cashew [Tree Nuts] Rash and Itching    Tetracyclines Rash and Itching           BP (!) 150/62   Pulse 58   Temp 36.7 C (98.1 F)   Ht 1.829 m (6')   Wt 93.9 kg (207 lb)   SpO2 98%   BMI 28.07 kg/m  General: appropriate for age. in no acute distress.    Vital signs are present above and have been reviewed by me     Right foot continues to improve.  Good granulation tissue with now only a 2.5 cm circular opening remaining.  Some surrounding callus.  No erythema.  The degree of soft tissue swelling is improved    Assessment/Plan:  Assessment/Plan   1. Diabetic ulcer of other part of right foot associated with diabetes mellitus due to underlying condition, unspecified ulcer stage (CMS HCC)         Try again to send Santyl to his pharmacy.  Recheck 3 weeks.  Continues to improve      This note was partially created using voice recognition software and is inherently subject to errors including those of syntax and "sound alike " substitutions which may escape proof reading. In such instances, original meaning may be extrapolated by contextual derivation.    Maura Crandall MD MBA CPE FACS

## 2023-10-10 NOTE — Progress Notes (Unsigned)
 GENERAL SURGERY, [facility] MEDICAL GROUP GENERAL SURGERY  [address]  [place] [address]    Progress Note    Name: TRAMAIN GERSHMAN MRN:  [id number]   Date: 10/12/2023 DOB:  04-20-1956 (68 y.o.)              Date of Birth:  12-31-1955  PCP: Mliss Sax, DO  Referring:  Mliss Sax     HPI:  NAITHAN DELAGE is a 68 y.o. 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      Allergies   Allergen Reactions    Denton Brick Nuts] Rash and Itching    Tetracyclines Rash and Itching           There were no vitals taken for this visit.         General: appropriate for age. in no acute distress.    Vital signs are present above and have been reviewed by me     HEENT: Atraumatic, Normocephalic.    Lungs: Nonlabored breathing with symmetric expansion    Heart:Regular wth respect to rate.    Abdomen:Soft. Nontender. Nondistended     Right foot:      Psychiatric: Alert and oriented to person, place, and time. affect appropriate       Assessment/Plan:  Assessment/Plan   1. Diabetic ulcer of other part of right foot associated with diabetes mellitus due to underlying condition, unspecified ulcer stage (CMS HCC)         ***      This note was partially created using voice recognition software and is inherently subject to errors including those of syntax and "sound alike " substitutions which may escape proof reading. In such instances, original meaning may be extrapolated by contextual derivation.    Maura Crandall MD MBA CPE  FACS

## 2023-10-12 ENCOUNTER — Other Ambulatory Visit: Payer: Self-pay

## 2023-10-12 ENCOUNTER — Ambulatory Visit (INDEPENDENT_AMBULATORY_CARE_PROVIDER_SITE_OTHER): Payer: Self-pay | Admitting: Surgery

## 2023-10-12 ENCOUNTER — Encounter (INDEPENDENT_AMBULATORY_CARE_PROVIDER_SITE_OTHER): Payer: Self-pay | Admitting: Surgery

## 2023-10-12 DIAGNOSIS — L97519 Non-pressure chronic ulcer of other part of right foot with unspecified severity: Secondary | ICD-10-CM

## 2023-10-12 DIAGNOSIS — E08621 Diabetes mellitus due to underlying condition with foot ulcer: Secondary | ICD-10-CM

## 2023-10-12 NOTE — Procedures (Signed)
 GENERAL SURGERY, [facility] MEDICAL GROUP GENERAL SURGERY  [address]  [place] [address]    Procedure Note    Name: EARL LOSEE MRN:  [id number]   Date: 10/12/2023 DOB:  [birth year] (67 y.o.)         DEBRIDEMENT, SUB-Q TISSUE; UP TO 500 SQ CM (AMB ONLY)    Performed by: Esmond Plants, MD  Authorized by: Esmond Plants, MD    Time Out:      Immediately before the procedure, a time out was called:  Yes      Patient verified:  Yes      Procedure verified:  Yes      Site verified:  Yes  Procedure Details:      Number of sq cm debrided?:  8      Documentation:      Right foot prepped with Betadine.  Sharp excision with scissors was used to remove the full-thickness prolapsed callus which would involve full-thickness skin down to some mild devitalized subcutaneous tissue.  The majority of the portion that was removed involved debriding of the skin/callus.  The overall area of the wound was 2 x 4 cm.  Tolerated without difficulty.      Esmond Plants, MD

## 2023-10-25 NOTE — Progress Notes (Signed)
 GENERAL SURGERY, [facility] MEDICAL GROUP GENERAL SURGERY  [address]  [place] [address]    Progress Note    Name: Jeremy Sexton MRN:  [id number]   Date: 10/26/2023 DOB:  [birth year] (68 y.o.)              Date of Birth:  31-May-1956  PCP: Jeremy Kansas, DO  Referring:  No ref. provider found     HPI:  Jeremy Sexton is a 68 y.o. White male who returns for evaluation of his right foot.  He has no complaints at the current time.  Has been continuing the use wet-to-dry dressing changes on this.  No pain.  No fever.  No significant drainage.          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 in no acute distress.    Vital signs are present above and have been reviewed by me     HEENT: Atraumatic, Normocephalic.    Lungs: Nonlabored breathing with symmetric expansion    Heart:Regular wth respect to rate.    Abdomen:Soft. Nontender. Nondistended     Right foot with only very small openings at this point.  Almost complete healing of the wound.  Some minimal callus formation but that has actually improved as well.    Psychiatric: Alert and oriented to person, place, and time. affect appropriate        Assessment/Plan:  Assessment/Plan   1. Diabetic ulcer of other part of right foot associated with diabetes mellitus due to underlying condition, unspecified ulcer stage (CMS HCC)         Wound is too small to proceed with wet-to-dry dressing changes at this point.  Will transition to Santyl  ointment to the small openings that remain.  Recheck 3-4 weeks.      This note was partially created using voice recognition software and is inherently subject to errors including those of syntax and "sound alike " substitutions which may escape proof reading. In such instances, original meaning may be extrapolated by contextual derivation.    Alica Inks MD MBA CPE FACS

## 2023-10-26 ENCOUNTER — Encounter (INDEPENDENT_AMBULATORY_CARE_PROVIDER_SITE_OTHER): Payer: Self-pay | Admitting: Surgery

## 2023-10-26 ENCOUNTER — Ambulatory Visit (INDEPENDENT_AMBULATORY_CARE_PROVIDER_SITE_OTHER): Payer: MEDICARE | Admitting: Surgery

## 2023-10-26 ENCOUNTER — Other Ambulatory Visit: Payer: Self-pay

## 2023-10-26 VITALS — BP 119/62 | HR 57 | Temp 97.5°F | Wt 205.0 lb

## 2023-10-26 DIAGNOSIS — E08621 Diabetes mellitus due to underlying condition with foot ulcer: Secondary | ICD-10-CM

## 2023-10-26 DIAGNOSIS — L97519 Non-pressure chronic ulcer of other part of right foot with unspecified severity: Secondary | ICD-10-CM

## 2023-10-26 MED ORDER — COLLAGENASE CLOSTRIDIUM HISTOLYTICUM 250 UNIT/GRAM TOPICAL OINTMENT
TOPICAL_OINTMENT | Freq: Every day | CUTANEOUS | 0 refills | Status: DC
Start: 2023-10-26 — End: 2023-11-30

## 2023-10-26 NOTE — Nursing Note (Signed)
 TAO dressing applied to right foot. Manya Sells, LPN  1/61/0960 45:40

## 2023-10-31 ENCOUNTER — Encounter (INDEPENDENT_AMBULATORY_CARE_PROVIDER_SITE_OTHER): Payer: Self-pay | Admitting: Surgery

## 2023-11-08 NOTE — Progress Notes (Signed)
 GENERAL SURGERY, [facility] MEDICAL GROUP GENERAL SURGERY  [address]  [name] [address]    Progress Note    Name: PRESS CASALE MRN:  [id number]   Date: 11/09/2023 DOB:  1956/06/29 (68 y.o.)              Date of Birth:  [birth year]  PCP: Jeremy Kansas, DO  Referring:  No ref. provider found     HPI:  Jeremy Sexton is a 68 y.o. White male who returns for evaluation of his right foot wound.  Was unable to get the Santyl  secondary to the significant cost that occurred outside of his insurance coverage.  He has no complaints at the current time.  His ambulatory with normal footwear.  He has no fever.  No pain.  No significant drainage.  Is placing topical antibiotic ointment on the small residual opening.          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      Allergies   Allergen Reactions    Jeani Mill Nuts] Rash and Itching    Tetracyclines Rash and Itching           BP 133/63   Pulse 65   Temp 36.6 C (97.9 F)   Wt 91.2 kg (201 lb)   SpO2 98%   BMI 27.26 kg/m          General: appropriate for age. in no acute distress.    Vital signs are present above and have been reviewed by me     HEENT: Atraumatic, Normocephalic.    Lungs: Nonlabored breathing with symmetric expansion    Heart:Regular wth respect to rate.    Abdomen:Soft. Nontender. Nondistended     Right foot:  Remains with a very small 1 cm circular opening which is superficial.  There was a fairly significant callus formation on the more lateral aspect of the  forefoot.    Psychiatric: Alert and oriented to person, place, and time. affect appropriate       Assessment/Plan:  Assessment/Plan   1. Diabetic ulcer of other part of right foot associated with diabetes mellitus due to underlying condition, unspecified ulcer stage (CMS HCC)         Discuss the possibility of debridement of the callus with him.  He was agreeable to that.  Tolerated without difficulty.  Continue local wound care.  Recheck 3-4 weeks.      This note was partially created using voice recognition software and is inherently subject to errors including those of syntax and "sound alike " substitutions which may escape proof reading. In such instances, original meaning may be extrapolated by contextual derivation.    Alica Inks MD MBA CPE FACS

## 2023-11-09 ENCOUNTER — Ambulatory Visit (INDEPENDENT_AMBULATORY_CARE_PROVIDER_SITE_OTHER): Payer: MEDICARE | Admitting: Surgery

## 2023-11-09 ENCOUNTER — Encounter (INDEPENDENT_AMBULATORY_CARE_PROVIDER_SITE_OTHER): Payer: Self-pay | Admitting: Surgery

## 2023-11-09 ENCOUNTER — Other Ambulatory Visit: Payer: Self-pay

## 2023-11-09 VITALS — BP 133/63 | HR 65 | Temp 97.9°F | Wt 201.0 lb

## 2023-11-09 DIAGNOSIS — E08621 Diabetes mellitus due to underlying condition with foot ulcer: Secondary | ICD-10-CM

## 2023-11-09 DIAGNOSIS — L97519 Non-pressure chronic ulcer of other part of right foot with unspecified severity: Secondary | ICD-10-CM

## 2023-11-13 ENCOUNTER — Encounter (INDEPENDENT_AMBULATORY_CARE_PROVIDER_SITE_OTHER): Payer: Self-pay | Admitting: Surgery

## 2023-11-13 NOTE — Procedures (Signed)
 GENERAL SURGERY, [facility] MEDICAL GROUP GENERAL SURGERY  [address]  [place] [address]    Procedure Note    Name: Jeremy Sexton MRN:  [id number]   Date: 11/09/2023 DOB:  May 27, 1956 (67 y.o.)         DEBRIDEMENT, SUB-Q TISSUE; UP TO 500 SQ CM (AMB ONLY)    Performed by: Verl Glatter, MD  Authorized by: Verl Glatter, MD    Time Out:      Immediately before the procedure, a time out was called:  Yes      Patient verified:  Yes      Procedure verified:  Yes      Site verified:  Yes  Procedure Details:      Number of sq cm debrided?:  6      Documentation:      The full-thickness skin/callus was sharply excised back without difficulty.  Scalpel/scissors used for this maneuver.  Overall size of the debrided area was 3 x 2 cm.  Tolerated without difficulty      Verl Glatter, MD

## 2023-11-16 ENCOUNTER — Telehealth (INDEPENDENT_AMBULATORY_CARE_PROVIDER_SITE_OTHER): Payer: Self-pay | Admitting: INTERVENTIONAL CARDIOLOGY

## 2023-11-16 NOTE — Telephone Encounter (Signed)
 Pt called and scheduled LHC for May 5 at [facility]. Cath lab will call day before to let him know what time to be there, also needs a driver. Pt voiced understanding of all above info.

## 2023-11-30 ENCOUNTER — Encounter (HOSPITAL_COMMUNITY): Payer: Self-pay | Admitting: INTERVENTIONAL CARDIOLOGY

## 2023-11-30 ENCOUNTER — Ambulatory Visit
Admission: RE | Admit: 2023-11-30 | Discharge: 2023-11-30 | Disposition: A | Discharge status: Home / Self Care | Payer: MEDICARE | Source: Ambulatory Visit | Attending: INTERVENTIONAL CARDIOLOGY | Admitting: INTERVENTIONAL CARDIOLOGY

## 2023-11-30 ENCOUNTER — Encounter (HOSPITAL_COMMUNITY)
Admission: RE | Disposition: A | Discharge status: Home / Self Care | Payer: Self-pay | Source: Ambulatory Visit | Attending: INTERVENTIONAL CARDIOLOGY

## 2023-11-30 ENCOUNTER — Other Ambulatory Visit: Payer: Self-pay

## 2023-11-30 ENCOUNTER — Encounter (INDEPENDENT_AMBULATORY_CARE_PROVIDER_SITE_OTHER): Payer: Self-pay | Admitting: Surgery

## 2023-11-30 DIAGNOSIS — I10 Essential (primary) hypertension: Secondary | ICD-10-CM

## 2023-11-30 DIAGNOSIS — I25119 Atherosclerotic heart disease of native coronary artery with unspecified angina pectoris: Secondary | ICD-10-CM | POA: Insufficient documentation

## 2023-11-30 DIAGNOSIS — R9439 Abnormal result of other cardiovascular function study: Secondary | ICD-10-CM | POA: Insufficient documentation

## 2023-11-30 DIAGNOSIS — E785 Hyperlipidemia, unspecified: Secondary | ICD-10-CM | POA: Insufficient documentation

## 2023-11-30 DIAGNOSIS — I2582 Chronic total occlusion of coronary artery: Secondary | ICD-10-CM | POA: Insufficient documentation

## 2023-11-30 DIAGNOSIS — I110 Hypertensive heart disease with heart failure: Secondary | ICD-10-CM | POA: Insufficient documentation

## 2023-11-30 DIAGNOSIS — I1A0 Resistant hypertension: Secondary | ICD-10-CM | POA: Insufficient documentation

## 2023-11-30 DIAGNOSIS — I2510 Atherosclerotic heart disease of native coronary artery without angina pectoris: Secondary | ICD-10-CM

## 2023-11-30 DIAGNOSIS — N049 Nephrotic syndrome with unspecified morphologic changes: Secondary | ICD-10-CM | POA: Insufficient documentation

## 2023-11-30 DIAGNOSIS — E119 Type 2 diabetes mellitus without complications: Secondary | ICD-10-CM | POA: Insufficient documentation

## 2023-11-30 DIAGNOSIS — I5042 Chronic combined systolic (congestive) and diastolic (congestive) heart failure: Secondary | ICD-10-CM | POA: Insufficient documentation

## 2023-11-30 DIAGNOSIS — I420 Dilated cardiomyopathy: Secondary | ICD-10-CM | POA: Insufficient documentation

## 2023-11-30 DIAGNOSIS — I3139 Other pericardial effusion (noninflammatory): Secondary | ICD-10-CM | POA: Insufficient documentation

## 2023-11-30 DIAGNOSIS — Z7984 Long term (current) use of oral hypoglycemic drugs: Secondary | ICD-10-CM | POA: Insufficient documentation

## 2023-11-30 LAB — COMPREHENSIVE METABOLIC PANEL, NON-FASTING
ALBUMIN/GLOBULIN RATIO: 1.3 (ref 0.8–1.4)
ALBUMIN: 4 g/dL (ref 3.5–5.7)
ALKALINE PHOSPHATASE: 111 U/L — ABNORMAL HIGH (ref 34–104)
ALT (SGPT): 18 U/L (ref 7–52)
ANION GAP: 7 mmol/L (ref 4–13)
AST (SGOT): 16 U/L (ref 13–39)
BILIRUBIN TOTAL: 0.6 mg/dL (ref 0.3–1.0)
BUN/CREA RATIO: 24 — ABNORMAL HIGH (ref 6–22)
BUN: 25 mg/dL (ref 7–25)
CALCIUM, CORRECTED: 9.8 mg/dL (ref 8.9–10.8)
CALCIUM: 9.8 mg/dL (ref 8.6–10.3)
CHLORIDE: 107 mmol/L (ref 98–107)
CO2 TOTAL: 21 mmol/L (ref 21–31)
CREATININE: 1.04 mg/dL (ref 0.60–1.30)
ESTIMATED GFR: 79 mL/min/{1.73_m2} (ref >59)
GLOBULIN: 3 (ref 2.0–3.5)
GLUCOSE: 163 mg/dL — ABNORMAL HIGH (ref 74–109)
OSMOLALITY, CALCULATED: 278 mosm/kg (ref 270–290)
POTASSIUM: 5 mmol/L (ref 3.5–5.1)
PROTEIN TOTAL: 7 g/dL (ref 6.4–8.9)
SODIUM: 135 mmol/L — ABNORMAL LOW (ref 136–145)

## 2023-11-30 LAB — CBC WITH DIFF
BASOPHIL #: 0.1 10*3/uL (ref 0.00–0.10)
BASOPHIL %: 1 % (ref 0–1)
EOSINOPHIL #: 0.2 10*3/uL (ref 0.00–0.50)
EOSINOPHIL %: 3 % (ref 1–8)
HCT: 33.1 % — ABNORMAL LOW (ref 36.7–47.1)
HGB: 11.3 g/dL — ABNORMAL LOW (ref 12.5–16.3)
LYMPHOCYTE #: 1.1 10*3/uL (ref 1.00–3.20)
LYMPHOCYTE %: 14 % — ABNORMAL LOW (ref 15–43)
MCH: 26.7 pg (ref 23.8–33.4)
MCHC: 34.1 g/dL (ref 32.5–36.3)
MCV: 78.2 fL (ref 73.0–96.2)
MONOCYTE #: 0.7 10*3/uL (ref 0.30–1.10)
MONOCYTE %: 8 % (ref 6–14)
MPV: 8.4 fL (ref 7.4–11.4)
NEUTROPHIL #: 6 10*3/uL (ref 1.70–7.60)
NEUTROPHIL %: 74 % (ref 44–74)
PLATELETS: 176 10*3/uL (ref 140–440)
RBC: 4.23 10*6/uL (ref 4.06–5.63)
RDW: 17.3 % — ABNORMAL HIGH (ref 12.1–16.2)
WBC: 8.1 10*3/uL (ref 3.6–10.2)

## 2023-11-30 LAB — PT/INR
INR: 1.14 — ABNORMAL HIGH (ref 0.84–1.10)
PROTHROMBIN TIME: 12.9 s — ABNORMAL HIGH (ref 9.8–12.7)

## 2023-11-30 LAB — MAGNESIUM: MAGNESIUM: 1.8 mg/dL — ABNORMAL LOW (ref 1.9–2.7)

## 2023-11-30 LAB — INVASIVE CARDIOLOGY PROCEDURE: Cath EF Quantitative: 50 %

## 2023-11-30 LAB — PTT (PARTIAL THROMBOPLASTIN TIME): APTT: 33.6 s (ref 25.0–38.0)

## 2023-11-30 SURGERY — CORONARY ANGIOGRAPHY W/LEFT HEART CATH W/WO LVG
Anesthesia: IV Sedation (Nurse Monitored)

## 2023-11-30 MED ORDER — HEPARIN (PORCINE) 1,000 UNIT/ML INJECTION SOLUTION
INTRAMUSCULAR | Status: AC
Start: 2023-11-30 — End: 2023-11-30
  Filled 2023-11-30: qty 10

## 2023-11-30 MED ORDER — NITROGLYCERIN 100 MCG/ML IN NS INJECTION
INJECTION | INTRAVENOUS | Status: AC
Start: 2023-11-30 — End: 2023-11-30
  Filled 2023-11-30: qty 50

## 2023-11-30 MED ORDER — DIPHENHYDRAMINE 25 MG CAPSULE
50.0000 mg | ORAL_CAPSULE | ORAL | Status: AC
Start: 2023-11-30 — End: 2023-11-30
  Administered 2023-11-30: 50 mg via ORAL

## 2023-11-30 MED ORDER — FENTANYL (PF) 50 MCG/ML INJECTION WRAPPER
INJECTION | Freq: Once | INTRAMUSCULAR | Status: DC | PRN
Start: 2023-11-30 — End: 2023-11-30
  Administered 2023-11-30 (×4): 25 ug via INTRAVENOUS

## 2023-11-30 MED ORDER — ASPIRIN 81 MG CHEWABLE TABLET
324.0000 mg | CHEWABLE_TABLET | Freq: Once | ORAL | Status: AC
Start: 2023-11-30 — End: 2023-11-30
  Administered 2023-11-30: 324 mg via ORAL

## 2023-11-30 MED ORDER — VERAPAMIL 2.5 MG/ML INTRAVENOUS SOLUTION
Freq: Once | INTRAVENOUS | Status: DC | PRN
Start: 2023-11-30 — End: 2023-11-30
  Administered 2023-11-30: 5 mg via INTRA_ARTERIAL

## 2023-11-30 MED ORDER — BIVALIRUDIN 250 MG INTRAVENOUS POWDER FOR SOLUTION
INTRAVENOUS | Status: AC
Start: 2023-11-30 — End: 2023-11-30
  Filled 2023-11-30: qty 5

## 2023-11-30 MED ORDER — NITROGLYCERIN 100 MCG/ML IN NS INJECTION
INJECTION | Freq: Once | INTRAVENOUS | Status: DC | PRN
Start: 2023-11-30 — End: 2023-11-30
  Administered 2023-11-30: 200 ug via INTRA_ARTERIAL

## 2023-11-30 MED ORDER — CARVEDILOL 25 MG TABLET
50.0000 mg | ORAL_TABLET | Freq: Two times a day (BID) | ORAL | 0 refills | Status: AC
Start: 2023-11-30 — End: ?

## 2023-11-30 MED ORDER — ACETAMINOPHEN 325 MG TABLET
975.0000 mg | ORAL_TABLET | Freq: Once | ORAL | Status: DC | PRN
Start: 2023-11-30 — End: 2023-11-30

## 2023-11-30 MED ORDER — ASPIRIN 81 MG CHEWABLE TABLET
CHEWABLE_TABLET | ORAL | Status: AC
Start: 2023-11-30 — End: 2023-11-30
  Filled 2023-11-30: qty 4

## 2023-11-30 MED ORDER — FENTANYL (PF) 50 MCG/ML INJECTION SOLUTION
INTRAMUSCULAR | Status: AC
Start: 2023-11-30 — End: 2023-11-30
  Filled 2023-11-30: qty 2

## 2023-11-30 MED ORDER — DIPHENHYDRAMINE 25 MG CAPSULE
ORAL_CAPSULE | ORAL | Status: AC
Start: 2023-11-30 — End: 2023-11-30
  Filled 2023-11-30: qty 1

## 2023-11-30 MED ORDER — ATORVASTATIN 40 MG TABLET
40.0000 mg | ORAL_TABLET | Freq: Every evening | ORAL | 0 refills | Status: AC
Start: 2023-11-30 — End: ?

## 2023-11-30 MED ORDER — DIAZEPAM 5 MG TABLET
ORAL_TABLET | ORAL | Status: AC
Start: 2023-11-30 — End: 2023-11-30
  Filled 2023-11-30: qty 1

## 2023-11-30 MED ORDER — HEPARIN (PORCINE) 1,000 UNIT/ML INJECTION SOLUTION
Freq: Once | INTRAMUSCULAR | Status: DC | PRN
Start: 2023-11-30 — End: 2023-11-30
  Administered 2023-11-30: 4300 [IU] via INTRAVENOUS

## 2023-11-30 MED ORDER — LIDOCAINE HCL 10 MG/ML (1 %) INJECTION SOLUTION
INTRAMUSCULAR | Status: AC
Start: 2023-11-30 — End: 2023-11-30
  Filled 2023-11-30: qty 50

## 2023-11-30 MED ORDER — SODIUM CHLORIDE 0.9 % INTRAVENOUS SOLUTION
INTRAVENOUS | Status: DC
Start: 2023-11-30 — End: 2023-11-30

## 2023-11-30 MED ORDER — VERAPAMIL 2.5 MG/ML INTRAVENOUS SOLUTION
INTRAVENOUS | Status: AC
Start: 2023-11-30 — End: 2023-11-30
  Filled 2023-11-30: qty 2

## 2023-11-30 MED ORDER — IOHEXOL 350 MG IODINE/ML INTRAVENOUS SOLUTION
Freq: Once | INTRAVENOUS | Status: DC | PRN
Start: 2023-11-30 — End: 2023-11-30
  Administered 2023-11-30: 50 mL via INTRA_ARTERIAL

## 2023-11-30 MED ORDER — LISINOPRIL 20 MG TABLET
20.0000 mg | ORAL_TABLET | Freq: Every day | ORAL | 0 refills | Status: AC
Start: 2023-11-30 — End: ?

## 2023-11-30 MED ORDER — LIDOCAINE HCL 10 MG/ML (1 %) INJECTION SOLUTION
Freq: Once | INTRAMUSCULAR | Status: DC | PRN
Start: 2023-11-30 — End: 2023-11-30
  Administered 2023-11-30: 5 mL via INTRADERMAL

## 2023-11-30 MED ORDER — DIAZEPAM 5 MG TABLET
5.0000 mg | ORAL_TABLET | ORAL | Status: AC
Start: 2023-11-30 — End: 2023-11-30
  Administered 2023-11-30: 5 mg via ORAL

## 2023-11-30 MED ORDER — SODIUM CHLORIDE 0.9 % INTRAVENOUS SOLUTION
INTRAVENOUS | Status: DC | PRN
Start: 2023-11-30 — End: 2023-11-30
  Administered 2023-11-30: 50 mL/h via INTRAVENOUS
  Administered 2023-11-30: 0 via INTRAVENOUS

## 2023-11-30 MED ORDER — MIDAZOLAM 5 MG/ML INJECTION WRAPPER
Freq: Once | INTRAMUSCULAR | Status: DC | PRN
Start: 2023-11-30 — End: 2023-11-30
  Administered 2023-11-30 (×5): 1 mg via INTRAVENOUS

## 2023-11-30 MED ORDER — HEPARIN (PORCINE) (PF) 2,000 UNIT/1,000 ML IN 0.9 % SODIUM CHLORIDE IV
INTRAVENOUS | Status: AC
Start: 2023-11-30 — End: 2023-11-30
  Filled 2023-11-30: qty 2000

## 2023-11-30 MED ORDER — MIDAZOLAM 5 MG/ML INJECTION WRAPPER
INTRAMUSCULAR | Status: AC
Start: 2023-11-30 — End: 2023-11-30
  Filled 2023-11-30: qty 1

## 2023-11-30 SURGICAL SUPPLY — 13 items
CATH ANGIO 5FR PGTL ST CURVE 110CM PERFORMA MIV 6 SH RADOPQ BRD RADIAL PEBAX PLYCRB NYL STRL LF (CATHETERS) ×2 IMPLANT
CATH ANGIO 5FR RADIAL TIG 4 CURVE 100CM OPTITORQUE LRG LUM SH 2 BRD SFT TIP COR SS NYL POLYUR (CATHETERS) ×2 IMPLANT
COVER PROBE 58X5.5IN TELESCOPICAL FOLD ELAS BAND GEL PKT STRL LF  CIV-FLX (MED SURG SUPPLIES) ×2 IMPLANT
DCNTR FLUID DISPENSR BAG BAJ DISP STRL LF  ASPT TRANSF (IV TUBING & ACCESSORIES) ×2 IMPLANT
DEVICE COMPRESS TR BAND 24CM 2 BAL TRNSPR ADJ STRAP REG RADIAL ART (MED SURG SUPPLIES) ×2 IMPLANT
GEL ELECTRODE PRPDZ RADIOLUCENT E M R SER DEFIBR (MED SURG SUPPLIES) ×2 IMPLANT
GLOVE SURG 8 LF  PF BEAD CUF STRL CRM 11.8IN PROTEXIS PI PLISPRN THK9.1 MIL (GLOVES AND ACCESSORIES) ×4 IMPLANT
GW EMRLD .035IN 7CM 260CM FLXB END FIX COR EXCH PTFE VAS PERC 3MM RAD J CURVE DISP (WIRE) ×2 IMPLANT
GW GLDWR .035IN 3CM 180CM FLXB ATRAUMA TIP RADOPQ KINK RST NITINOL TUNG POLYUR HDRPH VAS 1.5MM J (WIRE) ×2 IMPLANT
KIT INTROD 10CM 6FR 22GA GLIDESHEATH SLNDR .021IN PLASTIC SHEATH DIL 2 WL PNCT SHORT ANG MINIWIRE 45 (INTRODUCER) ×2 IMPLANT
SCATPAD RAD SHIELD 145 X 165 (MED SURG SUPPLIES) ×2 IMPLANT
SET ADMN MERIT MEDICAL SYS INJ MED CNRST S DISP (VASCULAR) ×2 IMPLANT
TUBING PROCEDURE 72IN RADGR STRL DISP (MED SURG SUPPLIES) ×2 IMPLANT

## 2023-11-30 NOTE — Nurses Notes (Signed)
 Patient TR band removed at this time. No s/s of bleeding or hematoma note. Site cleaned and dressed with sterile 2x2 and op-site.     Patient IV removed intact at this time. No bleeding noted at site and pressure dressing applied.     Patient discharged from cath lab at this time. Patient left department with all personal belongings, including AVS. Patient assisted into passenger side of POV via wheelchair with cath lab staff. Discharge instructions reinforced prior to patient leaving cath lab and understanding verbalized. All questions answered.

## 2023-11-30 NOTE — H&P (Signed)
 [facility]   Interventional Cardiology Consult                                                                            Abdullah, Gravell, 68 y.o. male  Date of Admission:  11/30/2023  Date of Birth:  12/18/1955    11/30/2023    STOP: IF H&P IS GREATER THAN 30 DAYS FROM SURGICAL DAY COMPLETE NEW H&P IS REQUIRED.     H & P updated the day of the procedure.  1.  H&P completed within 30 days of surgical procedure and has been reviewed within 24 hours of admission but prior to surgery or a procedure requiring anesthesia services, the patient has been examined, and no change has occured in the patients condition since the H&P was completed.       Change in medications: No              Comments: see full H&P scanned in media    2.  Patient continues to be appropriate candidate for planned surgical procedure. YES    3. HPI: Patient referred by Dr. Pandora Bogaert for outpatient LHC. Patient reported increased fatigue and DOE. He had echo in January that showed EF 40% with LV and LA enlargement. He then underwent stress test 11-11-23 that showed a large septal area of ischemia with around 13% myocardium involvement. There was apical and septal hypokinesis. He has never had a heart cath. Does not smoke. PMH significant for HTN, HLD, DM2.     Jeris Montes, PA-C

## 2023-11-30 NOTE — Sedation Documentation (Signed)
 11/30/23  Procedure(s):  CORONARY ANGIOGRAPHY W/LEFT HEART CATH W/WO LVG  CARD US  GUIDED ACCESS    Diagnosis:     Sedation Informed Consent, pre-sedation risk assessment and evaluation completed.  History of previous adverse experiences with sedation/analgesia/anesthesia assessed.  Monitored conscious sedation was administered under my direct supervision by an appropriately trained sedation nurse.  Appropriate Facility and Equipment compliant.      Procedure time out  Timeouts       Carin Dysart, RN at [facility] Nov 30, 2023 1310 EDT       Timeout Details       Timeout type: Preprocedure              Procedures       Panel 1: CORONARY ANGIOGRAPHY W/LEFT HEART CATH W/WO LVG with Aseret Hoffman, Mara Seminole, MD    Panel 1: CARD US  GUIDED ACCESS with Ronte Parker, Mara Seminole, MD              Timeout Questions    Correct patient? Yes  Correct site? Yes  Correct side? N/A  Correct position? Yes  Correct procedure? Yes  Site marked? N/A  H&P note completed? Yes  Consents verified? Yes  Radiology studies available? Yes  Relevant lab results available? Yes  Allergies reviewed? Yes  Are all required blood products & devices for the procedure available? Yes  Is documentation verified? Yes             Staff Present       Physicians  Vanassa Penniman, Mara Seminole, MD Staff  Jonathan Neighbor, RN  Carin Garnet, RN  Lonn Roads, RT Stefan Edge, RN              Signing History       Staff Performed Signed    Carin [place], RN Mon Nov 30, 2023 1310 EDT Mon Nov 30, 2023 1310 EDT                            Physician in  and out times  Physicians       Name Panel Role Time Period    Omelia Marquart, Mara Seminole, MD Panel 1 Primary 11/30/2023 1241 - 11/30/2023 1338          Sedation Staff       Name Type Time Period    Jonathan Neighbor, RN Invasive Nurse 11/30/2023 1241 - 11/30/2023 1347            Sedation and Procedure Times:  Event Time In   CV Sedation Start 1301   CV Sedation Stop 1334          Proc Name Event Type Event Time    CORONARY ANGIOGRAPHY W/LEFT HEART CATH W/WO LVG  Incision  Start Mon Nov 30, 2023  1:14 PM    CORONARY ANGIOGRAPHY W/LEFT HEART CATH W/WO LVG  Incision Close Mon Nov 30, 2023  1:32 PM    CARD US  GUIDED ACCESS  Incision Start Mon Nov 30, 2023  1:14 PM    CARD US  GUIDED ACCESS  Incision Close Mon Nov 30, 2023  1:32 PM            Aldrete Scores    Pre Sedation  Activity: 2-->able to move 4 extremities voluntarily or on command  Respiration: 2-->able to breathe and cough freely  Circulation: 2-->BP within 20% of pre-anesthetic level  Consciousness: 2-->fully awake  O2 Saturation: 2-->able to maintain O2 saturation greater than 92% on room air  Dressing: 2-->dry and clean or not applicable  Pain: 2-->pain free  Ambulation: 2-->able to stand up and walk straight, on ordered bedrest, or performing at previous level of functioning  Fasting/Feeding: 2-->able to drink fluids or NPO, minimal nausea/ no vomiting  Urine Output: 2-->has voided, adequate urine output per device, or not applicable  Modified Aldrete Score: 20      Post Sedation  Assessment Scored:: Post-Procedure  Activity: 2-->able to move 4 extremities voluntarily or on command  Respiration: 2-->able to breathe and cough freely  Circulation: 2-->BP within 20% of pre-anesthetic level  Consciousness: 2-->fully awake  O2 Saturation: 2-->able to maintain O2 saturation greater than 92% on room air  Dressing: 2-->dry and clean or not applicable  Pain: 2-->pain free  Ambulation: 2-->able to stand up and walk straight, on ordered bedrest, or performing at previous level of functioning  Urine Output: 2-->has voided, adequate urine output per device, or not applicable  Post Modified Aldrete Score: 20      Sedation Type: Moderate Sedation     Medications (moderate): Fentanyl , Versed      Unit: [facility] Invasive Lab  IV Type: Peripheral IV        Effects of Administration: Successful sedation w/o adverse events       Patient was continuously monitored throughout the procedure.  Provider was in attendance throughout sedation.  See Invasive  Procedure Log for additional details.    Lenice Quill, MD       Blood loss: minimal  Samples: N/A  Complications: none

## 2023-12-01 ENCOUNTER — Other Ambulatory Visit (INDEPENDENT_AMBULATORY_CARE_PROVIDER_SITE_OTHER): Payer: Self-pay | Admitting: INTERVENTIONAL CARDIOLOGY

## 2023-12-01 DIAGNOSIS — I5042 Chronic combined systolic (congestive) and diastolic (congestive) heart failure: Secondary | ICD-10-CM

## 2023-12-01 DIAGNOSIS — R931 Abnormal findings on diagnostic imaging of heart and coronary circulation: Secondary | ICD-10-CM

## 2023-12-01 LAB — ECG 12 LEAD
Atrial Rate: 87 {beats}/min
Calculated P Axis: 69 degrees
Calculated R Axis: 70 degrees
Calculated T Axis: 79 degrees
PR Interval: 200 ms
QRS Duration: 84 ms
QT Interval: 348 ms
QTC Calculation: 418 ms
Ventricular rate: 87 {beats}/min

## 2023-12-09 ENCOUNTER — Other Ambulatory Visit (HOSPITAL_BASED_OUTPATIENT_CLINIC_OR_DEPARTMENT_OTHER): Payer: Self-pay | Admitting: THORACIC SURGERY CARDIOTHORACIC VASCULAR SURGERY

## 2023-12-09 ENCOUNTER — Other Ambulatory Visit: Payer: Self-pay

## 2023-12-09 ENCOUNTER — Ambulatory Visit: Payer: MEDICARE | Attending: FAMILY PRACTICE | Admitting: THORACIC SURGERY CARDIOTHORACIC VASCULAR SURGERY

## 2023-12-09 ENCOUNTER — Telehealth (HOSPITAL_COMMUNITY): Payer: Self-pay

## 2023-12-09 ENCOUNTER — Other Ambulatory Visit (HOSPITAL_COMMUNITY): Payer: Self-pay

## 2023-12-09 ENCOUNTER — Ambulatory Visit (HOSPITAL_BASED_OUTPATIENT_CLINIC_OR_DEPARTMENT_OTHER): Payer: MEDICARE

## 2023-12-09 ENCOUNTER — Ambulatory Visit (HOSPITAL_BASED_OUTPATIENT_CLINIC_OR_DEPARTMENT_OTHER)
Admission: RE | Admit: 2023-12-09 | Discharge: 2023-12-09 | Disposition: A | Discharge status: Home / Self Care | Payer: MEDICARE | Source: Ambulatory Visit | Attending: FAMILY PRACTICE

## 2023-12-09 ENCOUNTER — Encounter (HOSPITAL_BASED_OUTPATIENT_CLINIC_OR_DEPARTMENT_OTHER): Payer: Self-pay | Admitting: THORACIC SURGERY CARDIOTHORACIC VASCULAR SURGERY

## 2023-12-09 VITALS — BP 156/73 | HR 74 | Ht 72.0 in | Wt 190.0 lb

## 2023-12-09 DIAGNOSIS — I2510 Atherosclerotic heart disease of native coronary artery without angina pectoris: Secondary | ICD-10-CM | POA: Insufficient documentation

## 2023-12-09 DIAGNOSIS — E119 Type 2 diabetes mellitus without complications: Secondary | ICD-10-CM | POA: Insufficient documentation

## 2023-12-09 DIAGNOSIS — I6523 Occlusion and stenosis of bilateral carotid arteries: Secondary | ICD-10-CM

## 2023-12-09 DIAGNOSIS — E785 Hyperlipidemia, unspecified: Secondary | ICD-10-CM | POA: Insufficient documentation

## 2023-12-09 DIAGNOSIS — I10 Essential (primary) hypertension: Secondary | ICD-10-CM | POA: Insufficient documentation

## 2023-12-09 DIAGNOSIS — Z01810 Encounter for preprocedural cardiovascular examination: Secondary | ICD-10-CM

## 2023-12-09 DIAGNOSIS — I739 Peripheral vascular disease, unspecified: Secondary | ICD-10-CM

## 2023-12-09 NOTE — Progress Notes (Signed)
 Cardiothoracic Surgery, Division [facility]  [address]  [place] [address]  [phone]      CARDIAC SURGERY DEPARTMENTH&P     Name: Jeremy Sexton   DOB: November 24, 1955  [68 y.o. male]   MRN: [id number]       Visit Date: 12/09/2023   Referring: No referring provider defined for this encounter.   PCP: Garry Kansas, DO       Chief Complaint: New Patient (CABG EVAL)      Primary Cardiologist: Dr. Pandora Bogaert    HPI: Jeremy Sexton is a 68 y.o., White male who presents today with an abnormal heart catheterization that was conducted by Dr. Author Board. The patient has had an increased shortness of breath and weakness/fatigue over the last months. He sees the cardiologist, Dr. Pandora Bogaert in [facility], and he ordered a stress test and echo on patient. Those results were abnormal which led to him being referred to Dr. 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 The patient has a history of HTN, HLD, T2D, and peripheral neuropathy. 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 Patient follows with the surgeon for care. He has no history of tobacco use or alcohol use. His father CAD history with a massive MI. Patient is completely independent with ADLs. Patient is referred here today for evaluation for CABG with Dr. Sheena Dennis.    Problem List          Cardiovascular System    Abnormal stress test    Hyperlipidemia    Hypertensive disorder       Endocrine    Type 2 diabetes mellitus    Diabetes mellitus       Musculoskeletal    Cellulitis of right foot    Low back pain       Dermatology    Cellulitis       ROS: Other than ROS in the HPI, all other systems were negative.      Information Obtained from patient    Past Medical History:   Diagnosis Date    Back problem     Diabetes mellitus, type 2     Esophageal reflux     High cholesterol     HTN (hypertension)     Peripheral neuropathy     Wears glasses      "Reading glasses"           Risk Factors:    Family history of premature CAD (male < 50 or male < 52): No  Diabetes Mellitus : Yes. Diabetes Therapy: Oral Meds  Dyslipidemia: Yes  Renal Disease: No  Hypertension: Yes  Chronic Lung Disease: No  No  Home oxygen:No  Sleep Apnea: No   Pneumonia: No  History of or current Depression: No  Liver disease: No. No  Immunocompromised at present (systemic steroids, chemo, anti-rejection meds): No  Mediastinal radiation: No  Cancer within 5 years (doesn't include basal cell or squamous cell CA): No  Peripheral artery disease (claudication, amputation, vascular reconstruction, aortic aneurysm. THIS DOES NOT include the carotid or cerebral vascular arteries or thoracic aneurysms): No    Thoracic Aorta Disease (history or current disease of the thoracic or thorcoabdominal aorta): No    Syncope (cardiac related within 1 year or surgery): No  Prior Cerebrovascular disease: No  History of previous carotid artery surgery and/or stenting: No  CSHA Clinical Frailty Scale: 2- Well: No active disease symptoms but  less than category 1.   Five Meter Walk/ Six Minute Walk Performed: No.  Functional Disability: None  Electrolyte Imbalance: No  Protein-Calorie Malnutrition: No  Coagulopathy: No  Current Sepsis: No  Contraindication for Perioperative Beta-Blocker: No    Cardiac Status:    Prior MI: No  CAD presentation: Dyspnea, fatigue, and weakness  Heart Failure: No      Cardiogenic Shock: No  Previous Arrhythmia : No  Prior Arrythmia surgery (MAZE or ablation): No  Prior CABG: No   Prior Valve Surgery: No  Prior PCI: No  Previous congenital: No  Previous ICD: No  Previous Pacemaker: No  Other previous cardiovascular intervention: No  Cardiomyopathy: No  Porcelain Aorta: No    Transcatheter Procedure: No    Allergies   Allergen Reactions    Cashew [Tree Nuts] Rash and Itching    Tetracyclines Rash and Itching    Bee Venom Protein (Honey Bee)        Current  Medications:  Current Outpatient  Medications   Medication Sig    amLODIPine (NORVASC) 10 mg Oral Tablet Take 1 Tablet (10 mg total) by mouth Daily    aspirin  (ECOTRIN) 81 mg Oral Tablet, Delayed Release (E.C.) Take 1 Tablet (81 mg total) by mouth Daily    atorvastatin  (LIPITOR) 40 mg Oral Tablet Take 1 Tablet (40 mg total) by mouth Every evening    carvediloL  (COREG ) 25 mg Oral Tablet Take 2 Tablets (50 mg total) by mouth Twice daily with food    cloNIDine  HCL (CATAPRES ) 0.1 mg Oral Tablet Take 1 Tablet (0.1 mg total) by mouth Twice daily    famotidine  (PEPCID ) 40 mg Oral Tablet Take 1 Tablet (40 mg total) by mouth Daily    furosemide  (LASIX ) 20 mg Oral Tablet Take 1 Tablet (20 mg total) by mouth Daily    gabapentin  (NEURONTIN ) 400 mg Oral Capsule Take 1 Capsule (400 mg total) by mouth Three times a day    Ibuprofen (MOTRIN) 200 mg Oral Tablet Take 1 Tablet (200 mg total) by mouth Four times a day as needed for Pain    lisinopriL  (PRINIVIL ) 20 mg Oral Tablet Take 1 Tablet (20 mg total) by mouth Daily    metFORMIN (GLUCOPHAGE) 500 mg Oral Tablet Take 1 Tablet (500 mg total) by mouth Twice daily with food    potassium chloride  (K-DUR) 10 mEq Oral Tab Sust.Rel. Particle/Crystal Take 1 Tablet (10 mEq total) by mouth Daily    traZODone  (DESYREL ) 50 mg Oral Tablet Take 1 Tablet (50 mg total) by mouth Every night       Past Surgical History:   Procedure Laterality Date    HX BACK SURGERY      HX TONSILLECTOMY      TOE AMPUTATION Right     great toe    TOE AMPUTATION Right     2nd and 3rd digit           Past Family History:   Father: CAD    Social History     Socioeconomic History    Marital status: Widowed   Tobacco Use    Smoking status: Never     Passive exposure: Never    Smokeless tobacco: Never   Vaping Use    Vaping status: Never Used   Substance and Sexual Activity    Alcohol use: Never     Comment: Previous, quit 1 year ago    Drug use: Never    Sexual activity: Not Currently   Other Topics  Concern    Ability to Walk 1 Flight of Steps without  SOB/CP Yes    Routine Exercise Yes    Ability to Walk 2 Flight of Steps without SOB/CP Yes    Unable to Ambulate No    Total Care No    Ability To Do Own ADL's Yes    Uses Walker No    Other Activity Level Yes    Uses Cane No     Social Determinants of Health     Social Connections: Medium Risk (11/30/2023)    Social Connections     SDOH Social Isolation: 3 to 5 times a week       Substance Use Screening and Counseling:  Counseling performed for  N/A  based on social history.    Exam:   BP (!) 156/73   Pulse 74   Ht 1.829 m (6')   Wt 86.2 kg (190 lb)   SpO2 99%   BMI 25.77 kg/m       Constitutional:  appears in good health, comfortable  Respiratory:  Breathing nonlabored, Clear to auscultation bilaterally.   Cardiovascular:  regular rate and rhythm, S1, S2 normal, no murmur, click, rub or gallop  Gastrointestinal:  non-distended, Soft, non-tender  Musculoskeletal:  Head atraumatic and normocephalic  Integumentary:  Skin warm and dry  Neurologic:  Grossly normal. Alert and oriented x3  Psychiatric:  Normal affect, behavior, memory, thought content, judgement, and speech.    Labs:  Lab Results   Component Value Date    SODIUM 135 (L) 11/30/2023    POTASSIUM 5.0 11/30/2023    CHLORIDE 107 11/30/2023    CO2 21 11/30/2023    ANIONGAP 7 11/30/2023    BUN 25 11/30/2023    CREATININE 1.04 11/30/2023    BUNCRRATIO 24 (H) 11/30/2023    GFR 79 11/30/2023    GLUCOSENF 163 (H) 11/30/2023       Lab Results   Component Value Date/Time    WBC 8.1 11/30/2023 09:00 AM    HGB 11.3 (L) 11/30/2023 09:00 AM    HCT 33.1 (L) 11/30/2023 09:00 AM    PLTCNT 176 11/30/2023 09:00 AM    RBC 4.23 11/30/2023 09:00 AM    MCV 78.2 11/30/2023 09:00 AM    MCHC 34.1 11/30/2023 09:00 AM    MCH 26.7 11/30/2023 09:00 AM    RDW 17.3 (H) 11/30/2023 09:00 AM    MPV 8.4 11/30/2023 09:00 AM         Radiology Tests:  Investigations/Diagnostic Findings:  Echo: Overall left ventricular systolic function is mild-moderately impaired with, an EF 40%. Mild,  distal amd apical segments of septal and anteroseptal walls are akinetic.   Cath: Diagnostic  Dominance: Right  Left Main   Dist LM lesion is 60% stenosed.      Left Anterior Descending   Mid LAD lesion is 99% stenosed. Chronic Total Occlusion is present.      First Diagonal Branch   1st Diag lesion is 80% stenosed.      Ramus Intermedius   Ramus lesion is 50% stenosed. ostial      Left Circumflex   Mid Cx lesion is 50% stenosed.      First Obtuse Marginal Branch   1st Mrg lesion is 50% stenosed.      Right Coronary Artery   Dist RCA lesion is 60% stenosed.      Right Posterior Descending Artery   RPDA lesion is 70% stenosed.  CXR: No results found for this or any previous visit (from the past 720 hours).    EKG:   Recent Results (from the past 720 hours)   ECG 12 LEAD    Collection Time: 11/30/23  9:07 AM   Result Value Ref Range    Ventricular rate 87 BPM    Atrial Rate 87 BPM    PR Interval 200 ms    QRS Duration 84 ms    QT Interval 348 ms    QTC Calculation 418 ms    Calculated P Axis 69 degrees    Calculated R Axis 70 degrees    Calculated T Axis 79 degrees               Additional Radiology ordered: Normal sinus rhythm    Images obtained from [facility] from 08/06/2023 reviewed as follows echo.          ASSESSMENT & PLAN:  67 year old male presenting today for CABG evaluation.    Patient is being evaluated for CABG by Dr. Sheena Dennis after abnormal heart cath. Dr. Sheena Dennis has reviewed all imaging and labs of the patient. Due to the chronically occulted LAD, Dr. Sheena Dennis feels that the next best step for the patient is a cardiac MRI at [facility] to evaluate the viability of the LAD. If the MRI shows viable myocardium, then Dr. Sheena Dennis recommends proceeding with CABG. If myocardium is not viable, then Dr. Sheena Dennis feels stents in the diagonal and PDA would be better option for the patient.     Patient Active Problem List    Diagnosis Date Noted    Abnormal stress test 11/30/2023    Cellulitis  01/20/2023    Cellulitis of right foot 01/20/2023    Type 2 diabetes mellitus 01/20/2023    Diabetes mellitus 01/15/2023    Hyperlipidemia 01/15/2023    Hypertensive disorder 01/15/2023    Low back pain 01/15/2023       Problem List Items Addressed This Visit    None    STS Risk Score [email or web address] : Calculation   Procedure Type: Isolated CABG   Perioperative Outcome Estimate %   Operative Mortality 0.925%   Morbidity & Mortality 4.69%   Stroke 0.632%   Renal Failure 0.708%   Reoperation 2.2%   Prolonged Ventilation 2.27%   Deep Sternal Wound Infection 0.118%   Long Hospital Stay (>14 days) 2.69%   Short Hospital Stay (<6 days)* 56.5%             Pre- Op Testing Considerations : PFTS/ ABG, TTE/ TEE, L/R Cath, and Carotid Duplex (>/=68 y/o)    yes Risks, benefits, and alternatives of surgery discussed with patient. The risks of surgery include infection, bleeding, stroke, MI, lung failure, arrhythmias, the possibility of additional surgery, and death.     PACES trial discussed: No          Millicent Ally, PA-C

## 2023-12-09 NOTE — Telephone Encounter (Signed)
 Patient will need scheduled for cardiac MRI at [facility] and follow up scheduled in office 3 weeks from now.

## 2023-12-09 NOTE — Telephone Encounter (Signed)
 Cardiac MRI scheduled at [facility] on 12/17/2023 at 1:00pm. Called patient to make aware patient requested a call back .

## 2023-12-10 NOTE — Telephone Encounter (Addendum)
 Patient notified of appointment date/time for cardiac MRI scheduled at [facility] on 05/22 at 1:00pm. Orders faxed & Fax confirmation received.

## 2023-12-11 NOTE — Telephone Encounter (Signed)
 Patient was rescheduled to 05/28 at 09:00am. Patient notified of new appointment date/time. Address given to patient for [facility]. Facility will call patient with instructions.

## 2023-12-24 ENCOUNTER — Other Ambulatory Visit (HOSPITAL_BASED_OUTPATIENT_CLINIC_OR_DEPARTMENT_OTHER): Payer: Self-pay | Admitting: VASCULAR SURGERY

## 2023-12-24 ENCOUNTER — Ambulatory Visit: Payer: MEDICARE | Attending: FAMILY PRACTICE | Admitting: VASCULAR SURGERY

## 2023-12-24 ENCOUNTER — Other Ambulatory Visit: Payer: Self-pay

## 2023-12-24 ENCOUNTER — Encounter (HOSPITAL_BASED_OUTPATIENT_CLINIC_OR_DEPARTMENT_OTHER): Payer: Self-pay | Admitting: VASCULAR SURGERY

## 2023-12-24 DIAGNOSIS — L97519 Non-pressure chronic ulcer of other part of right foot with unspecified severity: Secondary | ICD-10-CM

## 2023-12-24 DIAGNOSIS — I739 Peripheral vascular disease, unspecified: Secondary | ICD-10-CM | POA: Insufficient documentation

## 2023-12-24 DIAGNOSIS — Z7984 Long term (current) use of oral hypoglycemic drugs: Secondary | ICD-10-CM

## 2023-12-24 DIAGNOSIS — E11621 Type 2 diabetes mellitus with foot ulcer: Secondary | ICD-10-CM

## 2023-12-24 DIAGNOSIS — S91309A Unspecified open wound, unspecified foot, initial encounter: Secondary | ICD-10-CM

## 2023-12-24 DIAGNOSIS — Z89421 Acquired absence of other right toe(s): Secondary | ICD-10-CM

## 2023-12-24 DIAGNOSIS — I10 Essential (primary) hypertension: Secondary | ICD-10-CM

## 2023-12-24 NOTE — Progress Notes (Signed)
 This is an addendum to the office progress note from 12/09/2023.  As you recall patient was referred after having reduced ejection fraction of the proximally 40% and left heart catheterization showing chronic total occlusion of a likely relatively small left anterior descending coronary artery.  Since then patient was referred for cardiac MRI.  Since seeing patient in office patient underwent a cardiac MRI performed at [facility].  Per MRI patient has mostly viable myocardium.  Therefore it is felt that patient would benefit from surgical revascularization.  This would likely involve revascularization to the occluded left anterior descending coronary artery, the large diagonal coronary artery which runs to the apex, the obtuse marginal coronary artery, and the posterior descending coronary artery.  It is noted that patient is being seen by vascular surgery secondary to his chronic lower extremity wounds.  We will discuss timing of angiogram with the vascular surgery team.

## 2023-12-24 NOTE — Progress Notes (Signed)
 Vascular Surgery  New Patient Clinic Note                    Date: 12/24/2023  Patient: Jeremy Sexton   MRN: [id number]   DOB: 03-14-1956   PCP: Garry Kansas, DO    Chief Complaint:   New Patient (Pt is here for PAD visit )      HPI: Jeremy Sexton is a 68 y.o. male who presents to establish care for PAD/nonhealing R foot wound.  He has a past medical history of hypertension, diabetes, hyperlipidemia, and CAD.  He is undergoing workup with Dr. Sheena Dennis for possible CABG versus PCI.    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 Doyne Genin (General Surgery Physician in [place]).  Right foot currently dressed, but patient showed me a picture and wound has not healed.  Patient states he is not seeing a wound care physician.  He underwent ABIs 01/2023 and CT scans 01/2023/06/2023 (results below).  He was sent here for further evaluation.  He denies any lower extremity claudication, rest pain, or wounds to left leg.  He is maintained on atorvastatin  and aspirin  daily.    Past Medical History:  Past Medical History:   Diagnosis Date    Back problem     Diabetes mellitus, type 2     Esophageal reflux     High cholesterol     HTN (hypertension)     Peripheral neuropathy     PVD (peripheral vascular disease) (CMS HCC)     Wears glasses     "Reading glasses"         Social History     Tobacco Use    Smoking status: Never     Passive exposure: Never    Smokeless tobacco: Never   Vaping Use    Vaping status: Never Used   Substance Use Topics    Alcohol use: Never     Comment: Previous, quit 1 year ago    Drug use: Never      Family Medical History:    None            ROS:  Pertinent positives and negatives in  HPI.    Allergies   Allergen Reactions    Cashew [Tree Nuts] Rash and Itching    Tetracyclines Rash and Itching    Bee Venom Protein (Honey Bee)        Current Outpatient Medications   Medication Sig    amLODIPine (NORVASC) 10 mg Oral Tablet Take 1 Tablet (10 mg total) by mouth Daily    aspirin  (ECOTRIN) 81 mg Oral Tablet, Delayed Release (E.C.) Take 1 Tablet (81 mg total) by mouth Daily    atorvastatin  (LIPITOR) 40 mg Oral Tablet Take 1 Tablet (40 mg total) by mouth Every evening    carvediloL  (COREG ) 25 mg Oral Tablet Take 2 Tablets (50 mg total) by mouth Twice daily with food    cloNIDine  HCL (CATAPRES ) 0.1 mg Oral Tablet Take 1 Tablet (0.1 mg total) by mouth Twice daily    famotidine  (PEPCID ) 40 mg Oral Tablet Take 1 Tablet (40 mg total) by mouth Daily    furosemide  (LASIX ) 20 mg Oral Tablet Take 1 Tablet (20  mg total) by mouth Daily    gabapentin  (NEURONTIN ) 400 mg Oral Capsule Take 1 Capsule (400 mg total) by mouth Three times a day    Ibuprofen (MOTRIN) 200 mg Oral Tablet Take 1 Tablet (200 mg total) by mouth Four times a day as needed for Pain    lisinopriL  (PRINIVIL ) 20 mg Oral Tablet Take 1 Tablet (20 mg total) by mouth Daily    metFORMIN (GLUCOPHAGE) 500 mg Oral Tablet Take 1 Tablet (500 mg total) by mouth Twice daily with food    potassium chloride  (K-DUR) 10 mEq Oral Tab Sust.Rel. Particle/Crystal Take 1 Tablet (10 mEq total) by mouth Daily    traZODone  (DESYREL ) 50 mg Oral Tablet Take 1 Tablet (50 mg total) by mouth Every night       Cardiovascular Medications:  ASA: Yes  Statin: Yes  Antiplatelets (Plavix, Brilinta, Effient): No  Anticoagulant: No    Physical Exam:   Vitals:    12/24/23 1046   BP: 111/61   Pulse: 65   SpO2: 98%   Weight: 86.2 kg (190 lb)   Height: 1.84 m (6' 0.44")   BMI: 25.46      Constitutional: AA&O X3 Well developed and well nourished in no acute distress   Neck: Normal ROM, Supple, symmetrical, No bruits noted  Respiratory: Effort normal, no audible wheezing.  Extremities:   Right foot wound.  Dressing in place.    Testing:  ABIs 01/2023 right 1.01, left 1.18.  CTA runoff 01/2023 mild bilateral iliac artery disease with no hemodynamically significant stenosis.  Mild-to-moderate atherosclerotic disease of the abdominal aorta.  There is runoff to both feet via the PTAs.  The ATAs appear to be severely disease bilaterally in at least segmentally occluded bilaterally.  There is incomplete opacification of the dorsalis pedis arteries bilaterally.  CT right lower extremity with IV contrast 06/2023 significant edema/cellulitis pattern throughout the foot and lower leg.  No definite focal fluid collections or gas of the level of the tibia or fibula or hindfoot.  Significant focal soft tissue swelling and a tiny focus of gas in the soft tissues near the head of the 1st metatarsal.  Equivocal for possible infection, correlate with date of surgery.  Equivocal irregularity along the posterior amputation site particularly of the head of the 1st metatarsal.  It is difficult to exclude recurrent infection versus postoperative change with this type of exam.  Further investigation with 3 phase bone scan or MRI could be considered depending on the clinical scenario.    Assessment and Plan    ICD-10-CM    1. PAD (peripheral artery disease) (CMS HCC)  I73.9    Patient with nonhealing surgical sites to right foot.  Testing reviewed.  Plan for RLE angiogram with possible intervention.  Discussed with the patient.  Aware of risks versus benefits.  All questions answered.  Agreeable to proceed.  We will call patient with scheduled date and further instructions.  This will be done after we receive clearance from Dr. Sheena Dennis as patient is undergoing workup for his heart.  Refer to podiatry for further evaluation.  If foot deemed nonsalvageable, patient may require BKA.  Continue with aspirin  statin.    Tight blood pressure and glucose control.     On the day of the encounter, a total of 30 minutes was spent on this  patient encounter including review of historical information, examination, documentation and post-visit activities.   We will plan to continue surveillance and we will see the patient back.  Patients will continue medications as prescribed.  Patient was given the opportunity to ask questions and those questions were answered to their satisfaction. Instructed to call with any further questions or concerns.  Patient seen and examined with Dr. [name].  Jinx Mourning, APRN    VASCULAR ATTENDING: The patient was seen by me and examined as part of a shared service with the APP. The patient was clinically evaluated, and all pertinent lab results and imaging were reviewed. I reviewed the APP note and made any substantive changes necessary. I agree with the APP note assessment and plan.    Darral Ellis, MD     This note was partially generated using MModal Fluency Direct system, and there may be some incorrect words, spellings, and punctuation that were not noted in checking the note before saving, though effort was made to avoid such errors.

## 2023-12-25 ENCOUNTER — Telehealth (HOSPITAL_BASED_OUTPATIENT_CLINIC_OR_DEPARTMENT_OTHER): Payer: Self-pay

## 2023-12-25 NOTE — Telephone Encounter (Signed)
 Jeremy Sexton will need scheduled for CABG/TEE/EVH with Dr. Sheena Dennis.   He is getting angiogram with Dr. [name] first if stents placed will need Plavix x 6 weeks then CABG when we can safely hold.   Looks like he's on lisinopril  and metformin at home   He was given bactroban/chlorhexidine in the office     Thank you

## 2023-12-30 ENCOUNTER — Telehealth (HOSPITAL_BASED_OUTPATIENT_CLINIC_OR_DEPARTMENT_OTHER): Payer: Self-pay | Admitting: VASCULAR SURGERY

## 2023-12-30 NOTE — Telephone Encounter (Signed)
 Scheduled patient for RLE angiogram possible PTA and stent with Dr. [name] on 01/19/24 @ 12 pm with [place] PAT [place].  Spoke to patient and agreed. Verbalized last dose of metfomin 01/16/24.  Verbalized NPO and hibiclens instructions.  Mailed and scanned letter. Orders scanned.   [place], [place]  12/30/2023 16:25

## 2024-01-04 ENCOUNTER — Other Ambulatory Visit: Payer: Self-pay

## 2024-01-04 ENCOUNTER — Ambulatory Visit: Payer: MEDICARE | Attending: PODIATRIST-GENERAL PRACTICE | Admitting: PODIATRIST-GENERAL PRACTICE

## 2024-01-04 ENCOUNTER — Ambulatory Visit (HOSPITAL_BASED_OUTPATIENT_CLINIC_OR_DEPARTMENT_OTHER)
Admission: RE | Admit: 2024-01-04 | Discharge: 2024-01-04 | Disposition: A | Discharge status: Home / Self Care | Payer: MEDICARE | Source: Ambulatory Visit | Attending: PODIATRIST-GENERAL PRACTICE | Admitting: PODIATRIST-GENERAL PRACTICE

## 2024-01-04 DIAGNOSIS — M7989 Other specified soft tissue disorders: Secondary | ICD-10-CM

## 2024-01-04 DIAGNOSIS — I739 Peripheral vascular disease, unspecified: Secondary | ICD-10-CM

## 2024-01-04 DIAGNOSIS — E1152 Type 2 diabetes mellitus with diabetic peripheral angiopathy with gangrene: Secondary | ICD-10-CM | POA: Insufficient documentation

## 2024-01-04 DIAGNOSIS — E11621 Type 2 diabetes mellitus with foot ulcer: Secondary | ICD-10-CM | POA: Insufficient documentation

## 2024-01-04 DIAGNOSIS — L97516 Non-pressure chronic ulcer of other part of right foot with bone involvement without evidence of necrosis: Secondary | ICD-10-CM | POA: Insufficient documentation

## 2024-01-04 DIAGNOSIS — E1140 Type 2 diabetes mellitus with diabetic neuropathy, unspecified: Secondary | ICD-10-CM

## 2024-01-04 DIAGNOSIS — T8789 Other complications of amputation stump: Secondary | ICD-10-CM | POA: Insufficient documentation

## 2024-01-04 DIAGNOSIS — T8189XA Other complications of procedures, not elsewhere classified, initial encounter: Secondary | ICD-10-CM

## 2024-01-04 DIAGNOSIS — S91309A Unspecified open wound, unspecified foot, initial encounter: Secondary | ICD-10-CM

## 2024-01-04 DIAGNOSIS — T148XXA Other injury of unspecified body region, initial encounter: Secondary | ICD-10-CM

## 2024-01-04 DIAGNOSIS — I96 Gangrene, not elsewhere classified: Secondary | ICD-10-CM | POA: Insufficient documentation

## 2024-01-04 DIAGNOSIS — L97502 Non-pressure chronic ulcer of other part of unspecified foot with fat layer exposed: Secondary | ICD-10-CM

## 2024-01-04 DIAGNOSIS — E1142 Type 2 diabetes mellitus with diabetic polyneuropathy: Secondary | ICD-10-CM | POA: Insufficient documentation

## 2024-01-07 ENCOUNTER — Other Ambulatory Visit (HOSPITAL_BASED_OUTPATIENT_CLINIC_OR_DEPARTMENT_OTHER): Payer: Self-pay | Admitting: PODIATRIST-GENERAL PRACTICE

## 2024-01-07 DIAGNOSIS — L97519 Non-pressure chronic ulcer of other part of right foot with unspecified severity: Secondary | ICD-10-CM

## 2024-01-07 LAB — WOUND, SUPERFICIAL/NON-STERILE SITE, AEROBIC CULTURE AND GRAM STAIN

## 2024-01-08 ENCOUNTER — Telehealth (HOSPITAL_COMMUNITY): Payer: Self-pay | Admitting: FAMILY PRACTICE

## 2024-01-08 NOTE — Telephone Encounter (Signed)
 I called Dr Douglass Gelineau Office [phone] to check on URGENT Referral spoke with Peterson Brandt she stated patient referral has been received and she will call patient today [phone] to try and schedule CRO will follow up    [facility]  01/08/2024 09:22

## 2024-01-09 LAB — ANAEROBIC CULTURE

## 2024-01-11 ENCOUNTER — Encounter (HOSPITAL_COMMUNITY): Payer: Self-pay | Admitting: VASCULAR SURGERY

## 2024-01-11 ENCOUNTER — Other Ambulatory Visit (HOSPITAL_COMMUNITY): Payer: Self-pay | Admitting: VASCULAR SURGERY

## 2024-01-11 ENCOUNTER — Telehealth (HOSPITAL_COMMUNITY): Payer: Self-pay | Admitting: PODIATRIST-GENERAL PRACTICE

## 2024-01-11 DIAGNOSIS — Z419 Encounter for procedure for purposes other than remedying health state, unspecified: Secondary | ICD-10-CM

## 2024-01-11 DIAGNOSIS — Z01818 Encounter for other preprocedural examination: Secondary | ICD-10-CM

## 2024-01-11 NOTE — OR PreOp (Signed)
 PAT: Jeremy Sexton call completed. Instructions per Dr. [name]' office. PAT on admit, per telephone message from [facility]. Hibilcens instructions given. Pt advised to use the night before procedure/morning of, from the neck down. Pt voiced an understanding.

## 2024-01-11 NOTE — Telephone Encounter (Signed)
 Called Jeremy Sexton central scheduling s/w Joie Narrow, patient scheduled 02/01/24 @ 1 with arrival time of 12:30 at [facility].    Called patient, he has been notified of appointment, also uploaded and mailed appointment letter.

## 2024-01-18 ENCOUNTER — Ambulatory Visit: Payer: MEDICARE | Attending: PODIATRIST-GENERAL PRACTICE | Admitting: PODIATRIST-GENERAL PRACTICE

## 2024-01-18 ENCOUNTER — Other Ambulatory Visit: Payer: Self-pay

## 2024-01-18 DIAGNOSIS — I96 Gangrene, not elsewhere classified: Secondary | ICD-10-CM | POA: Insufficient documentation

## 2024-01-18 DIAGNOSIS — T148XXA Other injury of unspecified body region, initial encounter: Secondary | ICD-10-CM

## 2024-01-18 DIAGNOSIS — E1152 Type 2 diabetes mellitus with diabetic peripheral angiopathy with gangrene: Secondary | ICD-10-CM | POA: Insufficient documentation

## 2024-01-18 DIAGNOSIS — E11621 Type 2 diabetes mellitus with foot ulcer: Secondary | ICD-10-CM | POA: Insufficient documentation

## 2024-01-18 DIAGNOSIS — E1142 Type 2 diabetes mellitus with diabetic polyneuropathy: Secondary | ICD-10-CM | POA: Insufficient documentation

## 2024-01-18 DIAGNOSIS — L97502 Non-pressure chronic ulcer of other part of unspecified foot with fat layer exposed: Secondary | ICD-10-CM

## 2024-01-18 DIAGNOSIS — L97512 Non-pressure chronic ulcer of other part of right foot with fat layer exposed: Secondary | ICD-10-CM | POA: Insufficient documentation

## 2024-01-19 ENCOUNTER — Encounter (HOSPITAL_COMMUNITY)
Admission: RE | Disposition: A | Discharge status: Home / Self Care | Payer: Self-pay | Source: Ambulatory Visit | Attending: VASCULAR SURGERY

## 2024-01-19 ENCOUNTER — Encounter (HOSPITAL_COMMUNITY): Payer: Self-pay | Admitting: VASCULAR SURGERY

## 2024-01-19 ENCOUNTER — Ambulatory Visit
Admission: RE | Admit: 2024-01-19 | Discharge: 2024-01-19 | Disposition: A | Discharge status: Home / Self Care | Payer: MEDICARE | Source: Ambulatory Visit | Attending: VASCULAR SURGERY | Admitting: VASCULAR SURGERY

## 2024-01-19 ENCOUNTER — Telehealth (HOSPITAL_COMMUNITY): Payer: Self-pay | Admitting: PHYSICIAN ASSISTANT

## 2024-01-19 DIAGNOSIS — E1142 Type 2 diabetes mellitus with diabetic polyneuropathy: Secondary | ICD-10-CM | POA: Insufficient documentation

## 2024-01-19 DIAGNOSIS — I739 Peripheral vascular disease, unspecified: Secondary | ICD-10-CM

## 2024-01-19 DIAGNOSIS — L97519 Non-pressure chronic ulcer of other part of right foot with unspecified severity: Secondary | ICD-10-CM

## 2024-01-19 DIAGNOSIS — Z89421 Acquired absence of other right toe(s): Secondary | ICD-10-CM | POA: Insufficient documentation

## 2024-01-19 DIAGNOSIS — I70211 Atherosclerosis of native arteries of extremities with intermittent claudication, right leg: Secondary | ICD-10-CM | POA: Insufficient documentation

## 2024-01-19 DIAGNOSIS — I70235 Atherosclerosis of native arteries of right leg with ulceration of other part of foot: Secondary | ICD-10-CM

## 2024-01-19 DIAGNOSIS — I10 Essential (primary) hypertension: Secondary | ICD-10-CM | POA: Insufficient documentation

## 2024-01-19 DIAGNOSIS — E7800 Pure hypercholesterolemia, unspecified: Secondary | ICD-10-CM | POA: Insufficient documentation

## 2024-01-19 DIAGNOSIS — Z01818 Encounter for other preprocedural examination: Secondary | ICD-10-CM

## 2024-01-19 DIAGNOSIS — I7092 Chronic total occlusion of artery of the extremities: Secondary | ICD-10-CM | POA: Insufficient documentation

## 2024-01-19 DIAGNOSIS — Z419 Encounter for procedure for purposes other than remedying health state, unspecified: Secondary | ICD-10-CM

## 2024-01-19 DIAGNOSIS — E1151 Type 2 diabetes mellitus with diabetic peripheral angiopathy without gangrene: Secondary | ICD-10-CM | POA: Insufficient documentation

## 2024-01-19 HISTORY — DX: Hyperlipidemia, unspecified: E78.5

## 2024-01-19 HISTORY — DX: Personal history of other diseases of the nervous system and sense organs: Z86.69

## 2024-01-19 HISTORY — DX: Type 2 diabetes mellitus without complications: E11.9

## 2024-01-19 HISTORY — DX: Peripheral vascular disease, unspecified: I73.9

## 2024-01-19 HISTORY — DX: Body mass index (BMI) 25.0-25.9, adult: Z68.25

## 2024-01-19 HISTORY — DX: Other chronic pain: G89.29

## 2024-01-19 LAB — BASIC METABOLIC PANEL
ANION GAP: 15 mmol/L (ref 7–18)
BUN/CREA RATIO: 14
BUN: 12 mg/dL (ref 8–23)
CALCIUM: 9.1 mg/dL (ref 8.3–10.7)
CHLORIDE: 106 mmol/L (ref 96–106)
CO2 TOTAL: 20 mmol/L — ABNORMAL LOW (ref 22–30)
CREATININE: 0.85 mg/dL (ref 0.70–1.20)
ESTIMATED GFR: >90 mL/min/{1.73_m2} (ref >90)
GLUCOSE: 150 mg/dL — ABNORMAL HIGH (ref 74–109)
POTASSIUM: 3.9 mmol/L (ref 3.2–5.0)
SODIUM: 141 mmol/L (ref 133–144)

## 2024-01-19 LAB — CBC WITH DIFF
BASOPHIL #: <0.1 10*3/uL (ref <=0.20)
BASOPHIL %: 0.7 %
EOSINOPHIL #: 0.9 10*3/uL — ABNORMAL HIGH (ref <=0.50)
EOSINOPHIL %: 9.1 %
HCT: 30.2 % — ABNORMAL LOW (ref 38.9–52.0)
HGB: 9.5 g/dL — ABNORMAL LOW (ref 13.4–17.5)
IMMATURE GRANULOCYTE #: <0.1 10*3/uL (ref <0.10)
IMMATURE GRANULOCYTE %: 0.4 % (ref 0.0–1.0)
LYMPHOCYTE #: 2.16 10*3/uL (ref 1.00–4.80)
LYMPHOCYTE %: 21.9 %
MCH: 26 pg (ref 26.0–32.0)
MCHC: 31.5 g/dL (ref 31.0–35.5)
MCV: 82.5 fL (ref 78.0–100.0)
MONOCYTE #: 0.73 10*3/uL (ref 0.20–1.10)
MONOCYTE %: 7.4 %
MPV: 9.9 fL (ref 8.7–12.5)
NEUTROPHIL #: 5.97 10*3/uL (ref 1.50–7.70)
NEUTROPHIL %: 60.5 %
PLATELETS: 217 10*3/uL (ref 150–400)
RBC: 3.66 10*6/uL — ABNORMAL LOW (ref 4.50–6.10)
RDW-CV: 15.4 % (ref 11.5–15.5)
WBC: 9.9 10*3/uL (ref 3.7–11.0)

## 2024-01-19 LAB — PT/INR
INR: 1.04 (ref 0.86–1.14)
PROTHROMBIN TIME: 14 s (ref 12.1–15.3)

## 2024-01-19 LAB — POC BLOOD GLUCOSE (RESULTS): GLUCOSE, POC: 165 mg/dL — ABNORMAL HIGH (ref 70–100)

## 2024-01-19 SURGERY — ANGIOGRAM LOWER EXTREMITY - IMAGING ONLY
Anesthesia: Local (Nurse-Monitored) | Laterality: Right | Wound class: N/A - Imaging Only

## 2024-01-19 MED ORDER — HEPARIN (PORCINE) 1,000 UNIT/ML INJECTION SOLUTION
Freq: Once | INTRAMUSCULAR | Status: DC | PRN
Start: 2024-01-19 — End: 2024-01-19
  Administered 2024-01-19: 5000 [IU] via INTRAVENOUS

## 2024-01-19 MED ORDER — FENTANYL (PF) 50 MCG/ML INJECTION SOLUTION
INTRAMUSCULAR | Status: AC
Start: 2024-01-19 — End: 2024-01-19
  Filled 2024-01-19: qty 2

## 2024-01-19 MED ORDER — MIDAZOLAM 1 MG/ML INJECTION WRAPPER
INTRAMUSCULAR | Status: AC
Start: 2024-01-19 — End: 2024-01-19
  Filled 2024-01-19: qty 2

## 2024-01-19 MED ORDER — FENTANYL (PF) 50 MCG/ML INJECTION WRAPPER
INJECTION | Freq: Once | INTRAMUSCULAR | Status: DC | PRN
Start: 2024-01-19 — End: 2024-01-19
  Administered 2024-01-19 (×2): 25 ug via INTRAVENOUS
  Administered 2024-01-19: 50 ug via INTRAVENOUS

## 2024-01-19 MED ORDER — LIDOCAINE HCL 10 MG/ML (1 %) INJECTION SOLUTION
INTRAMUSCULAR | Status: AC
Start: 2024-01-19 — End: 2024-01-19
  Filled 2024-01-19: qty 20

## 2024-01-19 MED ORDER — MIDAZOLAM 1 MG/ML INJECTION WRAPPER
Freq: Once | INTRAMUSCULAR | Status: DC | PRN
Start: 2024-01-19 — End: 2024-01-19
  Administered 2024-01-19: 2 mg via INTRAVENOUS
  Administered 2024-01-19: 1 mg via INTRAVENOUS

## 2024-01-19 SURGICAL SUPPLY — 16 items
CATH ANGIO 5FR OMNI CURVE 65CM SFT-VU RADOPQ FLUSH HTORQ SHAFT VSCR ACPT .035IN GW (CATHETERS) ×1 IMPLANT
CATH COYOTE 2.5MM 220MM 150CM OTW ULTRA LOW PROF BAL DIL NYBAX BSLD ACPT .014IN GW 4FR INTROD SHEATH (BALLOON) ×1 IMPLANT
CATH COYOTE 2MM 220MM 150CM OTW ULTRA LOW PROF BAL DIL NYBAX BSLD ACPT .014IN GW 4FR INTROD SHEATH 6 (BALLOON) ×1 IMPLANT
CATH SUP NAVICROSS 150CM 40MM 60MM SPC RADOPQ ANG TIP 2 BRD SS ACPT .018IN GW (CATHETERS) ×1 IMPLANT
DEVICE CLSR PRGLD PERCLOSE 6FR SUT MEDIATE KNT PUSH VAS STRL DISP (IMPLANTS CARDIOTHORACIC) ×1 IMPLANT
DEVICE INFLAT PRESTO LRG BRL INFL LOW / ULTRA HI PRESS 30CC STRL DISP (ENDOSCOPIC SUPPLIES) ×1 IMPLANT
GW CHC PT .014IN 35CM 300CM X RAIL SUP RADOPQ VAS J CURVE (WIRE) ×1 IMPLANT
GW GLDWR .035IN 3CM 150CM FLXB ANG TIP RADOPQ KINK RST NITINOL TUNG POLYUR HDRPH VAS STD (WIRE) ×1 IMPLANT
GW HITRQ SPARTA .014IN 5CM 300CM COR TO TIP VAS PERI (WIRE) ×1 IMPLANT
GW HITRQ SUPRA .035IN 190CM ATRAUMA COR TO TIP HDRPHB VAS PERI DISP (WIRE) ×1 IMPLANT
GW V-18 CONTROLWIRE .018IN 8CM 300CM VAS PERI STR (WIRE) ×2 IMPLANT
KIT MICROINTRO 40CM 7CM .018IN 5FR NITINOL SS STIFFEN DIL SMOOTH NEEDLE SFT TIP MNDRL (VASCULAR) ×1 IMPLANT
PACK ANGIOGRAPHY CV STRL LF DISP THMS HLTH SYS CATH (CUSTOM TRAYS & PACK) ×1 IMPLANT
SHEATH GD 45CM 6FR .109IN .087IN PINN DESTINATION STR SS NYL HDRPH PTFE RADOPQ XCUT VALVE KINK RST (INTRODUCER) ×1 IMPLANT
SHEATH GD 90CM 4FR 1.5MM FLEXOR SHTL HDRPH TIB MODIFICATION RADOPQ ACPT .018/.038IN GW (VASCULAR) ×1 IMPLANT
SHEATH INTROD 10CM 5FR PINN COR SS HDRPH PTFE SNAPON DIL LOCK KINK RST SMOOTH TRNS 2.5CM ACPT .038IN (VASCULAR) ×1 IMPLANT

## 2024-01-19 NOTE — Brief Op Note (Signed)
 OPERATIVE NOTE    Patient Name: Jeremy Sexton, Jeremy Sexton MRN:: [id number]  Date of Birth: [birth year]  Date of Service: 01/19/24     Pre-Operative Diagnosis:   1. Right lower extremity chronic limb ischemia  2. Right foot wounds     Post-Operative Diagnosis:   1. Right lower extremity chronic limb ischemia  2. Right foot wounds     Procedure(s)/Description:   1. Ultrasound-guided cannulation of the left common femoral artery  2. Percutaneous closure of the left common femoral artery  3. Aortoiliofemoral arteriogram  4. Third order right lower extremity arteriogram  5. Balloon angioplasty of the right anterior tibial artery  6. Administration of moderate conscious sedation for greater than 30 minutes    Attending Surgeon:   Garnette Murphy, MD     Anesthesia Type:   Local + moderate conscious sedation     Findings:  1. Patent aortoiliac vessels bilaterally  2. Patent right common femoral artery, profunda femoral artery, superficial femoral artery, and popliteal artery  3. Patent tibioperoneal trunk and peroneal artery  4. Patent posterior tibial artery with sporadic, focal areas of greater than 50% stenoses; incomplete plantar arch, with no perfusion of the forefoot from the posterior tibial artery  5. Chronic total occlusion of the anterior tibial artery of greater than 35 cm; successful crossing of the chronic total occlusion; balloon angioplasty with 2 mm and 2.5 mm balloons, with no significant residual stenoses or flow-limiting dissections in the areas treated    Estimated Blood Loss:   Minimal    Implants:   Implant Name: DEVICE CLSR PRGLD PERCLOSE 6FR SUT MEDIATE KNT PUSH VAS STRL DISP - [id number]  Type:   Inv. Item: DEVICE CLSR PRGLD PERCLOSE 6FR SUT MEDIATE KNT PUSH VAS STRL DISP   Serial No.:   Manufacturer: [employer]  Lot No.:   [id number]: Right  No. Used: 1  Action: Implanted    Specimens Removed:   None     Complications:    None    Condition:    Stable    Disposition:     Hybrid suite holding area -  hemodynamically stable    Indications:  The patient is a 68 y.o. male with right lower extremity tibial disease and nonhealing wounds on his right foot.  He presents for a right lower extremity arteriogram with possible transcatheter intervention.  Risks and benefits were discussed with the patient in detail, and informed consent was obtained.    Description of Procedure/Operation:      Garnette Murphy, MD

## 2024-01-19 NOTE — Telephone Encounter (Signed)
 Jeremy Sexton needs scheduled for follow up in 6 weeks with RLE arterial duplex and ABI with Dr. Gwenda

## 2024-01-19 NOTE — Nurses Notes (Signed)
 Patient is ready for DC.  Left groin site remained CDI and no S/O hematoma or bleeding.  DC instructions reviewed with patient and son. IV removed with catheter intact.  Pt wheeled to front exit and driven home by son.

## 2024-01-19 NOTE — Discharge Instructions (Signed)
 No driving x 24 hours  Continue Aspirin  81mg  daily   Gradually increase activity as tolerated  Remove dressing 24 hours after the procedure, then remove and clean with antibacterial soap and water , apply Neosporin and band-aid. Do this every day until completely healed  You may shower 24 hours after the procedure, but do not submerge site in water  for 14 days.  No strenuous or heavy lifting for 7 days  If any bleeding or swelling should occur, apply manual pressure directly over the access site and report to the nearest hospital for evaluation  Seek immediate medical attention if there is any loss of sensation, redness, swelling or discharge at the access site.  For moderate discomfort, elevate the leg and apply ice as needed  Call your vascular surgeon or go to the emergency room with any new symptoms.  We will schedule follow up in 6 weeks with testing

## 2024-01-19 NOTE — H&P (Signed)
 [facility]  History and Physical      Patient Name: Jeremy Sexton  Age: 68 y.o.  DOB: 08/17/1955  Date of Surgery: 01/19/2024   MRN: [id number]    Admitting Physician: Gwenda Senior, MD [phone]     Surgical Procedure:  Right lower extremity angiogram - imaging only, possible PTA and stent placement    History of Present Illness:  Jeremy Sexton is a 68 year old male with a past medical history of type 2 diabetes, hypertension, hypercholesterolemia, and peripheral vascular disease with claudication.  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 Since the surgery he has had a non-healing wound, in addition to his PVD he was assessed by vascular surgery and is to undergo a right lower extremity angiogram - imaging only, possible PTA and stent placement performed by Dr. Gwenda on 01/19/24. He denies any discomfort, fever or chills.        Past Medical History  No current outpatient medications on file.      Allergies[1]  Past Medical History:   Diagnosis Date    Back problem     BMI 25.0-25.9,adult     Chronic pain     back pain    Diabetes mellitus, type 2     Esophageal reflux     H/O hearing loss     High cholesterol     HTN (hypertension)     Hyperlipidemia     Peripheral neuropathy     PVD (peripheral vascular disease) (CMS HCC)     PVD (peripheral vascular disease) with claudication (CMS HCC)     Type 2 diabetes mellitus     Wears glasses     Reading glasses         Past Surgical History:   Procedure Laterality Date    COLONOSCOPY      HX BACK SURGERY      x2    HX HEART CATHETERIZATION      HX KNEE SURGERY Right     HX OTHER Right     arm surgery    HX TONSILLECTOMY      HX UPPER ENDOSCOPY      TOE AMPUTATION Right     great toe    TOE AMPUTATION Right     2nd and 3rd digit         Family Medical History:    None         Social History     Socioeconomic History    Marital status: Widowed   Tobacco Use    Smoking status: Never     Passive exposure: Never    Smokeless tobacco:  Never   Vaping Use    Vaping status: Never Used   Substance and Sexual Activity    Alcohol use: Never     Comment: Previous, quit 1 year ago    Drug use: Never    Sexual activity: Not Currently   Other Topics Concern    Ability to Walk 1 Flight of Steps without SOB/CP Yes    Routine Exercise Yes    Ability to Walk 2 Flight of Steps without SOB/CP Yes    Unable to Ambulate No    Total Care No    Ability To Do Own ADL's Yes    Uses Walker No    Other Activity Level Yes    Uses Cane Yes     Social Determinants of Health     Social Connections: Medium Risk (  11/30/2023)    Social Connections     SDOH Social Isolation: 3 to 5 times a week          IMMUNIZATION:   Immunization History   Administered Date(s) Administered    Covid-19 Vaccine,Moderna Bivalent 05/03/2021    Covid-19 Vaccine,Moderna(Spikevax),89mcg/0.5mL,12 yr+,Seasonal 07/09/2022    Covid-19 Vaccine,Moderna,12 Years+ 03/13/2020, 04/10/2020, 11/05/2020        Review of Systems   Constitutional:  Negative for chills and fever.   Respiratory:  Negative for cough, shortness of breath and wheezing.    Cardiovascular:  Positive for palpitations. Negative for chest pain, claudication and leg swelling.   Gastrointestinal:  Negative for constipation, diarrhea, nausea and vomiting.   Genitourinary:  Negative for dysuria and hematuria.   Skin:  Negative for rash.        Open wound on right foot   Neurological:  Negative for dizziness and headaches.        BP (!) 144/63   Pulse (!) 49   Temp 36.1 C (96.9 F)   Resp 16   Ht 1.854 m (6' 1)   Wt 88.5 kg (195 lb)   SpO2 100%   BMI 25.73 kg/m         PHYSICAL EXAMINATION:  CONSTITUTIONAL: Cooperative, Well-developed, well-nourished, in no acute distress.  SKIN: Warm and dry with good turgor; no rash or open wounds observed  HEENT: head is normocephalic and atraumatic. Extraocular motions are intact.  External ear is normal.  Nares are patent.  NECK: Supple without lymphadenopathy.  Non-tender to palpation  RESPIRATORY:  Clear to auscultation bilaterally without wheezes or rhonchi.  No tachypnea or use of accessory muscles  CARDIOVASCULAR: Heart is regular rate and rhythm without murmur.  Pulses are +2/4 and symmetrical bilaterally.    ABDOMEN: Bowel sounds are normoactive.  Abdomen is soft, non-tender, and non distended.  No rebound tenderness noted.   RECTAL: Exam deferred.  GENITOURINARY: Exam deferred.  MUSCULOSKELETAL: Right foot wrapped. For complete exam refer to office note.  Free active range of motion without any obvious limitation or pain.   NEUROLOGIC: Cranial nerves are grossly intact without acute deficit.  No focal weakness.  Normal speech.  No fasciculations.  Moving all extremities.  Vision and hearing grossly intact.  PSYCHIATRIC: Cooperative. Normal affect.  No suicidal or homicidal ideation.             Assessment and Plan:  The patient has agreed to undergo a right lower extremity angiogram - imaging only, possible PTA and stent placement performed by Dr. Arloa on 01/19/2024.    Nat DELENA Sar, PA-C  01/19/2024     VASCULAR ATTENDING: The patient was seen by me and examined. The patient was clinically evaluated, and all pertinent lab results and imaging were reviewed. I reviewed the above note and made any substantive changes necessary. I agree with the APP note assessment and plan.    Garnette Murphy, MD        [1]   Allergies  Allergen Reactions    Junette Dillon Nuts] Rash and Itching    Tetracyclines Rash and Itching    Bee Venom Protein (Honey Bee)

## 2024-01-20 ENCOUNTER — Other Ambulatory Visit (HOSPITAL_BASED_OUTPATIENT_CLINIC_OR_DEPARTMENT_OTHER): Payer: Self-pay | Admitting: PODIATRIST-GENERAL PRACTICE

## 2024-01-20 LAB — WOUND, SUPERFICIAL/NON-STERILE SITE, AEROBIC CULTURE AND GRAM STAIN

## 2024-01-20 MED ORDER — LINEZOLID 600 MG TABLET
600.0000 mg | ORAL_TABLET | Freq: Two times a day (BID) | ORAL | 0 refills | Status: AC
Start: 2024-01-20 — End: 2024-02-03

## 2024-01-22 ENCOUNTER — Telehealth (HOSPITAL_BASED_OUTPATIENT_CLINIC_OR_DEPARTMENT_OTHER): Payer: Self-pay | Admitting: THORACIC SURGERY CARDIOTHORACIC VASCULAR SURGERY

## 2024-01-22 NOTE — Telephone Encounter (Signed)
 Scheduled patient for CABG/TEE/ECh with Dr. Skipper on 02/23/24 @645am  PAT 02/22/24 @1pm .  Spoke to patient and patients daughter and agreed. Patient is no longer on Warfarin.  Verbalized last dose of Eliquis and Lisinopril  02/20/24.  Verbalized start bactroban and bowel prep 02/21/24. Verbalized hold all vitamins and motrin starting 02/15/24.  Verbalized NPO and hibiclens insrtuctions.  Mailed and scanned letter. Orders scanned.   [place], [place]  01/22/2024 14:19

## 2024-01-23 LAB — ANAEROBIC CULTURE

## 2024-01-26 NOTE — Sedation Documentation (Signed)
 Procedural Sedation Note  01/26/24  Procedure(s):  ANGIOGRAM LOWER EXTREMITY - IMAGING ONLY/ RLE ANGIOGRAM POSSIBLE PTA AND STENT    Diagnosis:     Sedation Informed Consent, pre-sedation risk assessment and evaluation completed.  History of previous adverse experiences with sedation/analgesia/anesthesia assessed.  Monitored conscious sedation was administered under my direct supervision by an appropriately trained sedation nurse.  Appropriate Facility and Equipment compliant.      Procedure time out      Physician in  and out times      Sedation and Procedure Times:           Proc Name Event Type Event Time    ANGIOGRAM LOWER EXTREMITY - IMAGING ONLY  Incision Start Tue Jan 19, 2024 12:06 PM    ANGIOGRAM LOWER EXTREMITY - IMAGING ONLY  Incision Close Tue Jan 19, 2024 12:55 PM            Aldrete Scores    Pre Sedation  Activity: 2-->able to move 4 extremities voluntarily or on command  Respiration: 2-->able to breathe and cough freely  Circulation: 2-->BP within 20% of pre-anesthetic level  Consciousness: 2-->fully awake  O2 Saturation: 2-->able to maintain O2 saturation greater than 92% on room air  Dressing: 2-->dry and clean or not applicable  Pain: 2-->pain free  Ambulation: 2-->able to stand up and walk straight, on ordered bedrest, or performing at previous level of functioning  Fasting/Feeding: 2-->able to drink fluids or NPO, minimal nausea/ no vomiting  Urine Output: 2-->has voided, adequate urine output per device, or not applicable  Modified Aldrete Score: 20      Post Sedation  Assessment Scored:: Post-Procedure  Activity: 2-->able to move 4 extremities voluntarily or on command  Respiration: 2-->able to breathe and cough freely  Circulation: 2-->BP within 20% of pre-anesthetic level  Consciousness: 1-->arousable on calling  O2 Saturation: 2-->able to maintain O2 saturation greater than 92% on room air  Dressing: 2-->dry and clean or not applicable  Pain: 2-->pain free  Ambulation: 2-->able to stand up  and walk straight, on ordered bedrest, or performing at previous level of functioning  Urine Output: 2-->has voided, adequate urine output per device, or not applicable  Post Modified Aldrete Score: 19                                      Patient was continuously monitored throughout the procedure.  Provider was in attendance throughout sedation.  See Invasive Procedure Log for additional details.    Garnette Murphy, MD

## 2024-01-26 NOTE — H&P (Unsigned)
 INFECTIOUS DISEASES, [facility]  [address]  [place] [address]  Operated by [facility]  History and Physical    Name: Jeremy Sexton MRN:  [id number]   Date: 01/27/2024 DOB:  [birth year] (68 y.o.)         Referring Provider:   Steen Raisin, DPM  [address]  [address]  [place],  [place] [address]    History of Present Illness  Jeremy Sexton is a 68 y.o. male with type 2 diabetes, hypertension, hyperlipidemia and peripheral vascular disease with claudication.  He was referred to the ID clinic due to suspected osteomyelitis.  Patient underwent 1st, 2nd and 3rd digit amputation in July of 2024 and has had a nonhealing wound of the site since.  He underwent a right lower extremity angiogram on 06/24 and underwent balloon angioplasty of the right anterior tibial artery.  X-ray of the foot on 06/09 was concerning for possible osteomyelitis involving the 4th and potentially 5th metatarsal.  Wound cultures grew Corynebacterium striatum on 06/23 and pasteurella multocida and Alcaligenes species on 06/09.SABRA  Patient was prescribed linezolid  on 06/25.    Patient does endorse some fatigue and weakness.  He denies any other significant symptoms.  Past History  Current Outpatient Medications   Medication Sig    amLODIPine (NORVASC) 10 mg Oral Tablet Take 1 Tablet (10 mg total) by mouth Daily    aspirin  (ECOTRIN) 81 mg Oral Tablet, Delayed Release (E.C.) Take 1 Tablet (81 mg total) by mouth Daily    atorvastatin  (LIPITOR) 40 mg Oral Tablet Take 1 Tablet (40 mg total) by mouth Every evening    carvediloL  (COREG ) 25 mg Oral Tablet Take 2 Tablets (50 mg total) by mouth Twice daily with food    cloNIDine  HCL (CATAPRES ) 0.1 mg Oral Tablet Take 1 Tablet (0.1 mg total) by mouth Twice daily    famotidine  (PEPCID ) 40 mg Oral Tablet Take 1 Tablet (40 mg total) by mouth Twice daily    furosemide  (LASIX ) 20 mg Oral Tablet Take 1 Tablet (20 mg total) by mouth Daily    gabapentin  (NEURONTIN ) 400 mg Oral  Capsule Take 1 Capsule (400 mg total) by mouth Three times a day    Ibuprofen (MOTRIN) 200 mg Oral Tablet Take 3 Tablets (600 mg total) by mouth Four times a day as needed for Pain    linezolid  (ZYVOX ) 600 mg Oral Tablet Take 1 Tablet (600 mg total) by mouth Twice daily for 14 days    lisinopriL  (PRINIVIL ) 20 mg Oral Tablet Take 1 Tablet (20 mg total) by mouth Daily    metFORMIN (GLUCOPHAGE) 500 mg Oral Tablet Take 1 Tablet (500 mg total) by mouth Twice daily with food    potassium chloride  (K-DUR) 10 mEq Oral Tab Sust.Rel. Particle/Crystal Take 1 Tablet (10 mEq total) by mouth Daily    traZODone  (DESYREL ) 50 mg Oral Tablet Take 1 Tablet (50 mg total) by mouth Every night     Allergies[1]  Past Medical History:   Diagnosis Date    Back problem     BMI 25.0-25.9,adult     Chronic pain     back pain    Diabetes mellitus, type 2     Esophageal reflux     H/O hearing loss     High cholesterol     HTN (hypertension)     Hyperlipidemia     Peripheral neuropathy     PVD (peripheral vascular disease) (CMS HCC)  PVD (peripheral vascular disease) with claudication (CMS HCC)     Type 2 diabetes mellitus     Wears glasses     Reading glasses         Past Surgical History:   Procedure Laterality Date    COLONOSCOPY      HX BACK SURGERY      x2    HX HEART CATHETERIZATION      HX KNEE SURGERY Right     HX OTHER Right     arm surgery    HX TONSILLECTOMY      HX UPPER ENDOSCOPY      TOE AMPUTATION Right     great toe    TOE AMPUTATION Right     2nd and 3rd digit           Family History  Family Medical History:    None           Social History  Place of residence: [employer] [place]   Occupation:  Used to work for the [employer].  Worked as a Programmer, multimedia.  Tobacco:  None  ETOH:  None  Drugs:  None  Pets:  1 cat and 1 dog.  Either have bitten or look to the area around the wound.      Review of Systems  Other than ROS in the HPI, all other systems were negative.    Physical Examination  General: well-appearing, in  no acute distress  Eyes: no conjunctival erythema, no scleral icterus  ENT: no mucosal ulcers, no oral candidiasis  Chest: lungs clear to auscultation bilaterally with no wheezing or rhonchi  CV: heart regular rate and rhythm, no murmurs  Abdomen: soft, nontender, nondistended, normal bowel sounds  Skin: no rashes  Extremities: no edema or cyanosis.  See image of right foot attached below:  Musculoskeletal: no red or swollen joints  Neurological: alert and oriented x 3, Patient answered all questions appropriately        Diagnosis:  Assessment/Plan   1. Ulcer of right foot, unspecified ulcer stage (CMS HCC)      Assessment/Plan:  No orders of the defined types were placed in this encounter.  68 y.o. male with a wound on his right foot at the site of the former amputation that has been very slow to heal.  Recent cultures grew Corynebacterium, pasteurella and alcaligenes. Alcaligenes the somewhat of a more resistant bacteria min treatment would usually involve a cephalosporin, Bactrim  or an IV regimen.  Patient has an MRI in 5 days and he does not have any definitive evidence of osteomyelitis at present.  If osteomyelitis is found on the MRI, Podiatry may take patient back to OR or have him undergo a local debridement, so I do not want to start antibiotics empirically in this stable patient if high-quality wound or surgical culture is imminent.  Since the patient is stable at present, I will hold off on current therapy but see the patient in follow-up in 2-3 weeks after his MRI is done.  I have told the patient and an advise Podiatry that I would could start antibiotics, even IV, within 2-3 business days with reasonable certainty if any urgent issues arose.  -we will see patient back after MRI in about 2-3 weeks  -CRP, sedimentation rate, CBC/diff ordered today  Follow Up:  No follow-ups on file.      Goddess Gebbia D Lindsay IV, MD           [1]   Allergies  Allergen Reactions    Junette Dillon Nuts] Rash and Itching     Tetracyclines Rash and Itching    Bee Venom Protein (Honey Bee)

## 2024-01-27 ENCOUNTER — Other Ambulatory Visit: Payer: Self-pay

## 2024-01-27 ENCOUNTER — Ambulatory Visit (HOSPITAL_COMMUNITY): Payer: MEDICARE

## 2024-01-27 ENCOUNTER — Ambulatory Visit: Payer: MEDICARE | Attending: INFECTIOUS DISEASE | Admitting: INFECTIOUS DISEASE

## 2024-01-27 VITALS — BP 127/59 | HR 57

## 2024-01-27 DIAGNOSIS — Z89431 Acquired absence of right foot: Secondary | ICD-10-CM

## 2024-01-27 DIAGNOSIS — Z8619 Personal history of other infectious and parasitic diseases: Secondary | ICD-10-CM

## 2024-01-27 DIAGNOSIS — L97519 Non-pressure chronic ulcer of other part of right foot with unspecified severity: Secondary | ICD-10-CM

## 2024-01-27 LAB — C-REACTIVE PROTEIN(CRP),INFLAMMATION: C-REACTIVE PROTEIN (CRP): <0.3 mg/dL (ref 0.0–0.9)

## 2024-01-27 LAB — CBC
HCT: 28.6 % — ABNORMAL LOW (ref 38.9–52.0)
HGB: 8.7 g/dL — ABNORMAL LOW (ref 13.4–17.5)
MCH: 26.3 pg (ref 26.0–32.0)
MCHC: 30.4 g/dL — ABNORMAL LOW (ref 31.0–35.5)
MCV: 86.4 fL (ref 78.0–100.0)
MPV: 9.8 fL (ref 8.7–12.5)
PLATELETS: 189 10*3/uL (ref 150–400)
RBC: 3.31 10*6/uL — ABNORMAL LOW (ref 4.50–6.10)
RDW-CV: 15.8 % — ABNORMAL HIGH (ref 11.5–15.5)
WBC: 9.2 10*3/uL (ref 3.7–11.0)

## 2024-01-27 LAB — SEDIMENTATION RATE: ERYTHROCYTE SEDIMENTATION RATE (ESR): 18 mm/h — ABNORMAL HIGH (ref 0–12)

## 2024-02-01 ENCOUNTER — Other Ambulatory Visit: Payer: Self-pay

## 2024-02-01 ENCOUNTER — Ambulatory Visit (HOSPITAL_BASED_OUTPATIENT_CLINIC_OR_DEPARTMENT_OTHER)
Admission: RE | Admit: 2024-02-01 | Discharge: 2024-02-01 | Disposition: A | Discharge status: Home / Self Care | Payer: MEDICARE | Source: Ambulatory Visit | Attending: PODIATRIST-GENERAL PRACTICE

## 2024-02-01 ENCOUNTER — Ambulatory Visit: Payer: MEDICARE | Attending: PODIATRIST-GENERAL PRACTICE | Admitting: PODIATRIST-GENERAL PRACTICE

## 2024-02-01 ENCOUNTER — Ambulatory Visit (INDEPENDENT_AMBULATORY_CARE_PROVIDER_SITE_OTHER): Payer: Self-pay | Admitting: INFECTIOUS DISEASE

## 2024-02-01 DIAGNOSIS — L03115 Cellulitis of right lower limb: Secondary | ICD-10-CM | POA: Insufficient documentation

## 2024-02-01 DIAGNOSIS — I96 Gangrene, not elsewhere classified: Secondary | ICD-10-CM | POA: Insufficient documentation

## 2024-02-01 DIAGNOSIS — L97502 Non-pressure chronic ulcer of other part of unspecified foot with fat layer exposed: Secondary | ICD-10-CM

## 2024-02-01 DIAGNOSIS — L97516 Non-pressure chronic ulcer of other part of right foot with bone involvement without evidence of necrosis: Secondary | ICD-10-CM | POA: Insufficient documentation

## 2024-02-01 DIAGNOSIS — Z89421 Acquired absence of other right toe(s): Secondary | ICD-10-CM | POA: Insufficient documentation

## 2024-02-01 DIAGNOSIS — R936 Abnormal findings on diagnostic imaging of limbs: Secondary | ICD-10-CM | POA: Insufficient documentation

## 2024-02-01 DIAGNOSIS — L97519 Non-pressure chronic ulcer of other part of right foot with unspecified severity: Secondary | ICD-10-CM

## 2024-02-01 DIAGNOSIS — E1152 Type 2 diabetes mellitus with diabetic peripheral angiopathy with gangrene: Secondary | ICD-10-CM | POA: Insufficient documentation

## 2024-02-01 DIAGNOSIS — E1142 Type 2 diabetes mellitus with diabetic polyneuropathy: Secondary | ICD-10-CM | POA: Insufficient documentation

## 2024-02-01 DIAGNOSIS — M869 Osteomyelitis, unspecified: Secondary | ICD-10-CM

## 2024-02-01 DIAGNOSIS — Z89411 Acquired absence of right great toe: Secondary | ICD-10-CM | POA: Insufficient documentation

## 2024-02-01 DIAGNOSIS — E11621 Type 2 diabetes mellitus with foot ulcer: Secondary | ICD-10-CM | POA: Insufficient documentation

## 2024-02-01 DIAGNOSIS — G629 Polyneuropathy, unspecified: Secondary | ICD-10-CM

## 2024-02-12 ENCOUNTER — Other Ambulatory Visit (HOSPITAL_COMMUNITY): Payer: Self-pay | Admitting: THORACIC SURGERY CARDIOTHORACIC VASCULAR SURGERY

## 2024-02-12 ENCOUNTER — Encounter (HOSPITAL_COMMUNITY): Payer: Self-pay | Admitting: THORACIC SURGERY CARDIOTHORACIC VASCULAR SURGERY

## 2024-02-12 DIAGNOSIS — Z419 Encounter for procedure for purposes other than remedying health state, unspecified: Secondary | ICD-10-CM

## 2024-02-12 DIAGNOSIS — Z01818 Encounter for other preprocedural examination: Secondary | ICD-10-CM

## 2024-02-12 NOTE — OR PreOp (Addendum)
 PAT Pre-Op Nurse Note    Cardiology:  Patient reports his general cardiologist is [facility] where he had an abnormal stress test and was referred to Dr. Neomi for heart catheterization.  He denies he has had chest pain or SOB.    Oral health:  Patient reports upper and lower full dentures    Skin:  Patient reports he has a non-healing wound on his right foot after having great, middle and 3rd toe amputations.  Patient had RLE angiogram w/stenting, Dr. Gwenda on 01-19-24.  He sees Harlene Nam, DPM in wound care at [facility] every 2 weeks.  Patient reports he recently completed an antibiotic prescribed by Dr. Nam.  Patient states he has a family member who is a Engineer, civil (consulting) and she does regular dressing changes at home.  Secure messages to Sari Burrows & Duwaine Devonshire in office to confirm Dr. Janina awareness of this wound.  Both respond that Dr. Skipper is aware.    Pre-op instructions:  Advised patient to follow all pre-operative instructions given by Dr. Janina office.  Confirmed PAT appointment for Monday 02-22-24 @ 1230, registration @ 1200.  Patient states he was given a bottle of Hibiclens soap.  Instructed him to wash from neck to toes both the night prior to & morning of surgery avoiding genitals and mucous membranes of his body.  Patient verbalized understanding of these instructions.

## 2024-02-15 ENCOUNTER — Ambulatory Visit: Payer: MEDICARE | Attending: PODIATRIST-GENERAL PRACTICE | Admitting: PODIATRIST-GENERAL PRACTICE

## 2024-02-15 ENCOUNTER — Other Ambulatory Visit: Payer: Self-pay

## 2024-02-15 DIAGNOSIS — E1152 Type 2 diabetes mellitus with diabetic peripheral angiopathy with gangrene: Secondary | ICD-10-CM | POA: Insufficient documentation

## 2024-02-15 DIAGNOSIS — L97515 Non-pressure chronic ulcer of other part of right foot with muscle involvement without evidence of necrosis: Secondary | ICD-10-CM | POA: Insufficient documentation

## 2024-02-15 DIAGNOSIS — E11621 Type 2 diabetes mellitus with foot ulcer: Secondary | ICD-10-CM | POA: Insufficient documentation

## 2024-02-15 DIAGNOSIS — L97516 Non-pressure chronic ulcer of other part of right foot with bone involvement without evidence of necrosis: Secondary | ICD-10-CM | POA: Insufficient documentation

## 2024-02-15 DIAGNOSIS — L97502 Non-pressure chronic ulcer of other part of unspecified foot with fat layer exposed: Secondary | ICD-10-CM

## 2024-02-15 DIAGNOSIS — I739 Peripheral vascular disease, unspecified: Secondary | ICD-10-CM

## 2024-02-15 DIAGNOSIS — E1142 Type 2 diabetes mellitus with diabetic polyneuropathy: Secondary | ICD-10-CM | POA: Insufficient documentation

## 2024-02-15 DIAGNOSIS — I96 Gangrene, not elsewhere classified: Secondary | ICD-10-CM | POA: Insufficient documentation

## 2024-02-15 DIAGNOSIS — L84 Corns and callosities: Secondary | ICD-10-CM | POA: Insufficient documentation

## 2024-02-17 ENCOUNTER — Encounter (INDEPENDENT_AMBULATORY_CARE_PROVIDER_SITE_OTHER): Payer: Self-pay | Admitting: INFECTIOUS DISEASE

## 2024-02-22 ENCOUNTER — Ambulatory Visit (HOSPITAL_COMMUNITY): Payer: MEDICARE

## 2024-02-22 ENCOUNTER — Ambulatory Visit (HOSPITAL_COMMUNITY): Payer: Self-pay

## 2024-02-22 ENCOUNTER — Ambulatory Visit (HOSPITAL_BASED_OUTPATIENT_CLINIC_OR_DEPARTMENT_OTHER)
Admission: RE | Admit: 2024-02-22 | Discharge: 2024-02-22 | Disposition: A | Discharge status: Home / Self Care | Payer: MEDICARE | Source: Ambulatory Visit | Attending: THORACIC SURGERY CARDIOTHORACIC VASCULAR SURGERY | Admitting: THORACIC SURGERY CARDIOTHORACIC VASCULAR SURGERY

## 2024-02-22 ENCOUNTER — Other Ambulatory Visit: Payer: Self-pay

## 2024-02-22 ENCOUNTER — Encounter (HOSPITAL_COMMUNITY): Payer: Self-pay

## 2024-02-22 DIAGNOSIS — Z419 Encounter for procedure for purposes other than remedying health state, unspecified: Secondary | ICD-10-CM | POA: Insufficient documentation

## 2024-02-22 DIAGNOSIS — R9401 Abnormal electroencephalogram [EEG]: Secondary | ICD-10-CM

## 2024-02-22 DIAGNOSIS — Z01818 Encounter for other preprocedural examination: Secondary | ICD-10-CM

## 2024-02-22 DIAGNOSIS — I2129 ST elevation (STEMI) myocardial infarction involving other sites: Secondary | ICD-10-CM

## 2024-02-22 DIAGNOSIS — I10 Essential (primary) hypertension: Secondary | ICD-10-CM

## 2024-02-22 DIAGNOSIS — I2510 Atherosclerotic heart disease of native coronary artery without angina pectoris: Secondary | ICD-10-CM

## 2024-02-22 DIAGNOSIS — R001 Bradycardia, unspecified: Secondary | ICD-10-CM

## 2024-02-22 HISTORY — DX: Other injury of unspecified body region, initial encounter: T14.8XXA

## 2024-02-22 LAB — ECG 12 LEAD
Atrial Rate: 58 {beats}/min
Calculated P Axis: 46 degrees
Calculated R Axis: 19 degrees
Calculated T Axis: 40 degrees
PR Interval: 208 ms
QRS Duration: 88 ms
QT Interval: 432 ms
QTC Calculation: 424 ms
Ventricular rate: 58 {beats}/min

## 2024-02-22 LAB — COMPREHENSIVE METABOLIC PANEL, NON-FASTING
ALBUMIN: 4.3 g/dL (ref 3.5–5.2)
ALKALINE PHOSPHATASE: 121 U/L (ref 35–129)
ALT (SGPT): 9 U/L (ref 0–41)
ANION GAP: 11 mmol/L (ref 7–18)
AST (SGOT): 12 U/L (ref 0–40)
BILIRUBIN TOTAL: 0.4 mg/dL (ref 0.2–1.2)
BUN: 13 mg/dL (ref 8–23)
CALCIUM: 10.1 mg/dL (ref 8.3–10.7)
CHLORIDE: 100 mmol/L (ref 96–106)
CO2 TOTAL: 25 mmol/L (ref 22–30)
CREATININE: 0.91 mg/dL (ref 0.70–1.20)
ESTIMATED GFR: >90 mL/min/1.73mˆ2 (ref >90)
GLUCOSE: 138 mg/dL — ABNORMAL HIGH (ref 74–109)
POTASSIUM: 4.2 mmol/L (ref 3.2–5.0)
PROTEIN TOTAL: 6.8 g/dL (ref 6.4–8.3)
SODIUM: 136 mmol/L (ref 133–144)

## 2024-02-22 LAB — CBC WITH DIFF
BASOPHIL #: <0.1 x10ˆ3/uL (ref <=0.20)
BASOPHIL %: 0.8 %
EOSINOPHIL #: 0.82 x10ˆ3/uL — ABNORMAL HIGH (ref <=0.50)
EOSINOPHIL %: 10.5 %
HCT: 27.4 % — ABNORMAL LOW (ref 38.9–52.0)
HGB: 8.2 g/dL — ABNORMAL LOW (ref 13.4–17.5)
IMMATURE GRANULOCYTE #: <0.1 x10ˆ3/uL (ref <0.10)
IMMATURE GRANULOCYTE %: 0.3 % (ref 0.0–1.0)
LYMPHOCYTE #: 2.06 x10ˆ3/uL (ref 1.00–4.80)
LYMPHOCYTE %: 26.3 %
MCH: 25.1 pg — ABNORMAL LOW (ref 26.0–32.0)
MCHC: 29.9 g/dL — ABNORMAL LOW (ref 31.0–35.5)
MCV: 83.8 fL (ref 78.0–100.0)
MONOCYTE #: 0.59 x10ˆ3/uL (ref 0.20–1.10)
MONOCYTE %: 7.5 %
MPV: 10.7 fL (ref 8.7–12.5)
NEUTROPHIL #: 4.28 x10ˆ3/uL (ref 1.50–7.70)
NEUTROPHIL %: 54.6 %
PLATELETS: 293 x10ˆ3/uL (ref 150–400)
RBC: 3.27 x10ˆ6/uL — ABNORMAL LOW (ref 4.50–6.10)
RDW-CV: 15.1 % (ref 11.5–15.5)
WBC: 7.8 x10ˆ3/uL (ref 3.7–11.0)

## 2024-02-22 LAB — URINALYSIS, MACRO/MICRO
BACTERIA: <4.5 /HPF — ABNORMAL HIGH (ref <=0)
BILIRUBIN: NEGATIVE mg/dL
LEUKOCYTES: NEGATIVE WBCs/uL
NITRITE: NEGATIVE
PH: 5.5 (ref 5.0–7.0)
RBCS: 3.7 /HPF — ABNORMAL HIGH (ref <=3.0)
SPECIFIC GRAVITY: 1.015 (ref 1.005–1.025)
SQUAMOUS EPITHELIAL CELLS: 2.2 /HPF (ref 0.0–10.0)
TOTAL CASTS: 2.8 /LPF — ABNORMAL HIGH (ref <=2.0)
UROBILINOGEN: 0.2 mg/dL
WBCS: <1.8 /HPF (ref <=4.0)

## 2024-02-22 LAB — PT/INR
INR: 1.16 — ABNORMAL HIGH (ref 0.86–1.14)
PROTHROMBIN TIME: 15.2 s (ref 12.1–15.3)

## 2024-02-22 LAB — HGA1C (HEMOGLOBIN A1C WITH EST AVG GLUCOSE)
ESTIMATED AVERAGE GLUCOSE: 131 mg/dL
HEMOGLOBIN A1C: 6.2 % — ABNORMAL HIGH (ref 4.0–6.0)

## 2024-02-22 LAB — LIPID PANEL
CHOLESTEROL: 92 mg/dL (ref 0–200)
HDL CHOL: 25 mg/dL
LDL CALC: 46 mg/dL (ref 0–99)
TRIGLYCERIDES: 112 mg/dL (ref 0–150)
VLDL CALC: 21 mg/dL (ref 0–30)

## 2024-02-22 LAB — THYROID STIMULATING HORMONE WITH FREE T4 REFLEX: TSH: 1.41 u[IU]/mL (ref 0.270–4.200)

## 2024-02-22 LAB — PTT (PARTIAL THROMBOPLASTIN TIME): APTT: 35.2 s — ABNORMAL HIGH (ref 22.4–34.8)

## 2024-02-22 MED ORDER — CEFAZOLIN 2 GRAM/100 ML IN DEXTROSE(ISO-OSMOTIC) INTRAVENOUS PIGGYBACK
INJECTION | INTRAVENOUS | Status: AC
Start: 2024-02-22 — End: 2024-02-22
  Filled 2024-02-22: qty 200

## 2024-02-22 MED ORDER — PROTAMINE 10 MG/ML INTRAVENOUS SOLUTION
INTRAVENOUS | Status: AC
Start: 2024-02-22 — End: 2024-02-22
  Filled 2024-02-22: qty 35

## 2024-02-22 MED ORDER — AMINOCAPROIC ACID 250 MG/ML INTRAVENOUS SOLUTION
INTRAVENOUS | Status: AC
Start: 2024-02-22 — End: 2024-02-22
  Filled 2024-02-22: qty 80

## 2024-02-22 MED ORDER — MAGNESIUM SULFATE 4 GRAM/100 ML (4 %) IN WATER INTRAVENOUS PIGGYBACK
INJECTION | INTRAVENOUS | Status: AC
Start: 2024-02-22 — End: 2024-02-22
  Filled 2024-02-22: qty 100

## 2024-02-22 MED ORDER — NITROGLYCERIN 50 MG/250 ML (200 MCG/ML) IN 5 % DEXTROSE INTRAVENOUS
INTRAVENOUS | Status: AC
Start: 2024-02-22 — End: 2024-02-22
  Filled 2024-02-22: qty 250

## 2024-02-22 MED ORDER — INSULIN REGULAR 100 UNIT/100 ML (1 UNIT/ML) IN 0.9 % NACL IV SOLUTION
INTRAVENOUS | Status: AC
Start: 2024-02-22 — End: 2024-02-22
  Filled 2024-02-22: qty 100

## 2024-02-22 MED ORDER — SODIUM CHLORIDE 0.9 % INTRAVENOUS SOLUTION
INTRAVENOUS | Status: AC
Start: 2024-02-22 — End: 2024-02-22
  Filled 2024-02-22: qty 10

## 2024-02-22 MED ORDER — NOREPINEPHRINE BITARTRATE 4 MG/250 ML (16 MCG/ML) IN DEXTROSE 5 % IV
INTRAVENOUS | Status: AC
Start: 2024-02-22 — End: 2024-02-22
  Filled 2024-02-22: qty 250

## 2024-02-22 MED ORDER — ACETAMINOPHEN 1,000 MG/100 ML (10 MG/ML) INTRAVENOUS SOLUTION
INTRAVENOUS | Status: AC
Start: 2024-02-22 — End: 2024-02-22
  Filled 2024-02-22: qty 100

## 2024-02-22 NOTE — Result Encounter Note (Signed)
 Abnormal ua, cbc, pt/inr, PTT, hga1c 02/22/24 forwarded to Dr. Jearlean, FNP-BC for review per dept guidelines.
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
## 2024-02-23 ENCOUNTER — Encounter (HOSPITAL_COMMUNITY): Payer: Self-pay | Admitting: Student in an Organized Health Care Education/Training Program

## 2024-02-23 ENCOUNTER — Inpatient Hospital Stay (HOSPITAL_COMMUNITY): Payer: MEDICARE

## 2024-02-23 ENCOUNTER — Encounter (HOSPITAL_COMMUNITY)
Admission: RE | Disposition: A | Discharge status: Home Health | Payer: Self-pay | Source: Ambulatory Visit | Attending: THORACIC SURGERY CARDIOTHORACIC VASCULAR SURGERY

## 2024-02-23 ENCOUNTER — Encounter (HOSPITAL_COMMUNITY): Payer: Self-pay | Admitting: THORACIC SURGERY CARDIOTHORACIC VASCULAR SURGERY

## 2024-02-23 ENCOUNTER — Inpatient Hospital Stay
Admission: RE | Admit: 2024-02-23 | Discharge: 2024-02-28 | DRG: 235 | Disposition: A | Discharge status: Home / Self Care | Payer: MEDICARE | Source: Ambulatory Visit | Attending: THORACIC SURGERY CARDIOTHORACIC VASCULAR SURGERY | Admitting: THORACIC SURGERY CARDIOTHORACIC VASCULAR SURGERY

## 2024-02-23 ENCOUNTER — Inpatient Hospital Stay (HOSPITAL_COMMUNITY): Payer: MEDICARE | Admitting: THORACIC SURGERY CARDIOTHORACIC VASCULAR SURGERY

## 2024-02-23 DIAGNOSIS — E1151 Type 2 diabetes mellitus with diabetic peripheral angiopathy without gangrene: Secondary | ICD-10-CM

## 2024-02-23 DIAGNOSIS — I2582 Chronic total occlusion of coronary artery: Secondary | ICD-10-CM

## 2024-02-23 DIAGNOSIS — L97919 Non-pressure chronic ulcer of unspecified part of right lower leg with unspecified severity: Secondary | ICD-10-CM

## 2024-02-23 DIAGNOSIS — D62 Acute posthemorrhagic anemia: Secondary | ICD-10-CM | POA: Diagnosis not present

## 2024-02-23 DIAGNOSIS — I252 Old myocardial infarction: Secondary | ICD-10-CM

## 2024-02-23 DIAGNOSIS — I440 Atrioventricular block, first degree: Secondary | ICD-10-CM

## 2024-02-23 DIAGNOSIS — Z7982 Long term (current) use of aspirin: Secondary | ICD-10-CM

## 2024-02-23 DIAGNOSIS — E1142 Type 2 diabetes mellitus with diabetic polyneuropathy: Secondary | ICD-10-CM

## 2024-02-23 DIAGNOSIS — E7800 Pure hypercholesterolemia, unspecified: Secondary | ICD-10-CM

## 2024-02-23 DIAGNOSIS — Z951 Presence of aortocoronary bypass graft: Principal | ICD-10-CM

## 2024-02-23 DIAGNOSIS — I2510 Atherosclerotic heart disease of native coronary artery without angina pectoris: Principal | ICD-10-CM | POA: Diagnosis present

## 2024-02-23 DIAGNOSIS — Z01810 Encounter for preprocedural cardiovascular examination: Secondary | ICD-10-CM

## 2024-02-23 DIAGNOSIS — J9811 Atelectasis: Secondary | ICD-10-CM | POA: Diagnosis not present

## 2024-02-23 DIAGNOSIS — G8929 Other chronic pain: Secondary | ICD-10-CM | POA: Diagnosis present

## 2024-02-23 DIAGNOSIS — R571 Hypovolemic shock: Secondary | ICD-10-CM | POA: Diagnosis not present

## 2024-02-23 DIAGNOSIS — Z7984 Long term (current) use of oral hypoglycemic drugs: Secondary | ICD-10-CM

## 2024-02-23 DIAGNOSIS — Z79899 Other long term (current) drug therapy: Secondary | ICD-10-CM

## 2024-02-23 DIAGNOSIS — D649 Anemia, unspecified: Secondary | ICD-10-CM

## 2024-02-23 DIAGNOSIS — L97519 Non-pressure chronic ulcer of other part of right foot with unspecified severity: Secondary | ICD-10-CM | POA: Diagnosis present

## 2024-02-23 DIAGNOSIS — Z9889 Other specified postprocedural states: Secondary | ICD-10-CM

## 2024-02-23 DIAGNOSIS — I378 Other nonrheumatic pulmonary valve disorders: Secondary | ICD-10-CM

## 2024-02-23 DIAGNOSIS — E872 Acidosis, unspecified: Secondary | ICD-10-CM | POA: Diagnosis not present

## 2024-02-23 DIAGNOSIS — T148XXA Other injury of unspecified body region, initial encounter: Secondary | ICD-10-CM | POA: Diagnosis present

## 2024-02-23 DIAGNOSIS — K219 Gastro-esophageal reflux disease without esophagitis: Secondary | ICD-10-CM | POA: Diagnosis present

## 2024-02-23 DIAGNOSIS — I351 Nonrheumatic aortic (valve) insufficiency: Secondary | ICD-10-CM | POA: Diagnosis present

## 2024-02-23 DIAGNOSIS — Z7902 Long term (current) use of antithrombotics/antiplatelets: Secondary | ICD-10-CM

## 2024-02-23 DIAGNOSIS — I97110 Postprocedural cardiac insufficiency following cardiac surgery: Secondary | ICD-10-CM | POA: Diagnosis not present

## 2024-02-23 DIAGNOSIS — R339 Retention of urine, unspecified: Secondary | ICD-10-CM | POA: Diagnosis not present

## 2024-02-23 DIAGNOSIS — I70239 Atherosclerosis of native arteries of right leg with ulceration of unspecified site: Secondary | ICD-10-CM

## 2024-02-23 DIAGNOSIS — M4803 Spinal stenosis, cervicothoracic region: Secondary | ICD-10-CM | POA: Diagnosis present

## 2024-02-23 DIAGNOSIS — E1140 Type 2 diabetes mellitus with diabetic neuropathy, unspecified: Secondary | ICD-10-CM | POA: Diagnosis present

## 2024-02-23 DIAGNOSIS — I10 Essential (primary) hypertension: Secondary | ICD-10-CM

## 2024-02-23 DIAGNOSIS — Z8249 Family history of ischemic heart disease and other diseases of the circulatory system: Secondary | ICD-10-CM

## 2024-02-23 DIAGNOSIS — R9431 Abnormal electrocardiogram [ECG] [EKG]: Secondary | ICD-10-CM

## 2024-02-23 DIAGNOSIS — M549 Dorsalgia, unspecified: Secondary | ICD-10-CM | POA: Diagnosis present

## 2024-02-23 DIAGNOSIS — Z48812 Encounter for surgical aftercare following surgery on the circulatory system: Secondary | ICD-10-CM

## 2024-02-23 DIAGNOSIS — N179 Acute kidney failure, unspecified: Secondary | ICD-10-CM | POA: Diagnosis not present

## 2024-02-23 DIAGNOSIS — R001 Bradycardia, unspecified: Secondary | ICD-10-CM | POA: Diagnosis not present

## 2024-02-23 HISTORY — DX: Spinal stenosis, cervicothoracic region: M48.03

## 2024-02-23 HISTORY — DX: Atherosclerotic heart disease of native coronary artery without angina pectoris: I25.10

## 2024-02-23 HISTORY — DX: Body mass index (BMI) 27.0-27.9, adult: Z68.27

## 2024-02-23 HISTORY — DX: Anesthesia of skin: R20.0

## 2024-02-23 LAB — POC BLOOD GLUCOSE (RESULTS)
GLUCOSE, POC: 165 mg/dL — ABNORMAL HIGH (ref 70–100)
GLUCOSE, POC: 159 mg/dL — ABNORMAL HIGH (ref 70–100)
GLUCOSE, POC: 177 mg/dL — ABNORMAL HIGH (ref 70–100)
GLUCOSE, POC: 163 mg/dL — ABNORMAL HIGH (ref 70–100)
GLUCOSE, POC: 149 mg/dL — ABNORMAL HIGH (ref 70–100)
GLUCOSE, POC: 146 mg/dL — ABNORMAL HIGH (ref 70–100)
GLUCOSE, POC: 131 mg/dL — ABNORMAL HIGH (ref 70–100)
GLUCOSE, POC: 121 mg/dL — ABNORMAL HIGH (ref 70–100)
GLUCOSE, POC: 103 mg/dL — ABNORMAL HIGH (ref 70–100)
GLUCOSE, POC: 142 mg/dL — ABNORMAL HIGH (ref 70–100)

## 2024-02-23 LAB — POC ARTERIAL BLOOD GAS HVI (RESULT)
%FIO2 (ARTERIAL): 100 %
%FIO2 (ARTERIAL): 100 %
%FIO2 (ARTERIAL): 85 %
%FIO2 (ARTERIAL): 75 %
%FIO2 (ARTERIAL): 75 %
%FIO2 (ARTERIAL): 85 %
%FIO2 (ARTERIAL): 100 %
%FIO2 (ARTERIAL): 100 %
(T) PCO2: 39 mmHg (ref 35.0–45.0)
(T) PCO2: 44 mmHg (ref 35.0–45.0)
(T) PCO2: 48 mmHg — ABNORMAL HIGH (ref 35.0–45.0)
(T) PCO2: 42 mmHg (ref 35.0–45.0)
(T) PCO2: 44 mmHg (ref 35.0–45.0)
(T) PCO2: 44 mmHg (ref 35.0–45.0)
(T) PCO2: 41 mmHg (ref 35.0–45.0)
(T) PCO2: 44 mmHg (ref 35.0–45.0)
(T) PO2: 199 mmHg — ABNORMAL HIGH (ref 83.0–108.0)
(T) PO2: 218 mmHg — ABNORMAL HIGH (ref 83.0–108.0)
(T) PO2: 431 mmHg — ABNORMAL HIGH (ref 83.0–108.0)
(T) PO2: 294 mmHg — ABNORMAL HIGH (ref 83.0–108.0)
(T) PO2: 362 mmHg — ABNORMAL HIGH (ref 83.0–108.0)
(T) PO2: 339 mmHg — ABNORMAL HIGH (ref 83.0–108.0)
(T) PO2: 155 mmHg — ABNORMAL HIGH (ref 83.0–108.0)
(T) PO2: 163 mmHg — ABNORMAL HIGH (ref 83.0–108.0)
BASE EXCESS (ARTERIAL): 3.6 mmol/L
BASE EXCESS (ARTERIAL): 1.5 mmol/L
BASE EXCESS (ARTERIAL): 3.7 mmol/L
BASE EXCESS (ARTERIAL): 2.2 mmol/L
BASE EXCESS (ARTERIAL): 2.5 mmol/L
BASE EXCESS (ARTERIAL): 1.4 mmol/L
BASE EXCESS (ARTERIAL): 0 mmol/L
BASE EXCESS (ARTERIAL): 1.2 mmol/L
BICARBONATE (ARTERIAL): 27.7 mmol/L (ref 21.0–28.0)
BICARBONATE (ARTERIAL): 26.1 mmol/L (ref 21.0–28.0)
BICARBONATE (ARTERIAL): 27.8 mmol/L (ref 21.0–28.0)
BICARBONATE (ARTERIAL): 26.7 mmol/L (ref 21.0–28.0)
BICARBONATE (ARTERIAL): 26.9 mmol/L (ref 21.0–28.0)
BICARBONATE (ARTERIAL): 26.1 mmol/L (ref 21.0–28.0)
BICARBONATE (ARTERIAL): 24.9 mmol/L (ref 21.0–28.0)
BICARBONATE (ARTERIAL): 25.9 mmol/L (ref 21.0–28.0)
CARBOXYHEMOGLOBIN: 1.9 % (ref <=3.0)
CARBOXYHEMOGLOBIN: 1.6 % (ref <=3.0)
CARBOXYHEMOGLOBIN: 1.7 % (ref <=3.0)
CARBOXYHEMOGLOBIN: 1.1 % (ref <=3.0)
CARBOXYHEMOGLOBIN: 1.8 % (ref <=3.0)
CARBOXYHEMOGLOBIN: 1.7 % (ref <=3.0)
CARBOXYHEMOGLOBIN: 1.9 % (ref <=3.0)
CARBOXYHEMOGLOBIN: 1.9 % (ref <=3.0)
CHLORIDE: 109 mmol/L — ABNORMAL HIGH (ref 98–107)
CHLORIDE: 108 mmol/L — ABNORMAL HIGH (ref 98–107)
CHLORIDE: 109 mmol/L — ABNORMAL HIGH (ref 98–107)
CHLORIDE: 109 mmol/L — ABNORMAL HIGH (ref 98–107)
CHLORIDE: 110 mmol/L — ABNORMAL HIGH (ref 98–107)
CHLORIDE: 109 mmol/L — ABNORMAL HIGH (ref 98–107)
CHLORIDE: 110 mmol/L — ABNORMAL HIGH (ref 98–107)
CHLORIDE: 110 mmol/L — ABNORMAL HIGH (ref 98–107)
GLUCOSE: 160 mg/dL — ABNORMAL HIGH (ref 65–125)
GLUCOSE: 148 mg/dL — ABNORMAL HIGH (ref 65–125)
GLUCOSE: 154 mg/dL — ABNORMAL HIGH (ref 65–125)
GLUCOSE: 146 mg/dL — ABNORMAL HIGH (ref 65–125)
GLUCOSE: 159 mg/dL — ABNORMAL HIGH (ref 65–125)
GLUCOSE: 182 mg/dL — ABNORMAL HIGH (ref 65–125)
GLUCOSE: 138 mg/dL — ABNORMAL HIGH (ref 65–125)
GLUCOSE: 158 mg/dL — ABNORMAL HIGH (ref 65–125)
HEMATOCRITRT: 23 % — ABNORMAL LOW (ref 37–50)
HEMATOCRITRT: 20 % — ABNORMAL LOW (ref 37–50)
HEMATOCRITRT: 22 % — ABNORMAL LOW (ref 37–50)
HEMATOCRITRT: 23 % — ABNORMAL LOW (ref 37–50)
HEMATOCRITRT: 23 % — ABNORMAL LOW (ref 37–50)
HEMATOCRITRT: 22 % — ABNORMAL LOW (ref 37–50)
HEMATOCRITRT: 22 % — ABNORMAL LOW (ref 37–50)
HEMATOCRITRT: 28 % — ABNORMAL LOW (ref 37–50)
HEMOGLOBIN: 7.7 g/dL — ABNORMAL LOW (ref 12.0–18.0)
HEMOGLOBIN: 6.8 g/dL — CL (ref 12.0–18.0)
HEMOGLOBIN: 7.2 g/dL — ABNORMAL LOW (ref 12.0–18.0)
HEMOGLOBIN: 7.5 g/dL — ABNORMAL LOW (ref 12.0–18.0)
HEMOGLOBIN: 7.7 g/dL — ABNORMAL LOW (ref 12.0–18.0)
HEMOGLOBIN: 7.4 g/dL — ABNORMAL LOW (ref 12.0–18.0)
HEMOGLOBIN: 7.2 g/dL — ABNORMAL LOW (ref 12.0–18.0)
HEMOGLOBIN: 9.4 g/dL — ABNORMAL LOW (ref 12.0–18.0)
IONIZED CALCIUM: 1.28 mmol/L (ref 1.15–1.33)
IONIZED CALCIUM: 1.15 mmol/L (ref 1.15–1.33)
IONIZED CALCIUM: 1.27 mmol/L (ref 1.15–1.33)
IONIZED CALCIUM: 1.19 mmol/L (ref 1.15–1.33)
IONIZED CALCIUM: 1.21 mmol/L (ref 1.15–1.33)
IONIZED CALCIUM: 1.21 mmol/L (ref 1.15–1.33)
IONIZED CALCIUM: 1.31 mmol/L (ref 1.15–1.33)
IONIZED CALCIUM: 1.38 mmol/L — ABNORMAL HIGH (ref 1.15–1.33)
LACTATE: 0.8 mmol/L (ref <=1.9)
LACTATE: 0.9 mmol/L (ref <=1.9)
LACTATE: 1.3 mmol/L (ref <=1.9)
LACTATE: 1.1 mmol/L (ref <=1.9)
LACTATE: 1.2 mmol/L (ref <=1.9)
LACTATE: 2 mmol/L — ABNORMAL HIGH (ref <=1.9)
LACTATE: 1.7 mmol/L (ref <=1.9)
LACTATE: 1.9 mmol/L (ref <=1.9)
MET-HEMOGLOBIN: 1 % (ref <=1.5)
MET-HEMOGLOBIN: 1.5 % (ref <=1.5)
MET-HEMOGLOBIN: <0.7 % (ref <=1.5)
MET-HEMOGLOBIN: 0.8 % (ref <=1.5)
MET-HEMOGLOBIN: <0.7 % (ref <=1.5)
MET-HEMOGLOBIN: 0.8 % (ref <=1.5)
MET-HEMOGLOBIN: 0.7 % (ref <=1.5)
MET-HEMOGLOBIN: 0.7 % (ref <=1.5)
O2 SATURATION (ARTERIAL): 99.1 % (ref 94.0–98.0)
O2 SATURATION (ARTERIAL): 99.4 % (ref 94.0–98.0)
O2 SATURATION (ARTERIAL): 99.8 % (ref 94.0–98.0)
O2 SATURATION (ARTERIAL): 100 % (ref 94.0–98.0)
O2 SATURATION (ARTERIAL): 98.5 % (ref 94.0–98.0)
O2 SATURATION (ARTERIAL): 99.7 % (ref 94.0–98.0)
O2 SATURATION (ARTERIAL): 99.5 % (ref 94.0–98.0)
O2 SATURATION (ARTERIAL): 99.7 % (ref 94.0–98.0)
O2CT: 10.9 %
O2CT: 11 %
O2CT: 9.9 %
O2CT: 11.3 %
O2CT: 11.3 %
O2CT: 11.1 %
O2CT: 10.2 %
O2CT: 13.2 %
OXYHEMOGLOBIN: 96.2 % — ABNORMAL HIGH (ref 90.0–95.0)
OXYHEMOGLOBIN: 96.7 % — ABNORMAL HIGH (ref 90.0–95.0)
OXYHEMOGLOBIN: 97.7 % — ABNORMAL HIGH (ref 90.0–95.0)
OXYHEMOGLOBIN: 96.6 % — ABNORMAL HIGH (ref 90.0–95.0)
OXYHEMOGLOBIN: 97.7 % — ABNORMAL HIGH (ref 90.0–95.0)
OXYHEMOGLOBIN: 97.2 % — ABNORMAL HIGH (ref 90.0–95.0)
OXYHEMOGLOBIN: 97 % — ABNORMAL HIGH (ref 90.0–95.0)
OXYHEMOGLOBIN: 97 % — ABNORMAL HIGH (ref 90.0–95.0)
PAO2/FIO2 RATIO: 199
PAO2/FIO2 RATIO: 218
PAO2/FIO2 RATIO: 507
PAO2/FIO2 RATIO: 392
PAO2/FIO2 RATIO: 483
PAO2/FIO2 RATIO: 399
PAO2/FIO2 RATIO: 155
PAO2/FIO2 RATIO: 163
PCO2 (ARTERIAL): 39 mmHg (ref 35–45)
PCO2 (ARTERIAL): 44 mmHg (ref 35–45)
PCO2 (ARTERIAL): 48 mmHg — ABNORMAL HIGH (ref 35–45)
PCO2 (ARTERIAL): 42 mmHg (ref 35–45)
PCO2 (ARTERIAL): 44 mmHg (ref 35–45)
PCO2 (ARTERIAL): 44 mmHg (ref 35–45)
PCO2 (ARTERIAL): 41 mmHg (ref 35–45)
PCO2 (ARTERIAL): 44 mmHg (ref 35–45)
PH (ARTERIAL): 7.46 — ABNORMAL HIGH (ref 7.35–7.45)
PH (ARTERIAL): 7.36 (ref 7.35–7.45)
PH (ARTERIAL): 7.42 (ref 7.35–7.45)
PH (ARTERIAL): 7.4 (ref 7.35–7.45)
PH (ARTERIAL): 7.42 (ref 7.35–7.45)
PH (ARTERIAL): 7.39 (ref 7.35–7.45)
PH (ARTERIAL): 7.37 (ref 7.35–7.45)
PH (ARTERIAL): 7.41 (ref 7.35–7.45)
PH (T): 7.46 — ABNORMAL HIGH (ref 7.35–7.45)
PH (T): 7.36 (ref 7.35–7.45)
PH (T): 7.42 (ref 7.35–7.45)
PH (T): 7.4 (ref 7.35–7.45)
PH (T): 7.42 (ref 7.35–7.45)
PH (T): 7.39 (ref 7.35–7.45)
PH (T): 7.37 (ref 7.35–7.45)
PH (T): 7.41 (ref 7.35–7.45)
PO2 (ARTERIAL): 199 mmHg — ABNORMAL HIGH (ref 83–108)
PO2 (ARTERIAL): 218 mmHg — ABNORMAL HIGH (ref 83–108)
PO2 (ARTERIAL): 431 mmHg — ABNORMAL HIGH (ref 83–108)
PO2 (ARTERIAL): 294 mmHg — ABNORMAL HIGH (ref 83–108)
PO2 (ARTERIAL): 362 mmHg — ABNORMAL HIGH (ref 83–108)
PO2 (ARTERIAL): 339 mmHg — ABNORMAL HIGH (ref 83–108)
PO2 (ARTERIAL): 155 mmHg — ABNORMAL HIGH (ref 83–108)
PO2 (ARTERIAL): 163 mmHg — ABNORMAL HIGH (ref 83–108)
SODIUM: 135 mmol/L — ABNORMAL LOW (ref 136–145)
SODIUM: 134 mmol/L — ABNORMAL LOW (ref 136–145)
SODIUM: 134 mmol/L — ABNORMAL LOW (ref 136–145)
SODIUM: 136 mmol/L (ref 136–145)
SODIUM: 136 mmol/L (ref 136–145)
SODIUM: 136 mmol/L (ref 136–145)
SODIUM: 135 mmol/L — ABNORMAL LOW (ref 136–145)
SODIUM: 136 mmol/L (ref 136–145)
WHOLE BLOOD POTASSIUM: 4.2 mmol/L (ref 3.5–5.1)
WHOLE BLOOD POTASSIUM: 4.2 mmol/L (ref 3.5–5.1)
WHOLE BLOOD POTASSIUM: 5.7 mmol/L — ABNORMAL HIGH (ref 3.5–5.1)
WHOLE BLOOD POTASSIUM: 5.2 mmol/L — ABNORMAL HIGH (ref 3.5–5.1)
WHOLE BLOOD POTASSIUM: 5.5 mmol/L — ABNORMAL HIGH (ref 3.5–5.1)
WHOLE BLOOD POTASSIUM: 5.2 mmol/L — ABNORMAL HIGH (ref 3.5–5.1)
WHOLE BLOOD POTASSIUM: 4.3 mmol/L (ref 3.5–5.1)
WHOLE BLOOD POTASSIUM: 4.6 mmol/L (ref 3.5–5.1)

## 2024-02-23 LAB — POCT ISTAT CG8 BLOOD GAS ELECTROLYTES HEMATOCRIT (RESULTS)
BASE EXCESS (BEP): -1 meq/L (ref <=2.0)
BASE EXCESS (BEP): 0 meq/L (ref <=2.0)
GLUCOSE, POC: 147 mg/dL — ABNORMAL HIGH (ref 70–110)
GLUCOSE, POC: 175 mg/dL — ABNORMAL HIGH (ref 70–110)
HCO3 (HCO3P): 23 mmol/L (ref 21–29)
HCO3 (HCO3P): 24 mmol/L (ref 21–29)
HEMATOCRIT, POC: 24 % — ABNORMAL LOW (ref 34.0–52.0)
HEMATOCRIT, POC: 21 % — ABNORMAL LOW (ref 34.0–52.0)
HEMOGLOBIN, POC: 8.2 g/dL — ABNORMAL LOW (ref 11.2–18.0)
HEMOGLOBIN, POC: 7.1 g/dL — ABNORMAL LOW (ref 11.2–18.0)
IONIZED CALCIUM, POC: 1.35 mmol/L — ABNORMAL HIGH (ref 1.09–1.32)
IONIZED CALCIUM, POC: 1.29 mmol/L (ref 1.09–1.32)
PCO2 (PCO2P): 33 mmHg — ABNORMAL LOW (ref 41–51)
PCO2 (PCO2P): 35 mmHg — ABNORMAL LOW (ref 41–51)
PH (PHP): 7.44 (ref 7.38–7.46)
PH (PHP): 7.44 (ref 7.38–7.46)
PO2 (PO2P): 195 mmHg — ABNORMAL HIGH (ref 74–108)
PO2 (PO2P): 169 mmHg — ABNORMAL HIGH (ref 74–108)
POTASSIUM, POC: 4.4 mmol/L (ref 3.2–5.0)
POTASSIUM, POC: 4.6 mmol/L (ref 3.2–5.0)
SO2 (SO2P): 100 % — ABNORMAL HIGH (ref 92–96)
SO2 (SO2P): 100 % — ABNORMAL HIGH (ref 92–96)
SODIUM, POC: 139 mmol/L (ref 133–144)
SODIUM, POC: 139 mmol/L (ref 133–144)
TCO2 (TOC2P): 24 mmol/L (ref 22–32)
TCO2 (TOC2P): 25 mmol/L (ref 22–32)

## 2024-02-23 LAB — BASIC METABOLIC PANEL
ANION GAP: 8 mmol/L (ref 7–18)
BUN/CREA RATIO: 20
BUN: 20 mg/dL (ref 8–23)
CALCIUM: 8.9 mg/dL (ref 8.3–10.7)
CHLORIDE: 107 mmol/L — ABNORMAL HIGH (ref 96–106)
CO2 TOTAL: 22 mmol/L (ref 22–30)
CREATININE: 0.98 mg/dL (ref 0.70–1.20)
ESTIMATED GFR: 85 mL/min/1.73mˆ2 — ABNORMAL LOW (ref >90)
GLUCOSE: 123 mg/dL — ABNORMAL HIGH (ref 74–109)
POTASSIUM: 5.1 mmol/L — ABNORMAL HIGH (ref 3.2–5.0)
SODIUM: 137 mmol/L (ref 133–144)

## 2024-02-23 LAB — BLOOD GAS/CO-OX
%FIO2 (ARTERIAL): 35 %
(T) PCO2: 41 mmHg (ref 35.0–45.0)
(T) PO2: 194 mmHg — ABNORMAL HIGH (ref 83.0–108.0)
BASE EXCESS (ARTERIAL): 1.2 mmol/L (ref 0.0–3.0)
BICARBONATE (ARTERIAL): 25.9 mmol/L (ref 21.0–28.0)
CARBOXYHEMOGLOBIN: 1.3 % (ref <=3.0)
HEMATOCRITRT: 26 % — ABNORMAL LOW (ref 37–50)
HEMOGLOBIN: 8.7 g/dL — ABNORMAL LOW (ref 12.0–18.0)
MET-HEMOGLOBIN: 1.1 % (ref <=1.5)
O2 SATURATION (ARTERIAL): 99.7 % (ref 94.0–98.0)
O2CT: 12.3 %
OXYHEMOGLOBIN: 96.8 % — ABNORMAL HIGH (ref 90.0–95.0)
PAO2/FIO2 RATIO: 554
PCO2 (ARTERIAL): 41 mmHg (ref 35–45)
PH (ARTERIAL): 7.41 (ref 7.35–7.45)
PH (T): 7.41 (ref 7.35–7.45)
PO2 (ARTERIAL): 194 mmHg — ABNORMAL HIGH (ref 83–108)
TEMPERATURE, COMP: 37 C (ref 15.0–40.0)

## 2024-02-23 LAB — CBC WITH DIFF
BASOPHIL #: <0.1 x10ˆ3/uL (ref <=0.20)
BASOPHIL %: 0.6 %
EOSINOPHIL #: 0.15 x10ˆ3/uL (ref <=0.50)
EOSINOPHIL %: 1.2 %
HCT: 28.1 % — ABNORMAL LOW (ref 38.9–52.0)
HGB: 8.9 g/dL — ABNORMAL LOW (ref 13.4–17.5)
IMMATURE GRANULOCYTE #: <0.1 x10ˆ3/uL (ref <0.10)
IMMATURE GRANULOCYTE %: 0.6 % (ref 0.0–1.0)
LYMPHOCYTE #: 1.08 x10ˆ3/uL (ref 1.00–4.80)
LYMPHOCYTE %: 8.5 %
MCH: 27 pg (ref 26.0–32.0)
MCHC: 31.7 g/dL (ref 31.0–35.5)
MCV: 85.2 fL (ref 78.0–100.0)
MONOCYTE #: 1.1 x10ˆ3/uL (ref 0.20–1.10)
MONOCYTE %: 8.7 %
MPV: 9.9 fL (ref 8.7–12.5)
NEUTROPHIL #: 10.2 x10ˆ3/uL — ABNORMAL HIGH (ref 1.50–7.70)
NEUTROPHIL %: 80.4 %
PLATELETS: 178 x10ˆ3/uL (ref 150–400)
RBC: 3.3 x10ˆ6/uL — ABNORMAL LOW (ref 4.50–6.10)
RDW-CV: 15.1 % (ref 11.5–15.5)
WBC: 12.7 x10ˆ3/uL — ABNORMAL HIGH (ref 3.7–11.0)

## 2024-02-23 LAB — COMPREHENSIVE METABOLIC PANEL, NON-FASTING
ALBUMIN: 2.9 g/dL — ABNORMAL LOW (ref 3.5–5.2)
ALKALINE PHOSPHATASE: 85 U/L (ref 35–129)
ALT (SGPT): 8 U/L (ref 0–41)
ANION GAP: 9 mmol/L (ref 7–18)
AST (SGOT): 30 U/L (ref 0–40)
BILIRUBIN TOTAL: 0.5 mg/dL (ref 0.2–1.2)
BUN: 15 mg/dL (ref 8–23)
CALCIUM: 9.3 mg/dL (ref 8.3–10.7)
CHLORIDE: 109 mmol/L — ABNORMAL HIGH (ref 96–106)
CO2 TOTAL: 23 mmol/L (ref 22–30)
CREATININE: 0.94 mg/dL (ref 0.70–1.20)
ESTIMATED GFR: 89 mL/min/1.73mˆ2 — ABNORMAL LOW (ref >90)
GLUCOSE: 147 mg/dL — ABNORMAL HIGH (ref 74–109)
POTASSIUM: 4.7 mmol/L (ref 3.2–5.0)
PROTEIN TOTAL: 4.5 g/dL — ABNORMAL LOW (ref 6.4–8.3)
SODIUM: 141 mmol/L (ref 133–144)

## 2024-02-23 LAB — H & H
HCT: 30.5 % — ABNORMAL LOW (ref 38.9–52.0)
HGB: 9.7 g/dL — ABNORMAL LOW (ref 13.4–17.5)

## 2024-02-23 LAB — POC VENOUS BLOOD GAS HVI (RESULT)
%FIO2 (VENOUS): 85 %
(T) PCO2: 52 mmHg — ABNORMAL HIGH (ref 41.0–51.0)
(T) PO2: 50 mmHg (ref 35–50)
BASE EXCESS: 3.5 mmol/L
BICARBONATE (VENOUS): 27.5 mmol/L (ref 22.0–29.0)
CARBOXYHEMOGLOBIN: 2.9 % (ref <=3.0)
CHLORIDE: 108 mmol/L — ABNORMAL HIGH (ref 98–107)
GLUCOSE: 150 mg/dL — ABNORMAL HIGH (ref 65–125)
HEMATOCRITRT: 21 % — ABNORMAL LOW (ref 37–50)
HEMOGLOBIN: 7.1 g/dL — ABNORMAL LOW (ref 12.0–18.0)
IONIZED CALCIUM: 1.18 mmol/L (ref 1.15–1.33)
LACTATE: 1.3 mmol/L (ref <=1.9)
MET-HEMOGLOBIN: 1 % (ref <=1.5)
O2 SATURATION (VENOUS): 90 % (ref 40.0–85.0)
O2CT: 8.7 %
OXYHEMOGLOBIN: 86.5 % (ref 40.0–80.0)
PCO2 (VENOUS): 52 mmHg — ABNORMAL HIGH (ref 41–51)
PH (T): 7.36 (ref 7.32–7.43)
PH (VENOUS): 7.36 (ref 7.32–7.43)
PO2 (VENOUS): 50 mmHg (ref 35–50)
SODIUM: 135 mmol/L — ABNORMAL LOW (ref 136–145)
WHOLE BLOOD POTASSIUM: 5.5 mmol/L — ABNORMAL HIGH (ref 3.5–5.1)

## 2024-02-23 LAB — CBC
HCT: 25.3 % — ABNORMAL LOW (ref 38.9–52.0)
HGB: 8 g/dL — ABNORMAL LOW (ref 13.4–17.5)
MCH: 26.3 pg (ref 26.0–32.0)
MCHC: 31.6 g/dL (ref 31.0–35.5)
MCV: 83.2 fL (ref 78.0–100.0)
MPV: 10.4 fL (ref 8.7–12.5)
PLATELETS: 174 x10ˆ3/uL (ref 150–400)
RBC: 3.04 x10ˆ6/uL — ABNORMAL LOW (ref 4.50–6.10)
RDW-CV: 14.8 % (ref 11.5–15.5)
WBC: 10.7 x10ˆ3/uL (ref 3.7–11.0)

## 2024-02-23 LAB — IONIZED CALCIUM WITH PH
IONIZED CALCIUM: 1.3 mmol/L (ref 1.15–1.33)
IONIZED CALCIUM: 1.25 mmol/L (ref 1.15–1.33)
PH (VENOUS): 7.41 (ref 7.32–7.43)
PH (VENOUS): 7.38 (ref 7.32–7.43)

## 2024-02-23 LAB — POC ACT PLUS (RESULTS)
ACT PLUS POC: 112 s
ACT PLUS POC: 498 s — ABNORMAL HIGH
ACT PLUS POC: 500 s — ABNORMAL HIGH
ACT PLUS POC: 444 s — ABNORMAL HIGH
ACT PLUS POC: 517 s — ABNORMAL HIGH
ACT PLUS POC: 471 s — ABNORMAL HIGH
ACT PLUS POC: 107 s

## 2024-02-23 LAB — PT/INR
INR: 1.38 — ABNORMAL HIGH (ref 0.86–1.14)
PROTHROMBIN TIME: 17.3 s — ABNORMAL HIGH (ref 12.1–15.3)

## 2024-02-23 LAB — MAGNESIUM: MAGNESIUM: 3.4 mg/dL — ABNORMAL HIGH (ref 1.6–2.4)

## 2024-02-23 LAB — ABO & RH: ABO/RH(D): B POS

## 2024-02-23 LAB — ECG 12 LEAD
Atrial Rate: 31 {beats}/min
Calculated P Axis: 37 degrees
Calculated R Axis: -55 degrees
Calculated T Axis: 71 degrees
PR Interval: 224 ms
QRS Duration: 102 ms
QT Interval: 444 ms
QTC Calculation: 318 ms
Ventricular rate: 31 {beats}/min

## 2024-02-23 LAB — LACTIC ACID LEVEL W/ REFLEX FOR LEVEL >2.0: LACTIC ACID: 2.7 mmol/L — ABNORMAL HIGH (ref 0.5–2.0)

## 2024-02-23 LAB — LACTIC ACID - FIRST REFLEX: LACTIC ACID: 1.3 mmol/L (ref 0.5–2.0)

## 2024-02-23 SURGERY — BYPASS GRAFT CORONARY ARTERY
Anesthesia: General | Site: Chest | Wound class: Clean Wound: Uninfected operative wounds in which no inflammation occurred

## 2024-02-23 MED ORDER — ASCORBIC ACID (VITAMIN C) 500 MG TABLET
2000.0000 mg | ORAL_TABLET | Freq: Once | ORAL | Status: AC
Start: 2024-02-23 — End: 2024-02-23
  Administered 2024-02-23: 2000 mg via ORAL
  Filled 2024-02-23 (×2): qty 4

## 2024-02-23 MED ORDER — PAPAVERINE 30 MG/ML INJECTION SOLUTION
INTRAMUSCULAR | Status: AC
Start: 2024-02-23 — End: 2024-02-23
  Filled 2024-02-23: qty 2

## 2024-02-23 MED ORDER — MIDAZOLAM 1 MG/ML INJECTION WRAPPER
INTRAMUSCULAR | Status: AC
Start: 2024-02-23 — End: 2024-02-23
  Filled 2024-02-23: qty 2

## 2024-02-23 MED ORDER — CARVEDILOL 25 MG TABLET
50.0000 mg | ORAL_TABLET | Freq: Two times a day (BID) | ORAL | Status: DC
Start: 2024-02-23 — End: 2024-02-28

## 2024-02-23 MED ORDER — SODIUM CHLORIDE 0.9 % INTRAVENOUS SOLUTION
INTRAVENOUS | Status: AC
Start: 2024-02-23 — End: 2024-02-24
  Administered 2024-02-24: 0 mL via INTRAVENOUS

## 2024-02-23 MED ORDER — SODIUM CHLORIDE 0.9 % (FLUSH) INJECTION SYRINGE
3.0000 mL | INJECTION | Freq: Three times a day (TID) | INTRAMUSCULAR | Status: DC
Start: 2024-02-23 — End: 2024-02-28
  Administered 2024-02-23 (×2): 0 mL
  Administered 2024-02-24 (×2): 3 mL
  Administered 2024-02-24: 0 mL
  Administered 2024-02-25: 3 mL
  Administered 2024-02-25: 0 mL
  Administered 2024-02-25 – 2024-02-26 (×4): 3 mL
  Administered 2024-02-27: 0 mL
  Administered 2024-02-27 (×2): 3 mL
  Administered 2024-02-28: 0 mL

## 2024-02-23 MED ORDER — MUPIROCIN 2 % TOPICAL OINTMENT
TOPICAL_OINTMENT | Freq: Two times a day (BID) | CUTANEOUS | Status: AC
Start: 2024-02-23 — End: 2024-02-28
  Administered 2024-02-27: 0 g via TOPICAL
  Filled 2024-02-23 (×2): qty 22

## 2024-02-23 MED ORDER — LACTATED RINGERS INTRAVENOUS SOLUTION
INTRAVENOUS | Status: DC | PRN
Start: 2024-02-23 — End: 2024-02-23
  Administered 2024-02-23: 0 mL via INTRAVENOUS

## 2024-02-23 MED ORDER — DOCUSATE SODIUM 100 MG CAPSULE
100.0000 mg | ORAL_CAPSULE | Freq: Two times a day (BID) | ORAL | Status: DC
Start: 2024-02-23 — End: 2024-02-28
  Administered 2024-02-23: 0 mg via ORAL
  Administered 2024-02-23 – 2024-02-26 (×7): 100 mg via ORAL
  Administered 2024-02-27 (×2): 0 mg via ORAL
  Administered 2024-02-28: 100 mg via ORAL
  Filled 2024-02-23 (×12): qty 1

## 2024-02-23 MED ORDER — HEPARIN (PORCINE) (PF) 2,000 UNIT/1,000 ML IN 0.9 % SODIUM CHLORIDE IV
INTRAVENOUS | Status: AC
Start: 2024-02-23 — End: 2024-02-23
  Filled 2024-02-23: qty 1000

## 2024-02-23 MED ORDER — CEFAZOLIN 1 GRAM SOLUTION FOR INJECTION
Freq: Once | INTRAMUSCULAR | Status: DC | PRN
Start: 2024-02-23 — End: 2024-02-23
  Administered 2024-02-23: 2 g

## 2024-02-23 MED ORDER — HYDROMORPHONE 1 MG/ML INJECTION WRAPPER
INJECTION | Freq: Once | INTRAMUSCULAR | Status: DC | PRN
Start: 2024-02-23 — End: 2024-02-23
  Administered 2024-02-23 (×2): 1 mg via INTRAVENOUS

## 2024-02-23 MED ORDER — VASOPRESSIN 20 UNIT/ML INTRAVENOUS SOLUTION
Freq: Once | INTRAVENOUS | Status: DC | PRN
Start: 2024-02-23 — End: 2024-02-23
  Administered 2024-02-23: 2 [IU] via INTRAVENOUS

## 2024-02-23 MED ORDER — INSULIN REGULAR 100 UNIT/100 ML (1 UNIT/ML) IN 0.9 % NACL IV SOLUTION
INTRAVENOUS | Status: DC | PRN
Start: 2024-02-23 — End: 2024-02-23
  Administered 2024-02-23: 5 [IU]/h via INTRAVENOUS
  Administered 2024-02-23: 0 [IU]/h via INTRAVENOUS
  Administered 2024-02-23: 2 [IU]/h via INTRAVENOUS
  Administered 2024-02-23: 3 [IU]/h via INTRAVENOUS

## 2024-02-23 MED ORDER — HEPARIN (PORCINE) (PF) 2,000 UNIT/1,000 ML IN 0.9 % SODIUM CHLORIDE IV
Freq: Once | INTRAVENOUS | Status: DC | PRN
Start: 2024-02-23 — End: 2024-02-23
  Administered 2024-02-23: 2000 [IU]

## 2024-02-23 MED ORDER — ACETAMINOPHEN 325 MG TABLET
650.0000 mg | ORAL_TABLET | ORAL | Status: DC | PRN
Start: 2024-02-25 — End: 2024-02-28

## 2024-02-23 MED ORDER — BUPIVACAINE HCL 0.5 % (5 MG/ML) INJECTION SOLUTION
Freq: Once | INTRAMUSCULAR | Status: DC | PRN
Start: 2024-02-23 — End: 2024-02-23
  Administered 2024-02-23: 20 mL

## 2024-02-23 MED ORDER — ASPIRIN 81 MG CHEWABLE TABLET
81.0000 mg | CHEWABLE_TABLET | Freq: Every day | ORAL | Status: DC
Start: 2024-02-23 — End: 2024-02-28
  Administered 2024-02-23 – 2024-02-28 (×6): 81 mg via ORAL
  Filled 2024-02-23 (×7): qty 1

## 2024-02-23 MED ORDER — DEXAMETHASONE SODIUM PHOSPHATE 4 MG/ML INJECTION SOLUTION
Freq: Once | INTRAMUSCULAR | Status: DC | PRN
Start: 2024-02-23 — End: 2024-02-23
  Administered 2024-02-23: 8 mg via INTRAVENOUS

## 2024-02-23 MED ORDER — INSULIN REGULAR 100 UNIT/100 ML (1 UNIT/ML) IN 0.9 % NACL IV SOLUTION
0.0000 [IU]/kg/h | INTRAVENOUS | Status: DC
Start: 2024-02-23 — End: 2024-02-24
  Administered 2024-02-23: 0.042 [IU]/kg/h via INTRAVENOUS
  Administered 2024-02-23: 0.046 [IU]/kg/h via INTRAVENOUS
  Administered 2024-02-23: 0.038 [IU]/kg/h via INTRAVENOUS
  Administered 2024-02-23: 0.029 [IU]/kg/h via INTRAVENOUS
  Administered 2024-02-23: 0.026 [IU]/kg/h via INTRAVENOUS
  Administered 2024-02-23: 0.046 [IU]/kg/h via INTRAVENOUS
  Administered 2024-02-23: 0 [IU]/kg/h via INTRAVENOUS
  Administered 2024-02-23: 0.042 [IU]/kg/h via INTRAVENOUS
  Administered 2024-02-23: 0.039 [IU]/kg/h via INTRAVENOUS
  Administered 2024-02-24: 0 [IU]/kg/h via INTRAVENOUS

## 2024-02-23 MED ORDER — CHLORHEXIDINE GLUCONATE 0.12 % MOUTHWASH
15.0000 mL | MOUTHWASH | Freq: Two times a day (BID) | Status: DC
Start: 2024-02-23 — End: 2024-02-28
  Administered 2024-02-23 – 2024-02-27 (×9): 15 mL via TOPICAL
  Administered 2024-02-27 – 2024-02-28 (×2): 0 mL via TOPICAL
  Filled 2024-02-23 (×3): qty 473

## 2024-02-23 MED ORDER — POVIDONE-IODINE 10 % TOPICAL SWAB
Freq: Once | CUTANEOUS | Status: AC
Start: 2024-02-23 — End: 2024-02-23

## 2024-02-23 MED ORDER — VANCOMYCIN 1,000 MG IV POWDER - FOR OR
Freq: Once | TOPICAL | Status: DC | PRN
Start: 2024-02-23 — End: 2024-02-23
  Administered 2024-02-23: 4 g

## 2024-02-23 MED ORDER — LISINOPRIL 20 MG TABLET
20.0000 mg | ORAL_TABLET | Freq: Every morning | ORAL | Status: DC
Start: 2024-02-23 — End: 2024-02-28

## 2024-02-23 MED ORDER — AMINOCAPROIC ACID 5 G IN NS 250ML INFUSION - FOR ANES
INTRAVENOUS | Status: DC | PRN
Start: 2024-02-23 — End: 2024-02-23
  Administered 2024-02-23: 0 g/h via INTRAVENOUS
  Administered 2024-02-23: 1 g/h via INTRAVENOUS

## 2024-02-23 MED ORDER — GLYCOPYRROLATE 0.2 MG/ML INJECTION SOLUTION
Freq: Once | INTRAMUSCULAR | Status: DC | PRN
Start: 2024-02-23 — End: 2024-02-23
  Administered 2024-02-23: .2 mg via INTRAVENOUS

## 2024-02-23 MED ORDER — DEXMEDETOMIDINE 400 MCG/100 ML (4 MCG/ML) IN DEXTROSE 5 % IV SOLUTION
INTRAVENOUS | Status: DC | PRN
Start: 2024-02-23 — End: 2024-02-23
  Administered 2024-02-23: 12 ug/kg/h via INTRAVENOUS

## 2024-02-23 MED ORDER — IPRATROPIUM BROMIDE 0.02 % SOLUTION FOR INHALATION
0.5000 mg | RESPIRATORY_TRACT | Status: DC | PRN
Start: 2024-02-23 — End: 2024-02-28

## 2024-02-23 MED ORDER — NOREPINEPHRINE BITARTRATE 4 MG/250 ML (16 MCG/ML) IN DEXTROSE 5 % IV
0.0000 ug/kg/min | INTRAVENOUS | Status: DC
Start: 2024-02-23 — End: 2024-02-25
  Administered 2024-02-23: 0.02 ug/kg/min via INTRAVENOUS
  Administered 2024-02-23: 0.04 ug/kg/min via INTRAVENOUS
  Administered 2024-02-23: 0 ug/kg/min via INTRAVENOUS
  Administered 2024-02-23: 0.04 ug/kg/min via INTRAVENOUS
  Administered 2024-02-23: 0 ug/kg/min via INTRAVENOUS
  Administered 2024-02-23: 0.08 ug/kg/min via INTRAVENOUS
  Administered 2024-02-23: 0.03 ug/kg/min via INTRAVENOUS
  Administered 2024-02-23: 0.06 ug/kg/min via INTRAVENOUS
  Administered 2024-02-23: 0.08 ug/kg/min via INTRAVENOUS
  Administered 2024-02-23: 0.06 ug/kg/min via INTRAVENOUS

## 2024-02-23 MED ORDER — CEFAZOLIN 2 GRAM/100 ML IN DEXTROSE(ISO-OSMOTIC) INTRAVENOUS PIGGYBACK
INJECTION | INTRAVENOUS | Status: AC
Start: 2024-02-23 — End: 2024-02-23
  Filled 2024-02-23: qty 100

## 2024-02-23 MED ORDER — AMINOCAPROIC ACID 250 MG/ML INTRAVENOUS SOLUTION
Freq: Once | INTRAVENOUS | Status: DC | PRN
Start: 2024-02-23 — End: 2024-02-23
  Administered 2024-02-23: 10 g via INTRAVENOUS

## 2024-02-23 MED ORDER — MAGNESIUM SULFATE 4 GRAM/100 ML (4 %) IN WATER INTRAVENOUS PIGGYBACK
INJECTION | Freq: Once | INTRAVENOUS | Status: DC | PRN
Start: 2024-02-23 — End: 2024-02-23
  Administered 2024-02-23: 4 g via INTRAVENOUS

## 2024-02-23 MED ORDER — BUPIVACAINE LIPOSOME(PF) 1.3 %(13.3 MG/ML) SUSPENSION FOR INFILTRATION
Freq: Once | Status: DC | PRN
Start: 2024-02-23 — End: 2024-02-23
  Administered 2024-02-23: 20 mL

## 2024-02-23 MED ORDER — GABAPENTIN 300 MG CAPSULE
400.0000 mg | ORAL_CAPSULE | Freq: Three times a day (TID) | ORAL | Status: DC
Start: 2024-02-23 — End: 2024-02-28
  Administered 2024-02-25 – 2024-02-28 (×8): 400 mg via ORAL
  Filled 2024-02-23 (×9): qty 1

## 2024-02-23 MED ORDER — TRAZODONE 50 MG TABLET
50.0000 mg | ORAL_TABLET | Freq: Every evening | ORAL | Status: DC
Start: 2024-02-23 — End: 2024-02-28
  Administered 2024-02-26 – 2024-02-27 (×2): 50 mg via ORAL
  Filled 2024-02-23 (×2): qty 1

## 2024-02-23 MED ORDER — OXYCODONE 5 MG TABLET
10.0000 mg | ORAL_TABLET | ORAL | Status: DC | PRN
Start: 2024-02-23 — End: 2024-02-28
  Administered 2024-02-23 – 2024-02-24 (×3): 10 mg via ORAL
  Administered 2024-02-24: 0 mg via ORAL
  Administered 2024-02-24 – 2024-02-27 (×10): 10 mg via ORAL
  Administered 2024-02-27: 0 mg via ORAL
  Administered 2024-02-27 – 2024-02-28 (×2): 10 mg via ORAL
  Filled 2024-02-23 (×15): qty 2

## 2024-02-23 MED ORDER — AMLODIPINE 10 MG TABLET
10.0000 mg | ORAL_TABLET | Freq: Every morning | ORAL | Status: DC
Start: 2024-02-23 — End: 2024-02-28

## 2024-02-23 MED ORDER — PANTOPRAZOLE 40 MG INTRAVENOUS SOLUTION
40.0000 mg | Freq: Every day | INTRAVENOUS | Status: DC
Start: 2024-02-23 — End: 2024-02-25
  Administered 2024-02-23 – 2024-02-25 (×3): 40 mg via INTRAVENOUS
  Filled 2024-02-23 (×4): qty 10

## 2024-02-23 MED ORDER — MIDAZOLAM 1 MG/ML INJECTION WRAPPER
Freq: Once | INTRAMUSCULAR | Status: DC | PRN
Start: 2024-02-23 — End: 2024-02-23
  Administered 2024-02-23: 2 mg via INTRAVENOUS

## 2024-02-23 MED ORDER — BUPIVACAINE LIPOSOME(PF) 1.3 %(13.3 MG/ML) SUSPENSION FOR INFILTRATION
Status: AC
Start: 2024-02-23 — End: 2024-02-23
  Filled 2024-02-23: qty 20

## 2024-02-23 MED ORDER — METOPROLOL TARTRATE 25 MG TABLET
6.2500 mg | ORAL_TABLET | Freq: Once | ORAL | Status: AC
Start: 2024-02-23 — End: 2024-02-23
  Administered 2024-02-23: 6.25 mg via ORAL

## 2024-02-23 MED ORDER — PHENYLEPHRINE 1 MG/10 ML (100 MCG/ML) IN 0.9 % SOD.CHLORIDE IV SYRINGE
INJECTION | Freq: Once | INTRAVENOUS | Status: DC | PRN
Start: 2024-02-23 — End: 2024-02-23
  Administered 2024-02-23: 100 ug via INTRAVENOUS

## 2024-02-23 MED ORDER — ACETAMINOPHEN 1,000 MG/100 ML (10 MG/ML) INTRAVENOUS SOLUTION
Freq: Once | INTRAVENOUS | Status: DC | PRN
Start: 2024-02-23 — End: 2024-02-23
  Administered 2024-02-23: 1000 mg via INTRAVENOUS

## 2024-02-23 MED ORDER — PROPOFOL 10 MG/ML IV BOLUS
INJECTION | Freq: Once | INTRAVENOUS | Status: DC | PRN
Start: 2024-02-23 — End: 2024-02-23
  Administered 2024-02-23: 50 mg via INTRAVENOUS
  Administered 2024-02-23: 200 mg via INTRAVENOUS

## 2024-02-23 MED ORDER — ROCURONIUM 10 MG/ML INTRAVENOUS SOLUTION
Freq: Once | INTRAVENOUS | Status: DC | PRN
Start: 2024-02-23 — End: 2024-02-23
  Administered 2024-02-23: 50 mg via INTRAVENOUS
  Administered 2024-02-23: 100 mg via INTRAVENOUS

## 2024-02-23 MED ORDER — SODIUM CHLORIDE 0.9 % INTRAVENOUS SOLUTION
0.0000 ug/kg/min | INTRAVENOUS | Status: DC
Start: 2024-02-23 — End: 2024-02-25
  Administered 2024-02-23: 0 ug/kg/min via INTRAVENOUS
  Administered 2024-02-23: 0.03 ug/kg/min via INTRAVENOUS
  Administered 2024-02-25: 0 ug/kg/min via INTRAVENOUS

## 2024-02-23 MED ORDER — EPINEPHRINE 15MG IN NS 250ML INFUSION - FOR ANES
INTRAVENOUS | Status: DC | PRN
Start: 2024-02-23 — End: 2024-02-23
  Administered 2024-02-23: .03 ug/kg/min via INTRAVENOUS
  Administered 2024-02-23: 0 ug/kg/min via INTRAVENOUS

## 2024-02-23 MED ORDER — SODIUM CHLORIDE 0.9 % (FLUSH) INJECTION SYRINGE
3.0000 mL | INJECTION | INTRAMUSCULAR | Status: DC | PRN
Start: 2024-02-23 — End: 2024-02-28

## 2024-02-23 MED ORDER — SODIUM CHLORIDE 0.9 % IV BOLUS
40.0000 mL | INJECTION | Freq: Once | Status: AC | PRN
Start: 2024-02-23 — End: 2024-02-24

## 2024-02-23 MED ORDER — OXYCODONE 5 MG TABLET
5.0000 mg | ORAL_TABLET | ORAL | Status: DC | PRN
Start: 2024-02-23 — End: 2024-02-28

## 2024-02-23 MED ORDER — CEFAZOLIN 2 GRAM/100 ML IN DEXTROSE(ISO-OSMOTIC) INTRAVENOUS PIGGYBACK
INJECTION | Freq: Once | INTRAVENOUS | Status: DC | PRN
Start: 2024-02-23 — End: 2024-02-23
  Administered 2024-02-23 (×2): 2 g via INTRAVENOUS

## 2024-02-23 MED ORDER — BISACODYL 10 MG RECTAL SUPPOSITORY
10.0000 mg | Freq: Every day | RECTAL | Status: DC | PRN
Start: 2024-02-23 — End: 2024-02-28
  Filled 2024-02-23: qty 1

## 2024-02-23 MED ORDER — MAGNESIUM HYDROXIDE 400 MG/5 ML ORAL SUSPENSION
30.0000 mL | Freq: Every day | ORAL | Status: DC | PRN
Start: 2024-02-23 — End: 2024-02-28
  Filled 2024-02-23: qty 30

## 2024-02-23 MED ORDER — CLONIDINE HCL 0.1 MG TABLET
0.1000 mg | ORAL_TABLET | Freq: Two times a day (BID) | ORAL | Status: DC
Start: 2024-02-23 — End: 2024-02-28

## 2024-02-23 MED ORDER — FENTANYL (PF) 50 MCG/ML INJECTION SOLUTION
INTRAMUSCULAR | Status: AC
Start: 2024-02-23 — End: 2024-02-23
  Filled 2024-02-23: qty 5

## 2024-02-23 MED ORDER — NITROGLYCERIN 50 MCG/ML IV DILUTION - FOR ANES
INJECTION | Freq: Once | INTRAVENOUS | Status: DC | PRN
Start: 2024-02-23 — End: 2024-02-23
  Administered 2024-02-23: 50 ug via INTRAVENOUS
  Administered 2024-02-23 (×2): 100 ug via INTRAVENOUS

## 2024-02-23 MED ORDER — DEXMEDETOMIDINE 100 MCG/ML INTRAVENOUS SOLUTION
Freq: Once | INTRAVENOUS | Status: DC | PRN
Start: 2024-02-23 — End: 2024-02-23
  Administered 2024-02-23: 40 ug via INTRAVENOUS

## 2024-02-23 MED ORDER — METOPROLOL TARTRATE 25 MG TABLET
6.2500 mg | ORAL_TABLET | Freq: Two times a day (BID) | ORAL | Status: DC
Start: 2024-02-23 — End: 2024-02-25
  Administered 2024-02-23: 0 mg via ORAL
  Administered 2024-02-24 – 2024-02-25 (×3): 6.25 mg via ORAL
  Filled 2024-02-23 (×4): qty 1

## 2024-02-23 MED ORDER — ALBUMIN, HUMAN 5 % INTRAVENOUS SOLUTION
12.5000 g | Freq: Once | INTRAVENOUS | Status: AC
Start: 2024-02-23 — End: 2024-02-23
  Administered 2024-02-23: 12.5 g via INTRAVENOUS
  Administered 2024-02-23: 0 g via INTRAVENOUS
  Filled 2024-02-23: qty 250

## 2024-02-23 MED ORDER — ACETAMINOPHEN 325 MG TABLET
975.0000 mg | ORAL_TABLET | Freq: Four times a day (QID) | ORAL | Status: AC
Start: 2024-02-23 — End: 2024-02-25
  Administered 2024-02-23 – 2024-02-25 (×7): 975 mg via ORAL
  Filled 2024-02-23 (×7): qty 3

## 2024-02-23 MED ORDER — NITROGLYCERIN 50 MG/250 ML (200 MCG/ML) IN 5 % DEXTROSE INTRAVENOUS
INTRAVENOUS | Status: DC | PRN
Start: 2024-02-23 — End: 2024-02-23
  Administered 2024-02-23: 20 ug/kg/min via INTRAVENOUS
  Administered 2024-02-23: 0 ug/kg/min via INTRAVENOUS
  Administered 2024-02-23: 30 ug/kg/min via INTRAVENOUS
  Administered 2024-02-23: 0 ug/kg/min via INTRAVENOUS

## 2024-02-23 MED ORDER — HYDROMORPHONE 0.5 MG/0.5 ML INJECTION SYRINGE
0.2000 mg | INJECTION | INTRAMUSCULAR | Status: DC | PRN
Start: 2024-02-23 — End: 2024-02-26
  Administered 2024-02-24 – 2024-02-25 (×3): 0.2 mg via INTRAVENOUS
  Filled 2024-02-23 (×3): qty 0.5

## 2024-02-23 MED ORDER — ACETAMINOPHEN 1,000 MG/100 ML (10 MG/ML) INTRAVENOUS SOLUTION
1000.0000 mg | Freq: Once | INTRAVENOUS | Status: AC
Start: 2024-02-23 — End: 2024-02-23
  Administered 2024-02-23: 1000 mg via INTRAVENOUS
  Administered 2024-02-23: 0 mg via INTRAVENOUS
  Filled 2024-02-23: qty 100

## 2024-02-23 MED ORDER — CLOPIDOGREL 75 MG TABLET
75.0000 mg | ORAL_TABLET | Freq: Every day | ORAL | Status: DC
Start: 2024-02-24 — End: 2024-02-28
  Administered 2024-02-24 – 2024-02-28 (×5): 75 mg via ORAL
  Filled 2024-02-23 (×5): qty 1

## 2024-02-23 MED ORDER — METOPROLOL TARTRATE 25 MG TABLET
ORAL_TABLET | ORAL | Status: AC
Start: 2024-02-23 — End: 2024-02-23
  Filled 2024-02-23: qty 1

## 2024-02-23 MED ORDER — ALBUMIN, HUMAN 5 % INTRAVENOUS SOLUTION
25.0000 g | Freq: Once | INTRAVENOUS | Status: AC
Start: 2024-02-23 — End: 2024-02-23
  Administered 2024-02-23: 25 g via INTRAVENOUS
  Administered 2024-02-23: 0 g via INTRAVENOUS
  Filled 2024-02-23: qty 500

## 2024-02-23 MED ORDER — ATORVASTATIN 40 MG TABLET
40.0000 mg | ORAL_TABLET | Freq: Every evening | ORAL | Status: DC
Start: 2024-02-23 — End: 2024-02-28
  Administered 2024-02-23 – 2024-02-27 (×5): 40 mg via ORAL
  Filled 2024-02-23 (×6): qty 1

## 2024-02-23 MED ORDER — VANCOMYCIN 1,000 MG INTRAVENOUS INJECTION
INTRAVENOUS | Status: AC
Start: 2024-02-23 — End: 2024-02-23
  Filled 2024-02-23: qty 40

## 2024-02-23 MED ORDER — NITROGLYCERIN 50 MG/250 ML (200 MCG/ML) IN 5 % DEXTROSE INTRAVENOUS
0.0000 ug/min | INTRAVENOUS | Status: DC
Start: 2024-02-23 — End: 2024-02-25
  Administered 2024-02-23: 0 ug/min via INTRAVENOUS

## 2024-02-23 MED ORDER — HYDROMORPHONE (PF) 2 MG/ML INJECTION SOLUTION
INTRAMUSCULAR | Status: AC
Start: 2024-02-23 — End: 2024-02-23
  Filled 2024-02-23: qty 1

## 2024-02-23 MED ORDER — ONDANSETRON HCL (PF) 4 MG/2 ML INJECTION SOLUTION
4.0000 mg | Freq: Three times a day (TID) | INTRAMUSCULAR | Status: DC | PRN
Start: 2024-02-23 — End: 2024-02-28

## 2024-02-23 MED ORDER — PROTAMINE 10 MG/ML INTRAVENOUS SOLUTION
Freq: Once | INTRAVENOUS | Status: DC | PRN
Start: 2024-02-23 — End: 2024-02-23
  Administered 2024-02-23: 270 mg via INTRAVENOUS

## 2024-02-23 MED ORDER — VASOPRESSIN 20 UNIT/ML INTRAVENOUS SOLUTION
0.0100 [IU]/min | INTRAVENOUS | Status: DC
Start: 2024-02-23 — End: 2024-02-25
  Administered 2024-02-23: 0 [IU]/min via INTRAVENOUS

## 2024-02-23 MED ORDER — CEFAZOLIN 2 GRAM/100 ML IN DEXTROSE(ISO-OSMOTIC) INTRAVENOUS PIGGYBACK
2.0000 g | INJECTION | Freq: Three times a day (TID) | INTRAVENOUS | Status: AC
Start: 2024-02-23 — End: 2024-02-25
  Administered 2024-02-23: 0 g via INTRAVENOUS
  Administered 2024-02-23 – 2024-02-24 (×3): 2 g via INTRAVENOUS
  Administered 2024-02-24 (×3): 0 g via INTRAVENOUS
  Administered 2024-02-24 – 2024-02-25 (×2): 2 g via INTRAVENOUS
  Administered 2024-02-25: 0 g via INTRAVENOUS
  Filled 2024-02-23 (×5): qty 100

## 2024-02-23 MED ORDER — METFORMIN 500 MG TABLET
500.0000 mg | ORAL_TABLET | Freq: Two times a day (BID) | ORAL | Status: DC
Start: 2024-02-23 — End: 2024-02-28
  Administered 2024-02-26 – 2024-02-28 (×4): 500 mg via ORAL
  Filled 2024-02-23 (×5): qty 1

## 2024-02-23 MED ORDER — LIDOCAINE (PF) 100 MG/5 ML (2 %) INTRAVENOUS SYRINGE
INJECTION | Freq: Once | INTRAVENOUS | Status: DC | PRN
Start: 2024-02-23 — End: 2024-02-23
  Administered 2024-02-23: 100 mg via INTRAVENOUS

## 2024-02-23 MED ORDER — NOREPINEPHRINE 16MG IN NS 250ML INFUSION - FOR ANES
INTRAVENOUS | Status: DC | PRN
Start: 2024-02-23 — End: 2024-02-23
  Administered 2024-02-23 (×2): .03 ug/kg/min via INTRAVENOUS
  Administered 2024-02-23 (×2): 0 ug/kg/min via INTRAVENOUS
  Administered 2024-02-23: .03 ug/kg/min via INTRAVENOUS
  Administered 2024-02-23: 0 ug/kg/min via INTRAVENOUS
  Administered 2024-02-23: .03 ug/kg/min via INTRAVENOUS

## 2024-02-23 MED ORDER — HEPARIN (PORCINE) 1,000 UNIT/ML INJECTION SOLUTION
Freq: Once | INTRAMUSCULAR | Status: DC | PRN
Start: 2024-02-23 — End: 2024-02-23
  Administered 2024-02-23: 30000 [IU] via INTRAVENOUS

## 2024-02-23 MED ORDER — ONDANSETRON HCL (PF) 4 MG/2 ML INJECTION SOLUTION
Freq: Once | INTRAMUSCULAR | Status: DC | PRN
Start: 2024-02-23 — End: 2024-02-23
  Administered 2024-02-23: 4 mg via INTRAVENOUS

## 2024-02-23 MED ORDER — BUPROPION HCL 75 MG TABLET
75.0000 mg | ORAL_TABLET | Freq: Two times a day (BID) | ORAL | Status: DC
Start: 2024-02-23 — End: 2024-02-28
  Administered 2024-02-23: 0 mg via ORAL
  Administered 2024-02-23 – 2024-02-28 (×10): 75 mg via ORAL
  Filled 2024-02-23 (×14): qty 1

## 2024-02-23 MED ORDER — PAPAVERINE 30 MG/ML INJECTION SOLUTION
Freq: Once | INTRAMUSCULAR | Status: DC | PRN
Start: 2024-02-23 — End: 2024-02-23
  Administered 2024-02-23: 60 mg

## 2024-02-23 MED ORDER — ASCORBIC ACID (VITAMIN C) 500 MG TABLET
500.0000 mg | ORAL_TABLET | Freq: Two times a day (BID) | ORAL | Status: AC
Start: 2024-02-24 — End: 2024-02-27
  Administered 2024-02-24 – 2024-02-27 (×8): 500 mg via ORAL
  Filled 2024-02-23 (×8): qty 1

## 2024-02-23 MED ORDER — BUPIVACAINE (PF) 0.5 % (5 MG/ML) INJECTION SOLUTION
INTRAMUSCULAR | Status: AC
Start: 2024-02-23 — End: 2024-02-23
  Filled 2024-02-23: qty 30

## 2024-02-23 MED ORDER — CALCIUM CHLORIDE 100 MG/ML (10 %) INTRAVENOUS SYRINGE
INJECTION | Freq: Once | INTRAVENOUS | Status: DC | PRN
Start: 2024-02-23 — End: 2024-02-23
  Administered 2024-02-23 (×2): 500 mg via INTRAVENOUS

## 2024-02-23 MED ORDER — FENTANYL (PF) 50 MCG/ML INJECTION WRAPPER
INJECTION | Freq: Once | INTRAMUSCULAR | Status: DC | PRN
Start: 2024-02-23 — End: 2024-02-23
  Administered 2024-02-23: 100 ug via INTRAVENOUS
  Administered 2024-02-23: 50 ug via INTRAVENOUS
  Administered 2024-02-23: 100 ug via INTRAVENOUS

## 2024-02-23 MED ORDER — CEFAZOLIN 1 GRAM SOLUTION FOR INJECTION
INTRAMUSCULAR | Status: AC
Start: 2024-02-23 — End: 2024-02-23
  Filled 2024-02-23: qty 20

## 2024-02-23 SURGICAL SUPPLY — 64 items
APPLIER PREM SRGCLP II SUP INTLK 9.75IN ATO INTERNAL CLIP VAS LF  DISP ENDOS RADGR MRK 20 MED TI (WOUND CARE SUPPLY) ×2 IMPLANT
APPLIER PREM SRGCLP III 9IN 20 CLIP TI SM INTERNAL CLIP DISP (WOUND CARE SUPPLY) ×2 IMPLANT
BAG TRANSFER 600ML (SPECIMEN COLLECTION SUPPLIES) ×4 IMPLANT
BLADE 15D 3MM MICRO STR SHARPOINT KNIFE RESTRICT DEPTH SURG (SURGICAL CUTTING SUPPLIES) ×2 IMPLANT
BOWL SOLUTION KENDALL CURITY PLASTIC 32 OZ STRL LF DISP (MED SURG SUPPLIES) ×2 IMPLANT
CANNULA 14FR 18MM 12.5IN POLYUR SLF INFL TXTR BAL PSHP STY HNDL RETRO COR SIN STRL PERF DISP (PERFUSION/HEART SUPPLIES) ×2 IMPLANT
CANNULA 14GA DLP 5.5IN ANTGRD INTROD VENT LINE STD TIP ADULT AOR ROOT PERF (PERFUSION/HEART SUPPLIES) ×2 IMPLANT
CANNULA 21FR EZ GLD 14IN AUTO DIL TIP SUT RING VENT CONN 3/8IN STR AORTA PERF (PERFUSION/HEART SUPPLIES) ×2 IMPLANT
CANNULA DLP BLUNT TIP FEMALE LL VESSEL PERF CLR (PERFUSION/HEART SUPPLIES) ×2 IMPLANT
CANNULA VENOUS 9.6/12.3 X 37_TF293702 CS/10 (PERFUSION/HEART SUPPLIES) ×2 IMPLANT
CATH PERF DLP 20FR 16IN .25IN MLBL INTROD NONVENT SLIP CONN LFT VNTRC SIL ADULT (PERFUSION/HEART SUPPLIES) ×2 IMPLANT
CLIP LRG CHEVRON HRT INTERNAL HORIZON TI LGT TRIANGULATE XSECT WRE STRL DISP LF  ORNG (WOUND CARE SUPPLY) ×2 IMPLANT
CLIP SM CHEVRON INTERNAL WECK HORIZON TI 6 CART LGT TRIANGULATE XSECT (SUTURE/WOUND CLOSURE) ×8 IMPLANT
CONN 2:1 SIL CARDIAC DRAIN 4 CHNL RADOPQ SLD COR CNTR BLAK 3/8.5MMX3/16IN STRL LF  DISP WHT (MED SURG SUPPLIES) ×2 IMPLANT
CONN TUBING 3/16-9/16IN ARGYLE POLYPROP 5IN 1 BARB STRL LF (MED SURG SUPPLIES) ×2 IMPLANT
COVER PROBE CIV-CLR 48IBX5.5IN 3 DIMENSION TELESCOPICAL FOLD STRL LF (MED SURG SUPPLIES) ×2 IMPLANT
CUSTOM CARDIAC CABG ~~LOC~~ - RUBY MEMORIAL HOSPITAL (CUSTOM TRAYS & PACK) ×2 IMPLANT
CUVETTE MONITOR .5X.5IN CDI EXT TUBE 6IN 100 101 500 SYS STRL DISP (PERFUSION/HEART SUPPLIES) ×2 IMPLANT
DISCONTINUED NO REPLACEMENT - BLADE MICRO SHARPOINT STR CLLT SURG STRL DISP (SURGICAL CUTTING SUPPLIES) ×2 IMPLANT
DRAIN INCS 24FR BLAK SIL 4 CHNL RADOPQ HBLS SLD COR CNTR STRL RND DISP WHT (MED SURG SUPPLIES) ×4 IMPLANT
DRIVER SYSTEM ZDRIVE TOOL POWER SURG ROT NON-RECHARGEABLE BATTERY-POWERED STRL DISP HI (SURGICAL CUTTING SUPPLIES) ×2 IMPLANT
FILTER TRNSF BLOOD MICAGGR LOW RSDL VOL LF  PALL SQ40 40UM PLSTR DISP (IV TUBING & ACCESSORIES) ×2 IMPLANT
GLOVE SURG 7.5 LTX PF BEAD CUF TXTR SURF N-PYRG STRL STRW BGL SRG CURVE FINGER (GLOVES AND ACCESSORIES) ×4 IMPLANT
GOWN SURGICAL XL LEV 3 NONREINFORCE SET IN SLEEVE STRL LF DISP BLUE (MED SURG SUPPLIES) ×2 IMPLANT
INSERT DDSH RETRACTR (SUTURE/WOUND CLOSURE) ×2 IMPLANT
INSERT STLTH 90MM LATIS 5 CLAMP STRL LF DISP (MED SURG SUPPLIES) ×2 IMPLANT
LEAD PACING 53CM MYOCARDIUM TMPRY BIPOLAR COAX STREAMLINE STRL LF (LEAD) ×2 IMPLANT
MARKER RADIOLOGY SKIN ANASTOMARK GRAFT W/O HOLDER SS MRI-SAFE F/CORONARY BYPASS DISTAL (IMPLANTS CARDIOTHORACIC) ×6 IMPLANT
MASK ANES AMBU KINGMASK SWTDRM 3 AIR FIL CUSH INFLAT VALVE ADJ DEHP-FR LF  DISP STRWBR (RESPIRATORY/AIRWAY MGMT) ×2 IMPLANT
OXGNTR 2.5SQ MR .5-7 LPM XCOAT_ING HRDSHL RESERVOIR PERF (PERFUSION/HEART SUPPLIES) ×2 IMPLANT
PACK CUSTOM TUBING W/PUMP (PERFUSION/HEART SUPPLIES) ×2 IMPLANT
PLATE STERNALOCK 360 STRNL CLSR BONE (IMPLANTS TRAUMA) ×2 IMPLANT
PROBE SURG 8CM 1MM 1.5MM VAS BULB STRL LF (MED SURG SUPPLIES) ×2 IMPLANT
RESERVOIR 40UM COLLECT FILTER_AUTOTRANS (MED SURG SUPPLIES) ×2 IMPLANT
SCREW BONE STERNALOCK BLU 2.4MM 16MM SLF DRILL LOCK STRNL CANC GLD PRIM CLSR SYS (IMPLANTS TRAUMA) ×28 IMPLANT
SCREW BONE STERNALOCK BLU 2.4MM 18MM SLF DRILL LOCK STRNL CANC GLD PRIM CLSR SYS (IMPLANTS TRAUMA) ×4 IMPLANT
SENSOR SHUNT CDI HEP 1.2ML SYS 500 STRL DISP (PERFUSION/HEART SUPPLIES) ×2 IMPLANT
SET 15IN DLP MLT 4 LEG ADPR SLIP CONN CARDIOPLGA .25IN (PERFUSION/HEART SUPPLIES) ×2 IMPLANT
SET AT3 AUTOTRANS TUBING (MED SURG SUPPLIES) ×2 IMPLANT
SET AUTOTRANSFUSION TUBING ATS_SUCTION LINE (MED SURG SUPPLIES) ×2 IMPLANT
SET CARDIOPLEGIA MPS 3 ND 305CM DELIVERY AREST AGT CART EXT LINE HEAT EXCHG (PERFUSION/HEART SUPPLIES) ×2 IMPLANT
SMOKE PENCIL WITH EDGE ELECTRODE 15 FT (MED SURG SUPPLIES) ×2 IMPLANT
SOL ANFG CRDNL HLTH FOAM PAD RADOPQ ADH BCK NTOX STRL LF .212OZ (ENDOSCOPIC SUPPLIES) ×2 IMPLANT
SOL IRRG 0.9% NACL 3L PLASTIC CONTAINR UROMATIC LF (MEDICATIONS/SOLUTIONS) ×2 IMPLANT
SOL IV VIAFLEX PLAS-LYTE A PH 7.4 TY 1 1000ML LF (MEDICATIONS/SOLUTIONS) ×4 IMPLANT
SPONGE GAUZE 4X4IN EXCLN AMD PHMB 6 PLY ANTIMIC NWVN HYPOALL LF  STRL DISP (WOUND CARE SUPPLY) ×4 IMPLANT
SPONGE LAP 18X18IN PREWASH POCKIT PCH STRL WHT (MED SURG SUPPLIES) ×2 IMPLANT
STOPCOCK MARQUIS K12-11412 (VASCULAR) ×2 IMPLANT
SUTURE 2-0 CT1 TAPERPOINT VICRYL+ 27IN UNDYED BRD ANBCTRL COAT ABS (SUTURE/WOUND CLOSURE) ×4 IMPLANT
SUTURE 2-0 SH-1 ETHIBOND EXC 36IN GRN 2 ARM BRD NONAB (SUTURE/WOUND CLOSURE) ×2 IMPLANT
SUTURE 3-0 PS2 SPRL STRATAFIX MONOCRYL + 12IN UNDYED ANBCTRL UNDIR ABS (SUTURE/WOUND CLOSURE) ×4 IMPLANT
SUTURE 4-0 SH VICRYL+ 27IN VIOL BRD ANBCTRL COAT ABS (SUTURE/WOUND CLOSURE) ×2 IMPLANT
SUTURE 7-0 BV175-7 PROLENE MTPS 24IN BLU 2 ARM MONOF NONAB (SUTURE/WOUND CLOSURE) ×6 IMPLANT
SUTURE POLYPROP 4-0 RB1 .5 CIRLE PROLENE 36IN BLU 2 ARM MONOF NON ABS NONAB STRL (SUTURE/WOUND CLOSURE) ×2 IMPLANT
SUTURE POLYPROP PROLENE BLU MONOF NONAB STRL LF (SUTURE/WOUND CLOSURE) ×8 IMPLANT
SUTURE SILK 0 CT2 PERMAHAND 18IN BLK CR BRD 8 STRN NONAB (SUTURE/WOUND CLOSURE) ×2 IMPLANT
SUTURE SS 7 CCS 18IN MONOF 4 STRN NONAB (SUTURE/WOUND CLOSURE) ×4 IMPLANT
SUTURE TRCLSN PDS 1 CTX STRATAFIX PDS + 24IN VIOL UNDIR SMTR ABS NONST (SUTURE/WOUND CLOSURE) ×2 IMPLANT
TOWEL 26X16IN COTTON BLU SAF DISP SURG STRL LF (DRAPE/PACKS/SHEETS/OR TOWEL) ×8 IMPLANT
TRAY CATH 16FR 1 LYR FOLEY DRAIN BAG TEMP SENSOR PERI WIPE SIL PVP 400ML 2.5L 10CC LF (UROLOGICAL SUPPLIES) ×2 IMPLANT
TUBING PRESS MONITOR 48IN TRUWAVE MALE TO FEMALE CONN TRANSDUC STRL DISP (MED SURG SUPPLIES) ×4 IMPLANT
TUBING SUCT CLR 10FT .25IN MEDIVAC MXGR NCDTV MALE TO MALE CONN STRL LF  DISP (MED SURG SUPPLIES) ×8 IMPLANT
WIRE PACING MYO WIRE ULTRA THIN 60CM TEMP ZIGZAG INLINE BIPOLAR STRL LF DISP ×4 IMPLANT
WIRE PACING WHT 60CM STR LEAD BIPOLAR ZGZG LOW PROF MYOCARDIAL NEEDLE STRL DISP (PERFUSION/HEART SUPPLIES) ×4 IMPLANT

## 2024-02-23 NOTE — Anesthesia Procedure Notes (Signed)
 [facility]    Arterial Line Procedure    Pt location: In OR  Consent:     Consent given by:  Patient    Risks discussed:  Bleeding, infection and pain    Alternatives discussed:  No treatment  Universal protocol:     Procedure explained and questions answered to patient or proxy's satisfaction: yes      Patient identity confirmed:  Verbally with patient  Pre-procedure details:     Preparation: Preprocedure hand washing was performed; sterile field was maintained         Skin Prep used: Chlorhexidine  gluconate  Anesthesia (see MAR for exact dosages):     Anesthesia method:  local infiltration    Local anesthetic:  lidocaine  1% w/o epi    A 20 G Catheter type: Arrow 1 and 3/4 inch in length,  Placed on the left  radial artery  using anatomical landmarks, palpation and Seldinger With  number of attempts:1.Secured with: transparent dressing   MEDICATIONS:     Post-procedure details:    Patient tolerance of procedure:  Tolerated well, no immediate complications Waveform appropriate, Correlates with cuff and Flush/aspirate well  Complications:none  Performed By:  Performing provider: Atanacio Rush, MD Authorizing provider: Atanacio Rush, MD

## 2024-02-23 NOTE — Nurses Notes (Signed)
 1513 RT set pt on CPAP trial, blood gas obtained, orders to extubate per Warren Mirza    1700 - pt up to chair, tolerated well

## 2024-02-23 NOTE — Anesthesia Procedure Notes (Signed)
 [facility] Placement    Performed By  Performing provider: Atanacio Rush, MD  Authorizing provider: Atanacio Rush, MD    Patient Location  Pt location: In OR  Consent  verbal and written from patient.  Protocol   Patient identity confirmed with anonymous protocol, patient vented/unresponsive.  Preprocedure hand washing was performed; sterile field was maintained   Time out performed: was performed to confirm correct patient and procedure.  Ultrasound  Ultrasound was used to identify the vessel. It was assessed and patent. Ultrasound was used to visualize vascular needle entry into the vessel. Ultrasound used for needle placement, imaging, supervision, and interpretation  Image:  Images available in PACS    Procedure   A 9 Fr Cordis and Double lumen central line was inserted into the right Internal Jugular, for  central pressure monitoring, multiple IV medications or infusions and vascular access. Patient was placed in  Trendelenburg .  Technique Anatomical landmarks, Guidewire, Seldinger and Ultrasound guided used guidewire used and guidewire removed    Number of attempts :1.Site was prepped with chlorhexidine , patient was anesthetized, and site local  was used. Line secured by Transparent dressing and line sutured at  cms. Total catheter length of               Post Placement  blood return through all ports, free fluid flow, good waveform, No pulsation, Dark color and Lumen(s) flush without resistance  Complications(if any).  none  Comments

## 2024-02-23 NOTE — H&P (Signed)
 [facility]  H&P Update Form  Date: 02/23/24    Jeremy Sexton, Jeremy Sexton, 68 y.o. male  MRN: [id number]  Inpatient Admission Date: 02/23/2024  Date of Birth:  1955-10-17      H & P updated the day of the procedure.  Previous H&P completed on this date: 02/22/24  Previous H&P completed by: Arla Pulling   H&P completed within 30 days of surgical procedure and has been reviewed within 24 hours of admission but prior to surgery or a procedure requiring anesthesia services, the patient has been examined, and no change has occured in the patients condition since the H&P was completed.   Change in medications: No    Patient continues to be appropriate candidate for planned surgical procedure. YES    Leonor Coombes, MD  02/23/2024, 06:36

## 2024-02-23 NOTE — Anesthesia OR-ICU Handoff (Signed)
 Anesthesia ICU Transfer of Care  Jeremy Sexton is a 68 y.o. ,male, Weight: 90.7 kg (200 lb)   had Procedure(s) with comments:  BYPASS GRAFT CORONARY ARTERY/ CABG/ EVH - MAIN OR  ECHOCARDIOGRAM TRANSESOPHAGEAL  performed  02/23/24   Primary Service: Leonor Coombes, MD    Past Medical History:   Diagnosis Date    Back problem     ruptured discs/lumbar    BMI 27.0-27.9,adult     Chronic pain     back pain    Coronary artery disease     Diabetes mellitus, type 2     Esophageal reflux     H/O hearing loss     minor; no hearing aids    High cholesterol     HTN (hypertension)     Hyperlipidemia     Numbness     elbow to fingers on left arm    Peripheral neuropathy     PVD (peripheral vascular disease) (CMS HCC)     Spinal stenosis of cervicothoracic region     Type 2 diabetes mellitus     Wears glasses     Reading glasses    Wound, open     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 The is treated by Dr. Steen at the wound center and changes dressing every 2 days.      Allergy History as of 02/23/24       TETRACYCLINE         Noted Status Severity Type Reaction    06/11/23 1046 Silva Stabs, RN 01/14/17 Deleted       11/17/22 1148 Comer, [place], RN 01/14/17 Active                 TETRACYCLINES         Noted Status Severity Type Reaction    06/11/23 1046 Silva Stabs, RN 01/20/23 Active Medium  Rash, Itching    01/20/23 0809 Jearline Ridge, RN 01/20/23 Active Medium  Rash              TREE NUTS         Noted Status Severity Type Reaction    06/11/23 1047 Silva Stabs, RN 06/11/23 Active Medium  Rash, Itching              BEE VENOM PROTEIN (HONEY BEE)         Noted Status Severity Type Reaction    11/30/23 0853 Salina Remak, RN 11/30/23 Active                     I completed my ICU transfer of care/ Handoff to the ICU receiving personnel during which we discussed :  Access, Airway, All key and critical aspects of case discussed, Analgesia, Antibiotics, Expectation of post  procedure, Fluids/Product, Gave opportunity for questions and acknowledgement of understanding, Labs and PMHx    anesthesia risks / alternatives discussed This is for handoff only . For doses and times see MAR                                                     Last OR Temp: Temperature: 36.4 C (97.5 F)  ABG:  PH (ARTERIAL)   Date Value Ref Range Status   02/23/2024 7.37 7.35 - 7.45 Final     PH (  T)   Date Value Ref Range Status   02/23/2024 7.37 7.35 - 7.45 Final     PCO2 (ARTERIAL)   Date Value Ref Range Status   02/23/2024 44 35 - 45 mm/Hg Final     PCO2 (VENOUS)   Date Value Ref Range Status   02/23/2024 52 (H) 41 - 51 mm/Hg Final     PO2 (ARTERIAL)   Date Value Ref Range Status   02/23/2024 155 (H) 83 - 108 mm/Hg Final     PO2 (VENOUS)   Date Value Ref Range Status   02/23/2024 50 35 - 50 mm/Hg Final     Comment:     Manufacturer does not recommend venous sample for assessment of patient oxygenation status. A reference range is provided but abnormal results will not flag in Epic.     SODIUM   Date Value Ref Range Status   02/23/2024 136 136 - 145 mmol/L Final     POTASSIUM   Date Value Ref Range Status   02/22/2024 4.2 3.2 - 5.0 mmol/L Final     KETONES   Date Value Ref Range Status   02/22/2024 Trace (A) Negative mg/dL Final     KAPPA FREE LIGHT CHAINS   Date Value Ref Range Status   01/15/2023 1.82 1.25 - 3.25 mg/dL Final   93/79/7975 8.17 1.25 - 3.25 mg/dL Final     KAPPA/LAMBDA FLC RATIO   Date Value Ref Range Status   01/15/2023 1.17 0.80 - 2.10 Final   01/15/2023 1.17 0.80 - 2.10 Final     WHOLE BLOOD POTASSIUM   Date Value Ref Range Status   02/23/2024 4.3 3.5 - 5.1 mmol/L Final     CHLORIDE   Date Value Ref Range Status   02/23/2024 110 (H) 98 - 107 mmol/L Final     CALCIUM    Date Value Ref Range Status   02/22/2024 10.1 8.3 - 10.7 mg/dL Final     Calculated P Axis   Date Value Ref Range Status   02/22/2024 46 degrees Final     Calculated R Axis   Date Value Ref Range Status   02/22/2024 19 degrees  Final     Calculated T Axis   Date Value Ref Range Status   02/22/2024 40 degrees Final     Cath EF Quantitative   Date Value Ref Range Status   11/30/2023 50 % Final     IONIZED CALCIUM    Date Value Ref Range Status   02/23/2024 1.38 (H) 1.15 - 1.33 mmol/L Final     LACTATE   Date Value Ref Range Status   02/23/2024 1.9 <=1.9 mmol/L Final     HEMOGLOBIN   Date Value Ref Range Status   02/23/2024 9.4 (L) 12.0 - 18.0 g/dL Final     OXYHEMOGLOBIN   Date Value Ref Range Status   02/23/2024 97.0 (H) 90.0 - 95.0 % Final     CARBOXYHEMOGLOBIN   Date Value Ref Range Status   02/23/2024 1.9 <=3.0 % Final     MET-HEMOGLOBIN   Date Value Ref Range Status   02/23/2024 0.7 <=1.5 % Final     BASE EXCESS (ARTERIAL)   Date Value Ref Range Status   02/23/2024 0.0 mmol/L Final     BASE EXCESS    Date Value Ref Range Status   02/23/2024 3.5 mmol/L Final     BICARBONATE (ARTERIAL)   Date Value Ref Range Status   02/23/2024 24.9 21.0 - 28.0 mmol/L Final  BICARBONATE (VENOUS)   Date Value Ref Range Status   02/23/2024 27.5 22.0 - 29.0 mmol/L Final     %FIO2 (VENOUS)   Date Value Ref Range Status   02/23/2024 85.0 % Final     Airway:  EndoTracheal Tube Oral 8.0 22 Lip (Active)   Airway Secure Tape 02/23/24 0003

## 2024-02-23 NOTE — Care Plan (Signed)
 Problem: Wound  Goal: Optimal Coping  Outcome: Ongoing (see interventions/notes)  Goal: Optimal Functional Ability  Outcome: Ongoing (see interventions/notes)  Intervention: Optimize Functional Ability  Recent Flowsheet Documentation  Taken 02/23/2024 1300 by Leanna DASEN, GN  Activity Management: bedrest  Assistive Device Utilized: team lift  Goal: Absence of Infection Signs and Symptoms  Outcome: Ongoing (see interventions/notes)  Intervention: Prevent or Manage Infection  Recent Flowsheet Documentation  Taken 02/23/2024 1300 by Leanna T, GN  Fever Reduction/Comfort Measures:   lightweight bedding   lightweight clothing  Infection Management: aseptic technique maintained  Goal: Improved Oral Intake  Outcome: Ongoing (see interventions/notes)  Goal: Optimal Pain Control and Function  Outcome: Ongoing (see interventions/notes)  Intervention: Prevent or Manage Pain  Recent Flowsheet Documentation  Taken 02/23/2024 1300 by Leanna DASEN, GN  Sleep/Rest Enhancement:   awakenings minimized   noise level reduced   regular sleep/rest pattern promoted   relaxation techniques promoted  Goal: Skin Health and Integrity  Outcome: Ongoing (see interventions/notes)  Intervention: Optimize Skin Protection  Recent Flowsheet Documentation  Taken 02/23/2024 1300 by Leanna T, GN  Pressure Reduction Techniques:   Pressure points protected   Supplemented with small shifts  Pressure Reduction Devices: Specialty bed/mattress utilized  Activity Management: bedrest  Head of Bed (HOB) Positioning: HOB at 20-30 degrees  Goal: Optimal Wound Healing  Outcome: Ongoing (see interventions/notes)  Intervention: Promote Wound Healing  Recent Flowsheet Documentation  Taken 02/23/2024 1300 by Leanna T, GN  Pressure Reduction Techniques:   Pressure points protected   Supplemented with small shifts  Pressure Reduction Devices: Specialty bed/mattress utilized  Sleep/Rest Enhancement:   awakenings minimized   noise level reduced   regular sleep/rest pattern promoted    relaxation techniques promoted  Activity Management: bedrest     Problem: Mechanical Ventilation Invasive  Goal: Effective Communication  Outcome: Ongoing (see interventions/notes)  Goal: Optimal Device Function  Outcome: Ongoing (see interventions/notes)  Intervention: Optimize Device Care and Function  Recent Flowsheet Documentation  Taken 02/23/2024 1300 by Leanna DASEN, GN  Airway Safety Measures: mask valve resuscitator at bedside  Airway/Ventilation Management:   airway patency maintained   calming measures promoted   position adjusted  Goal: Mechanical Ventilation Liberation  Outcome: Ongoing (see interventions/notes)  Intervention: Promote Extubation and Mechanical Ventilation Liberation  Recent Flowsheet Documentation  Taken 02/23/2024 1300 by Leanna DASEN, GN  Sleep/Rest Enhancement:   awakenings minimized   noise level reduced   regular sleep/rest pattern promoted   relaxation techniques promoted  Goal: Optimal Nutrition Delivery  Outcome: Ongoing (see interventions/notes)  Goal: Absence of Device-Related Skin and Tissue Injury  Outcome: Ongoing (see interventions/notes)  Intervention: Maintain Skin and Tissue Health  Recent Flowsheet Documentation  Taken 02/23/2024 1300 by Leanna DASEN, GN  Device Skin Pressure Protection:   absorbent pad utilized/changed   adhesive use limited   tubing/devices free from skin contact  Goal: Absence of Ventilator-Induced Lung Injury  Outcome: Ongoing (see interventions/notes)  Intervention: Facilitate Lung-Protection Measures  Recent Flowsheet Documentation  Taken 02/23/2024 1300 by Leanna DASEN, GN  Lung Protection Measures: lung compliance monitored  Intervention: Prevent Ventilator-Associated Pneumonia  Recent Flowsheet Documentation  Taken 02/23/2024 1300 by Leanna T, GN  VAP Prevention Bundle:   HOB elevation maintained   readiness to extubate assessed   VTE prophylaxis provided  Head of Bed (HOB) Positioning: HOB at 20-30 degrees  VAP Prevention Measures: completed     Problem: Adult  Inpatient Plan of Care  Goal: Plan of Care Review  Outcome: Ongoing (see  interventions/notes)  Goal: Patient-Specific Goal (Individualized)  Outcome: Ongoing (see interventions/notes)  Goal: Absence of Hospital-Acquired Illness or Injury  Outcome: Ongoing (see interventions/notes)  Intervention: Identify and Manage Fall Risk  Recent Flowsheet Documentation  Taken 02/23/2024 1300 by Leanna DASEN, GN  Safety Promotion/Fall Prevention:   fall prevention program maintained   safety round/check completed  Intervention: Prevent Skin Injury  Recent Flowsheet Documentation  Taken 02/23/2024 1300 by Leanna DASEN, GN  Body Position: supine, head elevated  Skin Protection:   adhesive use limited   electrode sites changed   incontinence pads utilized   tubing/devices free from skin contact  Intervention: Prevent and Manage VTE (Venous Thromboembolism) Risk  Recent Flowsheet Documentation  Taken 02/23/2024 1300 by Leanna T, GN  Sequential Compression Device (SCDs):   Sequential compression device on   Sequential compression device initiated  Goal: Optimal Comfort and Wellbeing  Outcome: Ongoing (see interventions/notes)  Goal: Rounds/Family Conference  Outcome: Ongoing (see interventions/notes)

## 2024-02-23 NOTE — OR Surgeon (Signed)
 [facility]   Cardiac Surgery   Operative Report     PATIENT NAME: Jeremy Sexton [facility] NUMBER:  [id number]   DATE OF OPERATION: February 23, 2024   DATE OF BIRTH: 12/23/1955   AGE OF PATIENT AT SURGERY:  67 years   WEIGHT OF PATIENT AT SURGERY: Weight: 90.7 kg (200 lb)   BODY SURFACE AREA OF PATIENT AT SURGERY:  Estimated body surface area is 2.15 meters squared as calculated from the following:    Height as of this encounter: 1.829 m (6').    Weight as of this encounter: 90.7 kg (200 lb).     PREOPERATIVE DIAGNOSES:   1. Coronary Artery Disease (CAD)   2. Past history of myocardial infarction with total occlusion of the left anterior descending coronary artery   3. Hypertension   4. Hypercholesterolemia   5. Diabetes mellitus, Type 2   6. Peripheral vascular disease   7. Chronic right lower extremity wound   8. Peripheral neuropathy   9. Anemia       NAME OF PROCEDURES:  1. CABG x 5: Arterial graft x 1  (66466); Vein grafts x 4  (33521)   2. CABG, Endoscopic saphenous vein graft harvesting from left lower extremity  (66491)   3. Rigid sternal fixation using sternal lock blue 360 plating system       SURGEON:                                               Leonor Coombes, MD     ASSISTANTS:    1. 1st Assistant: Abby Cliff, PA   2. 2nd Assistant: Salomon Nettle, RNFA      ANESTHESIOLOGIST:           Atanacio Rush, MD    Cardiologist: Dr. Neomi  PCP: Ladora Lines, DO     ANESTHESIA:                                             General anesthesia with endotracheal intubation.    SPECIMEN:                                                None    ESTIMATED BLOOD LOSS:                    Cell Saver    INDICATIONS FOR PROCEDURE and OPERATIVE FINDINGS:   This 68 years old male presented with the above diagnoses.  Prior to surgical intervention, we had a detailed discussion regarding the indications for surgery in this setting as well as the operative strategy, alternatives for treatment, and the associated  risks and benefits of surgical therapy.  We utilized The Society of Thoracic Surgeons (STS) risk calculator to estimate risks of mortality and mortality plus morbidity at 1 % and 5 %, respectively.  The patient verbalized full understanding of these risks and the desire to proceed with surgery.      The performance of this operative procedure has met national standards set out by the [employer].  The patient received preoperative antibiotic prophylaxis in the form of Cefazolin  within 1 hour of skin incision.  I have ordered for discontinuation of these antibiotics within 48 hours postoperatively.  Beta blocker was given within 24 hours of procedure.     The left internal mammary artery (LIMA) was utilized.    I have ordered aspirin , beta blockers, and statins to be given postoperatively and upon discharge.    The patient tolerated the procedure well.      DESCRIPTION OF PROCEDURE:  The patient was prepped and draped in standard fashion after induction of general endotracheal anesthesia and percutaneous placement of central venous catheter, Foley catheter, and arterial line.  Median sternotomy was performed and the following conduits for coronary artery bypass grafting were procured:    Left internal thoracic artery was harvested from the left chest wall as a pedicled graft  Left lesser saphenous vein was harvested endoscopically     The patient was systemically heparinized.  The ascending aorta was cannulated.  The right atrium was cannulated with a two-stage venous cannula.  Cardiopulmonary bypass was established.  An aortic root vent was placed.  An aortic cross-clamp was atraumatically applied with the pump flow down.  Hyperkalemic cold blood cardioplegic solution was delivered to achieve an excellent diastolic myocardial arrest.  Cardioplegia was administered antegrade and retrograde.  Cardioplegia was delivered every 20 minutes meticulously for excellent myocardial protection  throughout the aortic cross clamp period.        A left ventricular vent was placed through the right superior pulmonary vein.    The following bypass grafts were performed using running suture technique.  Proximal anastomoses were performed with Partial Occlusion Clamp:     1. Proximal: Left internal thoracic artery in situ    Distal: Left anterior descending artery - mid (mid LAD)    Graft Flow: 35/2.2  Very small caliber diseased vessel throughout its course.  As you recall this was a completely occluded vessel.        2. Proximal: Saphenous vein graft anastomosed to aorta    Distal: Diagonal 1 (D1)    Graft Flow: 40/1.6        3. Proximal: Saphenous vein graft anastomosed to aorta    Distal: Obtuse Marginal 1 (OM1)    Graft Flow: 24/1.1    Small caliber vessel the proximally 1.4 mm.        4. Proximal: Saphenous vein graft anastomosed to aorta    Distal: Posterior descending coronary artery (PDA) and Right posterior left ventricular branch (RPLB)    Graft Flow: 88/1.3         Hotshot warm reperfusion cardioplegic solution was delivered followed by 2 minutes of warm blood.  The ascending aortic cross-clamp was atraumatically removed with the pump flow down.  The patient was rewarmed.  Atrial and ventricular pacing wires were placed.  The patient was weaned from cardiopulmonary bypass.  Protamine  was administered and heparin  was reversed.  The patient was decannulated.  Meticulous hemostasis was achieved.  Chest drains were placed.  Platelet rich plasma was delivered into the pericardial space.  The sternum was reapproximated using sternal wires.  Multilayered closure followed and dressings were placed.  All instrument, needle, and sponge counts were correct.  The patient was transferred to the intensive care unit in stable condition with all counts being correct at the end of the procedure.    The postprocedural intraoperative transesophageal echocardiogram revealed continue mildly depressed ejection fraction of  the proximally 45%.  Patient did continue to have mild aortic valve regurgitation.SABRA HALF ATTESTATION:   1. 1st Assistant: Abby Cliff, PA  assisted with opening and endoscopic vein harvest.  2. 2nd Assistant: Salomon Nettle, RNFA  assisted with closing and endoscopic vein harvest.     I was the primary surgeon for the entire procedure.  No qualified resident was available to assist.       Leonor Coombes, MD 02/23/2024 07:34  Department of Cardiovascular and Thoracic Surgery   [facility]

## 2024-02-23 NOTE — Respiratory Therapy (Signed)
 1244-Arrived from OR and placed on Vent per protocol.  1255-12 Lead EKG obtained and given to nurse Ozell.  1408-Patient placed on SPONT trial.  15/5 40%. Patient Sats 100%  1454-Ventilator Mechanics performed.  Vt 986,fvc 1383, RR 15, MV 14.8,Nif -25, RSBI 15, Sat 100%, FIO2 40%.  1513-Patient extubated per Warren Mirza to BiPAP 12/5 35%.  Patient tolerated well SPO2% 100%
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
## 2024-02-23 NOTE — Nurses Notes (Addendum)
 Patient arrived from [facility] at 70. Patient arrived A/V paced at 80; rate increased to 90 per orders in epic. Per anesthesia, atrial threshold required ventricular support as well. Patient bradycardic in the 30's under pacemaker. Dr skipper bedside and is aware. Patient intubated, reversed after arrival by anesthesia. He awoken and was immediately able to follow simple commands, shake/nod head to yes and no questions. A quick spontaneous trial was performed and aborted due to low minute volumes. A vigorous chest tube air leak was discovered upon arrival. After assessment, it was noted there was a large cut/tear in drain line. Atrium was changed with resolved air leak completely.

## 2024-02-23 NOTE — Anesthesia Postprocedure Evaluation (Signed)
 Anesthesia Post Op Evaluation    Patient: Jeremy Sexton  Procedure(s) with comments:  BYPASS GRAFT CORONARY ARTERY/ CABG/ EVH - MAIN OR  ECHOCARDIOGRAM TRANSESOPHAGEAL    Last Vitals:Temperature: 36.4 C (97.5 F) (02/23/24 0540)  Heart Rate: 62 (02/23/24 0540)  BP (Non-Invasive): 134/65 (134/65 -R,  135/64-L) (02/23/24 0540)  Respiratory Rate: 16 (02/23/24 0540)  SpO2: 100 % (02/23/24 0540)    No notable events documented.      Patient location during evaluation: ICU       Patient participation: complete - patient cannot participate  Level of consciousness: sedated    Airway patency: intubated    Anesthetic complications: no  Cardiovascular status: acceptable  Respiratory status: acceptable  Hydration status: acceptable  Patient post-procedure temperature: Pt Normothermic
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
## 2024-02-24 ENCOUNTER — Inpatient Hospital Stay (HOSPITAL_COMMUNITY): Payer: MEDICARE

## 2024-02-24 DIAGNOSIS — Z4682 Encounter for fitting and adjustment of non-vascular catheter: Secondary | ICD-10-CM

## 2024-02-24 DIAGNOSIS — L97519 Non-pressure chronic ulcer of other part of right foot with unspecified severity: Secondary | ICD-10-CM

## 2024-02-24 DIAGNOSIS — R140 Abdominal distension (gaseous): Secondary | ICD-10-CM

## 2024-02-24 LAB — POC BLOOD GLUCOSE (RESULTS)
GLUCOSE, POC: 148 mg/dL — ABNORMAL HIGH (ref 70–100)
GLUCOSE, POC: 149 mg/dL — ABNORMAL HIGH (ref 70–100)
GLUCOSE, POC: 154 mg/dL — ABNORMAL HIGH (ref 70–100)
GLUCOSE, POC: 165 mg/dL — ABNORMAL HIGH (ref 70–100)
GLUCOSE, POC: 191 mg/dL — ABNORMAL HIGH (ref 70–100)
GLUCOSE, POC: 220 mg/dL — ABNORMAL HIGH (ref 70–100)

## 2024-02-24 LAB — BASIC METABOLIC PANEL
ANION GAP: 14 mmol/L (ref 7–18)
BUN/CREA RATIO: 21
BUN: 23 mg/dL (ref 8–23)
CALCIUM: 9 mg/dL (ref 8.3–10.7)
CHLORIDE: 101 mmol/L (ref 96–106)
CO2 TOTAL: 20 mmol/L — ABNORMAL LOW (ref 22–30)
CREATININE: 1.07 mg/dL (ref 0.70–1.20)
ESTIMATED GFR: 76 mL/min/1.73mˆ2 — ABNORMAL LOW (ref >90)
GLUCOSE: 194 mg/dL — ABNORMAL HIGH (ref 74–109)
POTASSIUM: 4.8 mmol/L (ref 3.2–5.0)
SODIUM: 135 mmol/L (ref 133–144)

## 2024-02-24 LAB — CBC WITH DIFF
BASOPHIL #: <0.1 x10ˆ3/uL (ref <=0.20)
BASOPHIL %: 0.2 %
EOSINOPHIL #: <0.1 x10ˆ3/uL (ref <=0.50)
EOSINOPHIL %: 0 %
HCT: 29.4 % — ABNORMAL LOW (ref 38.9–52.0)
HGB: 9.6 g/dL — ABNORMAL LOW (ref 13.4–17.5)
IMMATURE GRANULOCYTE #: <0.1 x10ˆ3/uL (ref <0.10)
IMMATURE GRANULOCYTE %: 0.4 % (ref 0.0–1.0)
LYMPHOCYTE #: 1.02 x10ˆ3/uL (ref 1.00–4.80)
LYMPHOCYTE %: 7.9 %
MCH: 27.7 pg (ref 26.0–32.0)
MCHC: 32.7 g/dL (ref 31.0–35.5)
MCV: 85 fL (ref 78.0–100.0)
MONOCYTE #: 0.9 x10ˆ3/uL (ref 0.20–1.10)
MONOCYTE %: 6.9 %
MPV: 10.4 fL (ref 8.7–12.5)
NEUTROPHIL #: 10.97 x10ˆ3/uL — ABNORMAL HIGH (ref 1.50–7.70)
NEUTROPHIL %: 84.6 %
PLATELETS: 141 x10ˆ3/uL — ABNORMAL LOW (ref 150–400)
RBC: 3.46 x10ˆ6/uL — ABNORMAL LOW (ref 4.50–6.10)
RDW-CV: 14.5 % (ref 11.5–15.5)
WBC: 13 x10ˆ3/uL — ABNORMAL HIGH (ref 3.7–11.0)

## 2024-02-24 LAB — IONIZED CALCIUM WITH PH
IONIZED CALCIUM: 1.19 mmol/L (ref 1.15–1.33)
PH (VENOUS): 7.36 (ref 7.32–7.43)

## 2024-02-24 LAB — MAGNESIUM: MAGNESIUM: 3.1 mg/dL — ABNORMAL HIGH (ref 1.6–2.4)

## 2024-02-24 MED ORDER — GLUCAGON 1 MG SOLUTION FOR INJECTION
1.0000 mg | Freq: Once | INTRAMUSCULAR | Status: DC | PRN
Start: 2024-02-24 — End: 2024-02-28

## 2024-02-24 MED ORDER — DEXTROSE 40 % ORAL GEL
15.0000 g | ORAL | Status: DC | PRN
Start: 2024-02-24 — End: 2024-02-28

## 2024-02-24 MED ORDER — INSULIN LISPRO 100 UNIT/ML SUB-Q SSIP VIAL
2.0000 [IU] | INJECTION | SUBCUTANEOUS | Status: DC
Start: 2024-02-24 — End: 2024-02-28
  Administered 2024-02-24: 3 [IU] via SUBCUTANEOUS
  Administered 2024-02-24 (×3): 2 [IU] via SUBCUTANEOUS
  Administered 2024-02-24 (×2): 0 [IU] via SUBCUTANEOUS
  Administered 2024-02-25 – 2024-02-26 (×7): 2 [IU] via SUBCUTANEOUS
  Administered 2024-02-26 (×2): 0 [IU] via SUBCUTANEOUS
  Administered 2024-02-26 – 2024-02-27 (×4): 2 [IU] via SUBCUTANEOUS
  Administered 2024-02-27: 0 [IU] via SUBCUTANEOUS
  Administered 2024-02-27 (×3): 2 [IU] via SUBCUTANEOUS
  Administered 2024-02-28 (×2): 0 [IU] via SUBCUTANEOUS
  Administered 2024-02-28: 2 [IU] via SUBCUTANEOUS
  Filled 2024-02-24: qty 300

## 2024-02-24 MED ORDER — LACTATED RINGERS IV BOLUS
1000.0000 mL | INJECTION | Freq: Once | Status: AC
Start: 2024-02-24 — End: 2024-02-24
  Administered 2024-02-24: 0 mL via INTRAVENOUS
  Administered 2024-02-24: 1000 mL via INTRAVENOUS

## 2024-02-24 MED ORDER — DEXTROSE 50 % IN WATER (D50W) INTRAVENOUS SYRINGE
12.5000 g | INJECTION | INTRAVENOUS | Status: DC | PRN
Start: 2024-02-24 — End: 2024-02-28

## 2024-02-24 MED ORDER — KETOROLAC 30 MG/ML (1 ML) INJECTION SOLUTION
15.0000 mg | Freq: Once | INTRAMUSCULAR | Status: AC
Start: 2024-02-24 — End: 2024-02-24
  Administered 2024-02-24: 15 mg via INTRAVENOUS
  Filled 2024-02-24: qty 1

## 2024-02-24 NOTE — Nurses Notes (Signed)
 Patient has not voided, bladder scan 76 ml noted. Denies pain or urge to void. Abdomen flat, non-tender, non distended. Pulmonary notified, new orders LR bolus

## 2024-02-24 NOTE — Nurses Notes (Signed)
 Patient syncope with dizziness upon standing with hypotension, back to chair for rest period.  Standing and walking place 40 steps.  O2 sats remained in mid 90's on 2lpm via NC.  Monitor remained AV paced at 90.
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
## 2024-02-24 NOTE — Progress Notes (Signed)
  [facility]  Progress Note    [name]  Date of service: 02/24/2024   Date of Admission:  02/23/2024    Hospital Day:  LOS: 1 day   Subjective: SOB    HPI/Discussion:  Jeremy Sexton is a 68 y.o., White male who presents with a known history of coronary artery disease underwent left heart catheterization by Dr. Neomi that revealed multivessel coronary disease.  He was referred to Dr. Skipper for coronary artery bypass graft.  He underwent procedure today CABG x 5 and was taken to the open heart recovery intubated on IV vasopressors.  We have been asked to see for critical care management.  He is currently intubated I will give review of systems or HPI    7/30:  Up in chair on 2 L nasal cannula.  Did have some dizziness with standing this morning.  Hemoglobin stable but got 2 units of blood postoperatively.  Paced at 80.  Fluid status looks okay with CVP of 5.  No fevers or chills.    Vital Signs:  Temp  Avg: 36.2 C (97.1 F)  Min: 36.1 C (97 F)  Max: 36.2 C (97.2 F)    Pulse  Avg: 90.3  Min: 80  Max: 98 BP  Min: 93/65  Max: 143/87   Resp  Avg: 13.7  Min: 9  Max: 26 SpO2  Avg: 98 %  Min: 94 %  Max: 100 %          Input/Output    Intake/Output Summary (Last 24 hours) at 02/24/2024 0743  Last data filed at 02/24/2024 0700  Gross per 24 hour   Intake 3527.29 ml   Output 1740 ml   Net 1787.29 ml    I/O last shift:  07/30 0700 - 07/30 1859  In: -   Out: 90 [Urine:40; Chest Tube:50]       Physical Exam:  GENERAL:  Well-nourished male up in chair in no distress  SKIN: Warm and dry.  Midline chest incision with chest tubes in place  HEAD: Atraumatic, normocephalic.  EYES: Pupils equal and reactive. EOMI.  EARS AND NOSE: Normal to examination.  THROAT: No exudate, no pharyngeal erythema noted.  NECK: Supple, Trachea midline, no lymphadenopathy.  CARDIAC: S1 and S2, no murmurs or gallops. No S3.  LUNGS:  Diminished bilaterally. No wheezes or rhonchi. No accessory muscle use noted.  GI: Abdomen soft,  nontender. Good bowel sounds x 4. No organomegaly appreciated.  GU: No suprapubic tenderness. No costovertebral tenderness.  MUSCULOSKELETAL: Good muscle strength throughout.  EXTREMITIES: No pedal edema noted. Distal pulses equal bilaterally.  NEUROLOGICAL: Cranial nerves grossly intact. No gross focal deficit.  PSYCHIATRIC: Appropriate mood and affect.     Labs:     Results for orders placed or performed during the hospital encounter of 02/23/24 (from the past 24 hours)   POC ACT PLUS (RESULTS)   Result Value Ref Range    ACT PLUS POC 498 (H) On Pump: 480/ Off Pump: 350/ Thoracotomy: >200 seconds   POC ARTERIAL BLOOD GAS HVI (RESULT)   Result Value Ref Range    %FIO2 (ARTERIAL) 100 %    PH (ARTERIAL) 7.36 7.35 - 7.45    PCO2 (ARTERIAL) 48 (H) 35 - 45 mm/Hg    PO2 (ARTERIAL) 218 (H) 83 - 108 mm/Hg    BASE EXCESS (ARTERIAL) 1.5 mmol/L    BICARBONATE (ARTERIAL) 26.1 21.0 - 28.0 mmol/L    SODIUM 134 (L) 136 - 145 mmol/L  WHOLE BLOOD POTASSIUM 4.2 3.5 - 5.1 mmol/L    CHLORIDE 108 (H) 98 - 107 mmol/L    IONIZED CALCIUM  1.27 1.15 - 1.33 mmol/L    GLUCOSE 148 (H) 65 - 125 mg/dL    LACTATE 0.9 <=8.0 mmol/L    HEMOGLOBIN 6.8 (LL) 12.0 - 18.0 g/dL    HEMATOCRITRT 20 (L) 37 - 50 %    OXYHEMOGLOBIN 97.7 (H) 90.0 - 95.0 %    CARBOXYHEMOGLOBIN 1.6 <=3.0 %    MET-HEMOGLOBIN <0.7 <=1.5 %    O2CT 9.9 %    O2 SATURATION (ARTERIAL) 99.4 94.0 - 98.0 %    PAO2/FIO2 RATIO 218     PH (T) 7.36 7.35 - 7.45    (T) PCO2 48.0 (H) 35.0 - 45.0 mm/Hg    (T) PO2 218.0 (H) 83.0 - 108.0 mm/Hg   POC ACT PLUS (RESULTS)   Result Value Ref Range    ACT PLUS POC 500 (H) On Pump: 480/ Off Pump: 350/ Thoracotomy: >200 seconds   POC ARTERIAL BLOOD GAS HVI (RESULT)   Result Value Ref Range    %FIO2 (ARTERIAL) 85 %    PH (ARTERIAL) 7.42 7.35 - 7.45    PCO2 (ARTERIAL) 44 35 - 45 mm/Hg    PO2 (ARTERIAL) 431 (H) 83 - 108 mm/Hg    BASE EXCESS (ARTERIAL) 3.7 mmol/L    BICARBONATE (ARTERIAL) 27.8 21.0 - 28.0 mmol/L    SODIUM 134 (L) 136 - 145 mmol/L    WHOLE  BLOOD POTASSIUM 5.7 (H) 3.5 - 5.1 mmol/L    CHLORIDE 109 (H) 98 - 107 mmol/L    IONIZED CALCIUM  1.15 1.15 - 1.33 mmol/L    GLUCOSE 154 (H) 65 - 125 mg/dL    LACTATE 1.3 <=8.0 mmol/L    HEMOGLOBIN 7.2 (L) 12.0 - 18.0 g/dL    HEMATOCRITRT 22 (L) 37 - 50 %    OXYHEMOGLOBIN 96.7 (H) 90.0 - 95.0 %    CARBOXYHEMOGLOBIN 1.7 <=3.0 %    MET-HEMOGLOBIN 1.5 <=1.5 %    O2CT 11.0 %    O2 SATURATION (ARTERIAL) 99.8 94.0 - 98.0 %    PAO2/FIO2 RATIO 507     PH (T) 7.42 7.35 - 7.45    (T) PCO2 44.0 35.0 - 45.0 mm/Hg    (T) PO2 431.0 (H) 83.0 - 108.0 mm/Hg   POC VENOUS BLOOD GAS HVI (RESULT)   Result Value Ref Range    %FIO2 (VENOUS) 85.0 %    PH (VENOUS) 7.36 7.32 - 7.43    PCO2 (VENOUS) 52 (H) 41 - 51 mm/Hg    PO2 (VENOUS) 50 35 - 50 mm/Hg    BASE EXCESS  3.5 mmol/L    BICARBONATE (VENOUS) 27.5 22.0 - 29.0 mmol/L    SODIUM 135 (L) 136 - 145 mmol/L    WHOLE BLOOD POTASSIUM 5.5 (H) 3.5 - 5.1 mmol/L    CHLORIDE 108 (H) 98 - 107 mmol/L    IONIZED CALCIUM  1.18 1.15 - 1.33 mmol/L    GLUCOSE 150 (H) 65 - 125 mg/dL    LACTATE 1.3 <=8.0 mmol/L    HEMOGLOBIN 7.1 (L) 12.0 - 18.0 g/dL    HEMATOCRITRT 21 (L) 37 - 50 %    OXYHEMOGLOBIN 86.5 40.0 - 80.0 %    CARBOXYHEMOGLOBIN 2.9 <=3.0 %    MET-HEMOGLOBIN 1.0 <=1.5 %    O2CT 8.7 %    O2 SATURATION (VENOUS) 90.0 40.0 - 85.0 %    PH (T) 7.36 7.32 - 7.43    (  T) PCO2 52.0 (H) 41.0 - 51.0 mm/Hg    (T) PO2 50.0 35 - 50 mm/Hg   POC ACT PLUS (RESULTS)   Result Value Ref Range    ACT PLUS POC 444 (H) On Pump: 480/ Off Pump: 350/ Thoracotomy: >200 seconds   POC ARTERIAL BLOOD GAS HVI (RESULT)   Result Value Ref Range    %FIO2 (ARTERIAL) 75 %    PH (ARTERIAL) 7.40 7.35 - 7.45    PCO2 (ARTERIAL) 44 35 - 45 mm/Hg    PO2 (ARTERIAL) 362 (H) 83 - 108 mm/Hg    BASE EXCESS (ARTERIAL) 2.2 mmol/L    BICARBONATE (ARTERIAL) 26.7 21.0 - 28.0 mmol/L    SODIUM 136 136 - 145 mmol/L    WHOLE BLOOD POTASSIUM 5.5 (H) 3.5 - 5.1 mmol/L    CHLORIDE 109 (H) 98 - 107 mmol/L    IONIZED CALCIUM  1.21 1.15 - 1.33 mmol/L    GLUCOSE  159 (H) 65 - 125 mg/dL    LACTATE 1.2 <=8.0 mmol/L    HEMOGLOBIN 7.5 (L) 12.0 - 18.0 g/dL    HEMATOCRITRT 23 (L) 37 - 50 %    OXYHEMOGLOBIN 97.7 (H) 90.0 - 95.0 %    CARBOXYHEMOGLOBIN 1.8 <=3.0 %    MET-HEMOGLOBIN <0.7 <=1.5 %    O2CT 11.3 %    O2 SATURATION (ARTERIAL) 100.0 94.0 - 98.0 %    PAO2/FIO2 RATIO 483     PH (T) 7.40 7.35 - 7.45    (T) PCO2 44.0 35.0 - 45.0 mm/Hg    (T) PO2 362.0 (H) 83.0 - 108.0 mm/Hg   POC ACT PLUS (RESULTS)   Result Value Ref Range    ACT PLUS POC 517 (H) On Pump: 480/ Off Pump: 350/ Thoracotomy: >200 seconds   POC ARTERIAL BLOOD GAS HVI (RESULT)   Result Value Ref Range    %FIO2 (ARTERIAL) 75 %    PH (ARTERIAL) 7.42 7.35 - 7.45    PCO2 (ARTERIAL) 42 35 - 45 mm/Hg    PO2 (ARTERIAL) 294 (H) 83 - 108 mm/Hg    BASE EXCESS (ARTERIAL) 2.5 mmol/L    BICARBONATE (ARTERIAL) 26.9 21.0 - 28.0 mmol/L    SODIUM 136 136 - 145 mmol/L    WHOLE BLOOD POTASSIUM 5.2 (H) 3.5 - 5.1 mmol/L    CHLORIDE 110 (H) 98 - 107 mmol/L    IONIZED CALCIUM  1.19 1.15 - 1.33 mmol/L    GLUCOSE 146 (H) 65 - 125 mg/dL    LACTATE 1.1 <=8.0 mmol/L    HEMOGLOBIN 7.7 (L) 12.0 - 18.0 g/dL    HEMATOCRITRT 23 (L) 37 - 50 %    OXYHEMOGLOBIN 96.6 (H) 90.0 - 95.0 %    CARBOXYHEMOGLOBIN 1.1 <=3.0 %    MET-HEMOGLOBIN 0.8 <=1.5 %    O2CT 11.3 %    O2 SATURATION (ARTERIAL) 98.5 94.0 - 98.0 %    PAO2/FIO2 RATIO 392     PH (T) 7.42 7.35 - 7.45    (T) PCO2 42.0 35.0 - 45.0 mm/Hg    (T) PO2 294.0 (H) 83.0 - 108.0 mm/Hg   POC ACT PLUS (RESULTS)   Result Value Ref Range    ACT PLUS POC 471 (H) On Pump: 480/ Off Pump: 350/ Thoracotomy: >200 seconds   POC ARTERIAL BLOOD GAS HVI (RESULT)   Result Value Ref Range    %FIO2 (ARTERIAL) 85 %    PH (ARTERIAL) 7.39 7.35 - 7.45    PCO2 (ARTERIAL) 44 35 - 45 mm/Hg    PO2 (  ARTERIAL) 339 (H) 83 - 108 mm/Hg    BASE EXCESS (ARTERIAL) 1.4 mmol/L    BICARBONATE (ARTERIAL) 26.1 21.0 - 28.0 mmol/L    SODIUM 136 136 - 145 mmol/L    WHOLE BLOOD POTASSIUM 5.2 (H) 3.5 - 5.1 mmol/L    CHLORIDE 109 (H) 98 - 107 mmol/L     IONIZED CALCIUM  1.21 1.15 - 1.33 mmol/L    GLUCOSE 182 (H) 65 - 125 mg/dL    LACTATE 2.0 (H) <=8.0 mmol/L    HEMOGLOBIN 7.4 (L) 12.0 - 18.0 g/dL    HEMATOCRITRT 22 (L) 37 - 50 %    OXYHEMOGLOBIN 97.2 (H) 90.0 - 95.0 %    CARBOXYHEMOGLOBIN 1.7 <=3.0 %    MET-HEMOGLOBIN 0.8 <=1.5 %    O2CT 11.1 %    O2 SATURATION (ARTERIAL) 99.7 94.0 - 98.0 %    PAO2/FIO2 RATIO 399     PH (T) 7.39 7.35 - 7.45    (T) PCO2 44.0 35.0 - 45.0 mm/Hg    (T) PO2 339.0 (H) 83.0 - 108.0 mm/Hg   POC ACT PLUS (RESULTS)   Result Value Ref Range    ACT PLUS POC 107 On Pump: 480/ Off Pump: 350/ Thoracotomy: >200 seconds   POC ARTERIAL BLOOD GAS HVI (RESULT)   Result Value Ref Range    %FIO2 (ARTERIAL) 100 %    PH (ARTERIAL) 7.41 7.35 - 7.45    PCO2 (ARTERIAL) 41 35 - 45 mm/Hg    PO2 (ARTERIAL) 163 (H) 83 - 108 mm/Hg    BASE EXCESS (ARTERIAL) 1.2 mmol/L    BICARBONATE (ARTERIAL) 25.9 21.0 - 28.0 mmol/L    SODIUM 135 (L) 136 - 145 mmol/L    WHOLE BLOOD POTASSIUM 4.6 3.5 - 5.1 mmol/L    CHLORIDE 110 (H) 98 - 107 mmol/L    IONIZED CALCIUM  1.31 1.15 - 1.33 mmol/L    GLUCOSE 158 (H) 65 - 125 mg/dL    LACTATE 1.7 <=8.0 mmol/L    HEMOGLOBIN 7.2 (L) 12.0 - 18.0 g/dL    HEMATOCRITRT 22 (L) 37 - 50 %    OXYHEMOGLOBIN 97.0 (H) 90.0 - 95.0 %    CARBOXYHEMOGLOBIN 1.9 <=3.0 %    MET-HEMOGLOBIN 0.7 <=1.5 %    O2CT 10.2 %    O2 SATURATION (ARTERIAL) 99.7 94.0 - 98.0 %    PAO2/FIO2 RATIO 163     PH (T) 7.41 7.35 - 7.45    (T) PCO2 41.0 35.0 - 45.0 mm/Hg    (T) PO2 163.0 (H) 83.0 - 108.0 mm/Hg   POC ARTERIAL BLOOD GAS HVI (RESULT)   Result Value Ref Range    %FIO2 (ARTERIAL) 100 %    PH (ARTERIAL) 7.37 7.35 - 7.45    PCO2 (ARTERIAL) 44 35 - 45 mm/Hg    PO2 (ARTERIAL) 155 (H) 83 - 108 mm/Hg    BASE EXCESS (ARTERIAL) 0.0 mmol/L    BICARBONATE (ARTERIAL) 24.9 21.0 - 28.0 mmol/L    SODIUM 136 136 - 145 mmol/L    WHOLE BLOOD POTASSIUM 4.3 3.5 - 5.1 mmol/L    CHLORIDE 110 (H) 98 - 107 mmol/L    IONIZED CALCIUM  1.38 (H) 1.15 - 1.33 mmol/L    GLUCOSE 138 (H) 65 - 125 mg/dL     LACTATE 1.9 <=8.0 mmol/L    HEMOGLOBIN 9.4 (L) 12.0 - 18.0 g/dL    HEMATOCRITRT 28 (L) 37 - 50 %    OXYHEMOGLOBIN 97.0 (H) 90.0 - 95.0 %    CARBOXYHEMOGLOBIN 1.9 <=3.0 %  MET-HEMOGLOBIN 0.7 <=1.5 %    O2CT 13.2 %    O2 SATURATION (ARTERIAL) 99.5 94.0 - 98.0 %    PAO2/FIO2 RATIO 155     PH (T) 7.37 7.35 - 7.45    (T) PCO2 44.0 35.0 - 45.0 mm/Hg    (T) PO2 155.0 (H) 83.0 - 108.0 mm/Hg   ECG 12 LEAD - STAT (POST CARDIAC INTERVENTION FOLLOW-UP)   Result Value Ref Range    Ventricular rate 31 BPM    Atrial Rate 31 BPM    PR Interval 224 ms    QRS Duration 102 ms    QT Interval 444 ms    QTC Calculation 318 ms    Calculated P Axis 37 degrees    Calculated R Axis -55 degrees    Calculated T Axis 71 degrees   POC BLOOD GLUCOSE (RESULTS)   Result Value Ref Range    GLUCOSE, POC 159 (H) 70 - 100 mg/dl   POCT ISTAT CG8 BLOOD GAS ELECTROLYTES HEMATOCRIT (RESULTS)   Result Value Ref Range    SODIUM, POC 139 133 - 144 mmol/L    POTASSIUM, POC 4.4 3.2 - 5.0 mmol/L    IONIZED CALCIUM , POC 1.35 (H) 1.09 - 1.32 mmol/L    GLUCOSE, POC 147 (H) 70 - 110 mg/dl    HEMATOCRIT, POC 75.9 (L) 34.0 - 52.0 %    HEMOGLOBIN, POC 8.2 (L) 11.2 - 18.0 g/dL    PH (PHP) 2.55 2.61 - 7.46    PCO2 (PCO2P) 33 (L) 41 - 51 mmHg    PO2 (PO2P) 195 (H) 74 - 108 mmHg    TCO2 (TOC2P) 24 22 - 32 mmol/L    HCO3 (HCO3P) 23 21 - 29 mmol/L    BASE EXCESS (BEP) -1.0 -2.0 - 2.0 mEq/L    SO2 (SO2P) 100 (H) 92 - 96 %   COMPREHENSIVE METABOLIC PANEL, NON-FASTING   Result Value Ref Range    SODIUM 141 133 - 144 mmol/L    POTASSIUM 4.7 3.2 - 5.0 mmol/L    CHLORIDE 109 (H) 96 - 106 mmol/L    CO2 TOTAL 23 22 - 30 mmol/L    ANION GAP 9 7 - 18 mmol/L    BUN 15 8 - 23 mg/dL    CREATININE 9.05 9.29 - 1.20 mg/dL    ESTIMATED GFR 89 (L) >90 mL/min/1.28m^2    ALBUMIN  2.9 (L) 3.5 - 5.2 g/dL    CALCIUM  9.3 8.3 - 10.7 mg/dL    GLUCOSE 852 (H) 74 - 109 mg/dL    ALKALINE PHOSPHATASE 85 35 - 129 U/L    ALT (SGPT) 8 0 - 41 U/L    AST (SGOT) 30 0 - 40 U/L    BILIRUBIN TOTAL 0.5 0.2 -  1.2 mg/dL    PROTEIN TOTAL 4.5 (L) 6.4 - 8.3 g/dL   PT/INR - ONCE   Result Value Ref Range    PROTHROMBIN TIME 17.3 (H) 12.1 - 15.3 seconds    INR 1.38 (H) 0.86 - 1.14   LACTIC ACID LEVEL W/ REFLEX FOR LEVEL >2.0   Result Value Ref Range    LACTIC ACID 2.7 (H) 0.5 - 2.0 mmol/L   IONIZED CALCIUM  WITH PH   Result Value Ref Range    PH (VENOUS) 7.41 7.32 - 7.43    IONIZED CALCIUM  1.30 1.15 - 1.33 mmol/L   CBC WITH DIFF   Result Value Ref Range    WBC 12.7 (H) 3.7 - 11.0 x10^3/uL    RBC  3.30 (L) 4.50 - 6.10 x10^6/uL    HGB 8.9 (L) 13.4 - 17.5 g/dL    HCT 71.8 (L) 61.0 - 52.0 %    MCV 85.2 78.0 - 100.0 fL    MCH 27.0 26.0 - 32.0 pg    MCHC 31.7 31.0 - 35.5 g/dL    RDW-CV 84.8 88.4 - 84.4 %    PLATELETS 178 150 - 400 x10^3/uL    MPV 9.9 8.7 - 12.5 fL    NEUTROPHIL % 80.4 %    LYMPHOCYTE % 8.5 %    MONOCYTE % 8.7 %    EOSINOPHIL % 1.2 %    BASOPHIL % 0.6 %    NEUTROPHIL # 10.20 (H) 1.50 - 7.70 x10^3/uL    LYMPHOCYTE # 1.08 1.00 - 4.80 x10^3/uL    MONOCYTE # 1.10 0.20 - 1.10 x10^3/uL    EOSINOPHIL # 0.15 <=0.50 x10^3/uL    BASOPHIL # <0.10 <=0.20 x10^3/uL    IMMATURE GRANULOCYTE % 0.6 0.0 - 1.0 %    IMMATURE GRANULOCYTE # <0.10 <0.10 x10^3/uL   POCT ISTAT CG8 BLOOD GAS ELECTROLYTES HEMATOCRIT (RESULTS)   Result Value Ref Range    SODIUM, POC 139 133 - 144 mmol/L    POTASSIUM, POC 4.6 3.2 - 5.0 mmol/L    IONIZED CALCIUM , POC 1.29 1.09 - 1.32 mmol/L    GLUCOSE, POC 175 (H) 70 - 110 mg/dl    HEMATOCRIT, POC 78.9 (L) 34.0 - 52.0 %    HEMOGLOBIN, POC 7.1 (L) 11.2 - 18.0 g/dL    PH (PHP) 2.55 2.61 - 7.46    PCO2 (PCO2P) 35 (L) 41 - 51 mmHg    PO2 (PO2P) 169 (H) 74 - 108 mmHg    TCO2 (TOC2P) 25 22 - 32 mmol/L    HCO3 (HCO3P) 24 21 - 29 mmol/L    BASE EXCESS (BEP) 0.0 -2.0 - 2.0 mEq/L    SO2 (SO2P) 100 (H) 92 - 96 %   LACTIC ACID - FIRST REFLEX   Result Value Ref Range    LACTIC ACID 1.3 0.5 - 2.0 mmol/L   POC BLOOD GLUCOSE (RESULTS)   Result Value Ref Range    GLUCOSE, POC 177 (H) 70 - 100 mg/dl   CBC - ONCE   Result Value Ref  Range    WBC 10.7 3.7 - 11.0 x10^3/uL    RBC 3.04 (L) 4.50 - 6.10 x10^6/uL    HGB 8.0 (L) 13.4 - 17.5 g/dL    HCT 74.6 (L) 61.0 - 52.0 %    MCV 83.2 78.0 - 100.0 fL    MCH 26.3 26.0 - 32.0 pg    MCHC 31.6 31.0 - 35.5 g/dL    RDW-CV 85.1 88.4 - 84.4 %    PLATELETS 174 150 - 400 x10^3/uL    MPV 10.4 8.7 - 12.5 fL   BLOOD GAS/CO-OX Arterial   Result Value Ref Range    %FIO2 (ARTERIAL) 35 %    PH (ARTERIAL) 7.41 7.35 - 7.45    PCO2 (ARTERIAL) 41 35 - 45 mm/Hg    PO2 (ARTERIAL) 194 (H) 83 - 108 mm/Hg    BICARBONATE (ARTERIAL) 25.9 21.0 - 28.0 mmol/L    BASE EXCESS (ARTERIAL) 1.2 0.0 - 3.0 mmol/L    TEMPERATURE, COMP 37.0 15.0 - 40.0 C    PH (T) 7.41 7.35 - 7.45    (T) PCO2 41.0 35.0 - 45.0 mm/Hg    (T) PO2 194.0 (H) 83.0 - 108.0 mm/Hg    PAO2/FIO2 RATIO  554     O2 SATURATION (ARTERIAL) 99.7 94.0 - 98.0 %    HEMOGLOBIN 8.7 (L) 12.0 - 18.0 g/dL    HEMATOCRITRT 26 (L) 37 - 50 %    OXYHEMOGLOBIN 96.8 (H) 90.0 - 95.0 %    CARBOXYHEMOGLOBIN 1.3 <=3.0 %    MET-HEMOGLOBIN 1.1 <=1.5 %    O2CT 12.3 %   POC BLOOD GLUCOSE (RESULTS)   Result Value Ref Range    GLUCOSE, POC 163 (H) 70 - 100 mg/dl   POC BLOOD GLUCOSE (RESULTS)   Result Value Ref Range    GLUCOSE, POC 149 (H) 70 - 100 mg/dl   POC BLOOD GLUCOSE (RESULTS)   Result Value Ref Range    GLUCOSE, POC 146 (H) 70 - 100 mg/dl   POC BLOOD GLUCOSE (RESULTS)   Result Value Ref Range    GLUCOSE, POC 131 (H) 70 - 100 mg/dl   POC BLOOD GLUCOSE (RESULTS)   Result Value Ref Range    GLUCOSE, POC 121 (H) 70 - 100 mg/dl   POC BLOOD GLUCOSE (RESULTS)   Result Value Ref Range    GLUCOSE, POC 103 (H) 70 - 100 mg/dl   POC BLOOD GLUCOSE (RESULTS)   Result Value Ref Range    GLUCOSE, POC 142 (H) 70 - 100 mg/dl   BASIC METABOLIC PANEL   Result Value Ref Range    SODIUM 137 133 - 144 mmol/L    POTASSIUM 5.1 (H) 3.2 - 5.0 mmol/L    CHLORIDE 107 (H) 96 - 106 mmol/L    CO2 TOTAL 22 22 - 30 mmol/L    ANION GAP 8 7 - 18 mmol/L    CALCIUM  8.9 8.3 - 10.7 mg/dL    GLUCOSE 876 (H) 74 - 109 mg/dL    BUN 20  8 - 23 mg/dL    CREATININE 9.01 9.29 - 1.20 mg/dL    BUN/CREA RATIO 20     ESTIMATED GFR 85 (L) >90 mL/min/1.32m^2   MAGNESIUM    Result Value Ref Range    MAGNESIUM  3.4 (H) 1.6 - 2.4 mg/dL   H & H   Result Value Ref Range    HGB 9.7 (L) 13.4 - 17.5 g/dL    HCT 69.4 (L) 61.0 - 52.0 %   IONIZED CALCIUM  WITH PH   Result Value Ref Range    PH (VENOUS) 7.38 7.32 - 7.43    IONIZED CALCIUM  1.25 1.15 - 1.33 mmol/L   BASIC METABOLIC PANEL, NON-FASTING   Result Value Ref Range    SODIUM 135 133 - 144 mmol/L    POTASSIUM 4.8 3.2 - 5.0 mmol/L    CHLORIDE 101 96 - 106 mmol/L    CO2 TOTAL 20 (L) 22 - 30 mmol/L    ANION GAP 14 7 - 18 mmol/L    CALCIUM  9.0 8.3 - 10.7 mg/dL    GLUCOSE 805 (H) 74 - 109 mg/dL    BUN 23 8 - 23 mg/dL    CREATININE 8.92 9.29 - 1.20 mg/dL    BUN/CREA RATIO 21     ESTIMATED GFR 76 (L) >90 mL/min/1.34m^2   MAGNESIUM    Result Value Ref Range    MAGNESIUM  3.1 (H) 1.6 - 2.4 mg/dL   IONIZED CALCIUM    Result Value Ref Range    PH (VENOUS) 7.36 7.32 - 7.43    IONIZED CALCIUM  1.19 1.15 - 1.33 mmol/L   CBC WITH DIFF   Result Value Ref Range    WBC 13.0 (H) 3.7 - 11.0  x10^3/uL    RBC 3.46 (L) 4.50 - 6.10 x10^6/uL    HGB 9.6 (L) 13.4 - 17.5 g/dL    HCT 70.5 (L) 61.0 - 52.0 %    MCV 85.0 78.0 - 100.0 fL    MCH 27.7 26.0 - 32.0 pg    MCHC 32.7 31.0 - 35.5 g/dL    RDW-CV 85.4 88.4 - 84.4 %    PLATELETS 141 (L) 150 - 400 x10^3/uL    MPV 10.4 8.7 - 12.5 fL    NEUTROPHIL % 84.6 %    LYMPHOCYTE % 7.9 %    MONOCYTE % 6.9 %    EOSINOPHIL % 0.0 %    BASOPHIL % 0.2 %    NEUTROPHIL # 10.97 (H) 1.50 - 7.70 x10^3/uL    LYMPHOCYTE # 1.02 1.00 - 4.80 x10^3/uL    MONOCYTE # 0.90 0.20 - 1.10 x10^3/uL    EOSINOPHIL # <0.10 <=0.50 x10^3/uL    BASOPHIL # <0.10 <=0.20 x10^3/uL    IMMATURE GRANULOCYTE % 0.4 0.0 - 1.0 %    IMMATURE GRANULOCYTE # <0.10 <0.10 x10^3/uL   POC BLOOD GLUCOSE (RESULTS)   Result Value Ref Range    GLUCOSE, POC 220 (H) 70 - 100 mg/dl   POC BLOOD GLUCOSE (RESULTS)   Result Value Ref Range    GLUCOSE, POC 148 (H) 70 -  100 mg/dl      Radiology:      Results for orders placed or performed during the hospital encounter of 02/23/24 (from the past 2160 hours)   XR CHEST AP MOBILE     Status: None    Narrative    Jeremy Sexton    XR AP MOBILE CHEST  02/23/2024 1:10 PM    INDICATION:  Immediate Post-Op Exam - evaluation for effusion  evaluation for effusion    TECHNIQUE:  Portable frontal view obtained on 1 image(s).    COMPARISON:  None available.    FINDINGS:    Impression    See impression:      IMPRESSION:    CABG. ET and NG tubes in satisfactory position.   Tubes or drains project over the left mid chest and upper mid abdomen.  Right IJ central venous catheter sheath.      Heart size is normal.  No focal consolidation or effusion.  No pneumothorax or evidence of pneumomediastinum.        Radiologist location ID: [id number]     XR AP MOBILE CHEST     Status: None    Narrative    Jeremy Sexton    XR AP MOBILE CHEST performed on 02/24/2024 4:38 AM.    INDICATION:  Status-Post Cardiothoracic Surgery - evaluation for effusion   Additional History:  follow up    TECHNIQUE:  Single portable frontal view of the chest.  1 views/1 images submitted for interpretation.    COMPARISON:  Yesterday    ___________________________________  FINDINGS:     * Interval extubation and NG tube removal. Similar right central line. Persistent but altered position of the subxiphoid catheters.  * No pneumothorax.  * Similar cardiomediastinal silhouette and aeration.    ___________________________________    Impression    No acute cardiopulmonary findings. Discontinued ETT and NGT. Altered subxiphoid catheter position.         Radiologist location ID: [id number]          Assessment & Plan:  Active Hospital Problems    Diagnosis    Primary Problem: S/P CABG (coronary artery  bypass graft)    Cardiac insufficiency following cardiac surgery    Lactic acidosis    Hypovolemic shock (CMS HCC)      Plan:  Patient seen and examined, chart reviewed  68 y/o with recent  positive cardiac workup and is now status post CABG x5  Extubated and up in chair currently on 2 L nasal cannula, wean as tolerated for target goal sat 90% or greater   Personally reviewed chest x-ray with minimal postop atelectasis and no large effusion  CT management per CTS  Paced at 80, underlying rhythm is sinus in the 60's  Fluid status looks okay with CVP 5  Encourage frequent ambulation and IS use  Off all critical drips  Progressing well postoperatively    This case was discussed with the pulmonary attending who will add any further change in his addendum to this note.    Caitlin B Johnson, APRN-FNP-BC          Attending physician:  I have seen and examined the patient.  Medications, labs, images reviewed.    Pulmonary toilet, incentive spirometer, out of bed as tolerated.  Supplemental oxygen with nebs as needed.   Protonix , SCDs.    Start heparin  subQ for deep vein thrombosis prophylaxis when okay from a surgical and bleeding risk standpoint.    TEE shows EF 50%.  Accu-Cheks with SSI.  Monitor creatinine, urine output, electrolytes and replace as needed.    Continue postop cefazolin .    Continue aspirin , Plavix , Lipitor, metoprolol .  Ensure adequate pain control.     Chest x-ray images reviewed and compared to prior available studies.  No acute focal consolidation or significant effusions.  No pneumothorax.  Support devices in place.     Patient is medically complex with increased risk for sudden decompensation and requires close monitoring of vitals, labs, and/or imaging.    Manus Free, DO   02/24/2024  15:58

## 2024-02-24 NOTE — Care Management Notes (Signed)
 Notified CM of ICU patient has moved to ICU 25 to complete assessment.

## 2024-02-24 NOTE — Care Plan (Signed)
 Problem: Wound  Goal: Optimal Coping  Outcome: Ongoing (see interventions/notes)  Intervention: Support Patient and Family Response  Recent Flowsheet Documentation  Taken 02/23/2024 1900 by Alm GAILS, RN  Supportive Measures: active listening utilized  Goal: Optimal Functional Ability  Outcome: Ongoing (see interventions/notes)  Intervention: Optimize Functional Ability  Recent Flowsheet Documentation  Taken 02/23/2024 2300 by Alm GAILS, RN  Activity Management: up in chair  Activity Assistance Provided: assistance, 2 people  Taken 02/23/2024 1900 by Alm GAILS, RN  Activity Management: up in chair  Activity Assistance Provided: assistance, 2 people  Assistive Device Utilized: team lift  Goal: Absence of Infection Signs and Symptoms  Outcome: Ongoing (see interventions/notes)  Intervention: Prevent or Manage Infection  Recent Flowsheet Documentation  Taken 02/23/2024 2300 by Alm GAILS, RN  Infection Management: aseptic technique maintained  Taken 02/23/2024 1900 by Alm GAILS, RN  Fever Reduction/Comfort Measures: (unable to ajust temperature in room) room temperature adjusted  Infection Management: aseptic technique maintained  Goal: Improved Oral Intake  Outcome: Ongoing (see interventions/notes)  Goal: Optimal Pain Control and Function  Outcome: Ongoing (see interventions/notes)  Intervention: Prevent or Manage Pain  Recent Flowsheet Documentation  Taken 02/23/2024 1900 by Alm GAILS, RN  Sleep/Rest Enhancement: regular sleep/rest pattern promoted  Goal: Skin Health and Integrity  Outcome: Ongoing (see interventions/notes)  Intervention: Optimize Skin Protection  Recent Flowsheet Documentation  Taken 02/23/2024 2300 by Alm GAILS, RN  Pressure Reduction Techniques: Mobility is maximized  Pressure Reduction Devices:   Specialty bed/mattress utilized   Pressure reduction chair cushion utilized  Activity Management: up in chair  Taken 02/23/2024 1900 by Alm GAILS, RN  Pressure Reduction Techniques: Mobility is maximized  Pressure Reduction  Devices:   Specialty bed/mattress utilized   Pressure reduction chair cushion utilized  Activity Management: up in chair  Head of Bed (HOB) Positioning: 30 degrees  Goal: Optimal Wound Healing  Outcome: Ongoing (see interventions/notes)  Intervention: Promote Wound Healing  Recent Flowsheet Documentation  Taken 02/23/2024 2300 by Alm GAILS, RN  Pressure Reduction Techniques: Mobility is maximized  Pressure Reduction Devices:   Specialty bed/mattress utilized   Pressure reduction chair cushion utilized  Activity Management: up in chair  Taken 02/23/2024 1900 by Alm GAILS, RN  Pressure Reduction Techniques: Mobility is maximized  Pressure Reduction Devices:   Specialty bed/mattress utilized   Pressure reduction chair cushion utilized  Sleep/Rest Enhancement: regular sleep/rest pattern promoted  Activity Management: up in chair     Problem: Mechanical Ventilation Invasive  Goal: Effective Communication  Outcome: Ongoing (see interventions/notes)  Intervention: Ensure Effective Communication  Recent Flowsheet Documentation  Taken 02/23/2024 2300 by Alm GAILS, RN  Trust Relationship/Rapport: care explained  Taken 02/23/2024 1900 by Alm GAILS, RN  Trust Relationship/Rapport: care explained  Goal: Optimal Device Function  Outcome: Ongoing (see interventions/notes)  Intervention: Optimize Device Care and Function  Recent Flowsheet Documentation  Taken 02/23/2024 2300 by Alm GAILS, RN  Airway Safety Measures: mask valve resuscitator at bedside  Taken 02/23/2024 1900 by Alm GAILS, RN  Airway Safety Measures: mask valve resuscitator at bedside  Oral Care: oral care with Chlorhexidine  provided  Goal: Mechanical Ventilation Liberation  Outcome: Ongoing (see interventions/notes)  Intervention: Promote Extubation and Mechanical Ventilation Liberation  Recent Flowsheet Documentation  Taken 02/23/2024 1900 by Alm GAILS, RN  Sleep/Rest Enhancement: regular sleep/rest pattern promoted  Environmental Support: calm environment promoted  Goal: Optimal  Nutrition Delivery  Outcome: Ongoing (see interventions/notes)  Goal: Absence of Device-Related Skin and Tissue Injury  Outcome: Ongoing (see interventions/notes)  Intervention: Maintain Skin and Tissue Health  Recent Flowsheet Documentation  Taken 02/23/2024 2300 by Alm GAILS, RN  Device Skin Pressure Protection:   absorbent pad utilized/changed   adhesive use limited  Taken 02/23/2024 1900 by Alm GAILS, RN  Device Skin Pressure Protection:   absorbent pad utilized/changed   adhesive use limited  Goal: Absence of Ventilator-Induced Lung Injury  Outcome: Ongoing (see interventions/notes)  Intervention: Prevent Ventilator-Associated Pneumonia  Recent Flowsheet Documentation  Taken 02/23/2024 1900 by Alm GAILS, RN  Oral Care: oral care with Chlorhexidine  provided  Head of Bed [facility]) Positioning: 30 degrees     Problem: Adult Inpatient Plan of Care  Goal: Plan of Care Review  Outcome: Ongoing (see interventions/notes)  Goal: Patient-Specific Goal (Individualized)  Outcome: Ongoing (see interventions/notes)  Flowsheets (Taken 02/23/2024 1900)  Individualized Care Needs: encourage IS/ambulation  Anxieties, Fears or Concerns: pain management  Goal: Absence of Hospital-Acquired Illness or Injury  Outcome: Ongoing (see interventions/notes)  Intervention: Identify and Manage Fall Risk  Recent Flowsheet Documentation  Taken 02/23/2024 2300 by Alm GAILS, RN  Safety Promotion/Fall Prevention: safety round/check completed  Taken 02/23/2024 1900 by Alm GAILS, RN  Safety Promotion/Fall Prevention: safety round/check completed  Intervention: Prevent Skin Injury  Recent Flowsheet Documentation  Taken 02/24/2024 0000 by Alm GAILS, RN  Body Position: sitting  Taken 02/23/2024 2300 by Alm GAILS, RN  Skin Protection: adhesive use limited  Taken 02/23/2024 2200 by Alm GAILS, RN  Body Position: sitting  Taken 02/23/2024 2000 by Alm GAILS, RN  Body Position: sitting  Taken 02/23/2024 1900 by Alm GAILS, RN  Body Position: sitting  Skin Protection: adhesive use  limited  Intervention: Prevent and Manage VTE (Venous Thromboembolism) Risk  Recent Flowsheet Documentation  Taken 02/23/2024 2300 by Alm GAILS, RN  Sequential Compression Device (SCDs): Sequential compression device on  VTE Prevention/Management:   bleeding risk factor(s) identified, physician notified   dorsiflexion/plantar flexion performed  Taken 02/23/2024 1900 by Alm GAILS, RN  Sequential Compression Device (SCDs): Sequential compression device on  VTE Prevention/Management:   bleeding risk factor(s) identified, physician notified   dorsiflexion/plantar flexion performed  Goal: Optimal Comfort and Wellbeing  Outcome: Ongoing (see interventions/notes)  Intervention: Provide Person-Centered Care  Recent Flowsheet Documentation  Taken 02/23/2024 2300 by Alm GAILS, RN  Trust Relationship/Rapport: care explained  Taken 02/23/2024 1900 by Alm GAILS, RN  Trust Relationship/Rapport: care explained  Goal: Rounds/Family Conference  Outcome: Ongoing (see interventions/notes)     Problem: Skin Injury Risk Increased  Goal: Skin Health and Integrity  Outcome: Ongoing (see interventions/notes)  Intervention: Optimize Skin Protection  Recent Flowsheet Documentation  Taken 02/23/2024 2300 by Alm GAILS, RN  Pressure Reduction Techniques: Mobility is maximized  Pressure Reduction Devices:   Specialty bed/mattress utilized   Pressure reduction chair cushion utilized  Skin Protection: adhesive use limited  Activity Management: up in chair  Taken 02/23/2024 1900 by Alm GAILS, RN  Pressure Reduction Techniques: Mobility is maximized  Pressure Reduction Devices:   Specialty bed/mattress utilized   Pressure reduction chair cushion utilized  Skin Protection: adhesive use limited  Activity Management: up in chair  Head of Bed (HOB) Positioning: 30 degrees     Problem: Cardiovascular Surgery  Goal: Improved Activity Tolerance  Outcome: Ongoing (see interventions/notes)  Intervention: Optimize Tolerance for Activity  Recent Flowsheet Documentation  Taken  02/23/2024 1900 by Alm GAILS, RN  Environmental Support: calm environment promoted  Goal: Optimal Coping with Heart Surgery  Outcome: Ongoing (see interventions/notes)  Intervention: Support Psychosocial Response to Surgery  Recent Flowsheet Documentation  Taken 02/23/2024 1900 by Alm GAILS, RN  Supportive Measures: active listening utilized  Goal: Absence of Bleeding  Outcome: Ongoing (see interventions/notes)  Goal: Effective Bowel Elimination  Outcome: Ongoing (see interventions/notes)  Goal: Effective Cardiac Function  Outcome: Ongoing (see interventions/notes)  Goal: Optimal Cerebral Tissue Perfusion  Outcome: Ongoing (see interventions/notes)  Intervention: Protect and Optimize Cerebral Perfusion  Recent Flowsheet Documentation  Taken 02/23/2024 1900 by Alm GAILS, RN  Fever Reduction/Comfort Measures: (unable to ajust temperature in room) room temperature adjusted  Head of Bed (HOB) Positioning: 30 degrees  Goal: Fluid and Electrolyte Balance  Outcome: Ongoing (see interventions/notes)  Goal: Blood Glucose Level Within Target Range  Outcome: Ongoing (see interventions/notes)  Goal: Absence of Infection Signs and Symptoms  Outcome: Ongoing (see interventions/notes)  Intervention: Prevent or Manage Infection  Recent Flowsheet Documentation  Taken 02/23/2024 1900 by Alm GAILS, RN  Fever Reduction/Comfort Measures: (unable to ajust temperature in room) room temperature adjusted  Goal: Anesthesia/Sedation Recovery  Outcome: Ongoing (see interventions/notes)  Intervention: Optimize Anesthesia Recovery  Recent Flowsheet Documentation  Taken 02/23/2024 2300 by Alm GAILS, RN  Safety Promotion/Fall Prevention: safety round/check completed  Taken 02/23/2024 2200 by Alm GAILS, RN  $ Level Of Assistance: Assisted by Nursing  Treatment Status: Given  Level Incentive Spirometer (mL): 750  Taken 02/23/2024 2100 by Alm GAILS, RN  $ Level Of Assistance: Assisted by Nursing  Treatment Status: Given  Level Incentive Spirometer (mL): 750  Taken  02/23/2024 2000 by Alm GAILS, RN  $ Level Of Assistance: Assisted by Nursing  Treatment Status: Given  Level Incentive Spirometer (mL): 750  Taken 02/23/2024 1900 by Alm GAILS, RN  $ Level Of Assistance: Assisted by Nursing  Treatment Status: Given  Safety Promotion/Fall Prevention: safety round/check completed  Level Incentive Spirometer (mL): 750  Goal: Acceptable Pain Control  Outcome: Ongoing (see interventions/notes)  Goal: Nausea and Vomiting Relief  Outcome: Ongoing (see interventions/notes)  Goal: Effective Urinary Elimination  Outcome: Ongoing (see interventions/notes)  Goal: Effective Oxygenation and Ventilation  Outcome: Ongoing (see interventions/notes)  Intervention: Promote Airway Secretion Clearance  Recent Flowsheet Documentation  Taken 02/23/2024 2200 by Alm GAILS, RN  $ Level Of Assistance: Assisted by Nursing  Treatment Status: Given  Level Incentive Spirometer (mL): 750  Taken 02/23/2024 2100 by Alm GAILS, RN  $ Level Of Assistance: Assisted by Nursing  Treatment Status: Given  Level Incentive Spirometer (mL): 750  Taken 02/23/2024 2000 by Alm GAILS, RN  $ Level Of Assistance: Assisted by Nursing  Treatment Status: Given  Level Incentive Spirometer (mL): 750  Taken 02/23/2024 1900 by Alm GAILS, RN  $ Level Of Assistance: Assisted by Nursing  Treatment Status: Given  Level Incentive Spirometer (mL): 750

## 2024-02-24 NOTE — Nurses Notes (Signed)
 Report called to Luke, RN on ICU without further questions. Pt transported to ICU room 25 while in recliner, connected to portable monitor. Bedside exchange with RN done, pt connected to bedside monitor, 2 lpm 02 via NC, CT connected to suction and a-line and CVP zero'd and reconnected.

## 2024-02-24 NOTE — Care Plan (Signed)
 Problem: Wound  Goal: Optimal Coping  Outcome: Ongoing (see interventions/notes)  Goal: Optimal Functional Ability  Outcome: Ongoing (see interventions/notes)  Goal: Absence of Infection Signs and Symptoms  Outcome: Ongoing (see interventions/notes)  Goal: Improved Oral Intake  Outcome: Ongoing (see interventions/notes)  Goal: Optimal Pain Control and Function  Outcome: Ongoing (see interventions/notes)  Goal: Skin Health and Integrity  Outcome: Ongoing (see interventions/notes)  Goal: Optimal Wound Healing  Outcome: Ongoing (see interventions/notes)     Problem: Adult Inpatient Plan of Care  Goal: Plan of Care Review  Outcome: Ongoing (see interventions/notes)  Goal: Patient-Specific Goal (Individualized)  Outcome: Ongoing (see interventions/notes)  Goal: Absence of Hospital-Acquired Illness or Injury  Outcome: Ongoing (see interventions/notes)  Intervention: Prevent Skin Injury  Recent Flowsheet Documentation  Taken 02/23/2024 1800 by Lyle HERO, RN  Body Position: sitting  Goal: Optimal Comfort and Wellbeing  Outcome: Ongoing (see interventions/notes)  Goal: Rounds/Family Conference  Outcome: Ongoing (see interventions/notes)     Problem: Skin Injury Risk Increased  Goal: Skin Health and Integrity  Outcome: Ongoing (see interventions/notes)     Problem: Cardiovascular Surgery  Goal: Improved Activity Tolerance  Outcome: Ongoing (see interventions/notes)  Goal: Optimal Coping with Heart Surgery  Outcome: Ongoing (see interventions/notes)  Goal: Absence of Bleeding  Outcome: Ongoing (see interventions/notes)  Goal: Effective Bowel Elimination  Outcome: Ongoing (see interventions/notes)  Goal: Effective Cardiac Function  Outcome: Ongoing (see interventions/notes)  Goal: Optimal Cerebral Tissue Perfusion  Outcome: Ongoing (see interventions/notes)  Goal: Fluid and Electrolyte Balance  Outcome: Ongoing (see interventions/notes)  Goal: Blood Glucose Level Within Target Range  Outcome: Ongoing (see  interventions/notes)  Goal: Absence of Infection Signs and Symptoms  Outcome: Ongoing (see interventions/notes)  Goal: Anesthesia/Sedation Recovery  Outcome: Ongoing (see interventions/notes)  Intervention: Optimize Anesthesia Recovery  Recent Flowsheet Documentation  Taken 02/23/2024 1800 by Lyle HERO, RN  $ Level Of Assistance: Needs Encouragement  Treatment Status: Given  Goal: Acceptable Pain Control  Outcome: Ongoing (see interventions/notes)  Goal: Nausea and Vomiting Relief  Outcome: Ongoing (see interventions/notes)  Goal: Effective Urinary Elimination  Outcome: Ongoing (see interventions/notes)  Goal: Effective Oxygenation and Ventilation  Outcome: Ongoing (see interventions/notes)  Intervention: Promote Airway Secretion Clearance  Recent Flowsheet Documentation  Taken 02/23/2024 1800 by Lyle HERO, RN  $ Level Of Assistance: Needs Encouragement  Treatment Status: Given

## 2024-02-24 NOTE — Care Plan (Signed)
 Problem: Wound  Goal: Optimal Coping  Outcome: Ongoing (see interventions/notes)  Intervention: Support Patient and Family Response  Recent Flowsheet Documentation  Taken 02/24/2024 1500 by Suzen SQUIBB, RN  Supportive Measures:   active listening utilized   verbalization of feelings encouraged   self-responsibility promoted   self-reflection promoted   self-care encouraged   relaxation techniques promoted  Taken 02/24/2024 1100 by Suzen SQUIBB, RN  Supportive Measures:   active listening utilized   counseling provided   decision-making supported   goal-setting facilitated   verbalization of feelings encouraged   self-responsibility promoted   self-reflection promoted   self-care encouraged   relaxation techniques promoted   problem-solving facilitated   positive reinforcement provided  Goal: Optimal Functional Ability  Outcome: Ongoing (see interventions/notes)  Intervention: Optimize Functional Ability  Recent Flowsheet Documentation  Taken 02/24/2024 1500 by Suzen SQUIBB, RN  Activity Management:   ambulated in room   returned to bed  Activity Assistance Provided: assistance, 1 person  Taken 02/24/2024 1400 by Suzen SQUIBB, RN  Activity Management: ambulated in hall  Activity Assistance Provided: assistance, 3 or more people  Taken 02/24/2024 1115 by Suzen SQUIBB, RN  Activity Management:   ambulated in room   up in chair  Activity Assistance Provided: assistance, 1 person  Taken 02/24/2024 1100 by Suzen SQUIBB, RN  Activity Management:   ambulated in room   returned to bed  Activity Assistance Provided: assistance, 1 person  Goal: Absence of Infection Signs and Symptoms  Outcome: Ongoing (see interventions/notes)  Intervention: Prevent or Manage Infection  Recent Flowsheet Documentation  Taken 02/24/2024 1500 by Suzen SQUIBB, RN  Fever Reduction/Comfort Measures:   lightweight bedding   lightweight clothing   room temperature adjusted  Infection Management: aseptic technique maintained  Taken 02/24/2024 1100 by Suzen SQUIBB,  RN  Fever Reduction/Comfort Measures:   lightweight bedding   lightweight clothing   room temperature adjusted  Infection Management: aseptic technique maintained  Goal: Improved Oral Intake  Outcome: Ongoing (see interventions/notes)  Goal: Optimal Pain Control and Function  Outcome: Ongoing (see interventions/notes)  Goal: Skin Health and Integrity  Outcome: Ongoing (see interventions/notes)  Intervention: Optimize Skin Protection  Recent Flowsheet Documentation  Taken 02/24/2024 1500 by Suzen SQUIBB, RN  Pressure Reduction Techniques:   Heels elevated off of the bed   Repositioned q 1 hour   Moisture, shear and nutrition are maximized  Pressure Reduction Devices: Repositioning wedges/pillows utilized  Activity Management:   ambulated in room   returned to bed  Taken 02/24/2024 1400 by Suzen SQUIBB, RN  Activity Management: ambulated in hall  Taken 02/24/2024 1115 by Suzen SQUIBB, RN  Activity Management:   ambulated in room   up in chair  Taken 02/24/2024 1100 by Suzen SQUIBB, RN  Pressure Reduction Techniques:   Heels elevated off of the bed   Repositioned q 1 hour   Moisture, shear and nutrition are maximized  Pressure Reduction Devices: Repositioning wedges/pillows utilized  Activity Management:   ambulated in room   returned to bed  Goal: Optimal Wound Healing  Outcome: Ongoing (see interventions/notes)  Intervention: Promote Wound Healing  Recent Flowsheet Documentation  Taken 02/24/2024 1500 by Suzen SQUIBB, RN  Pressure Reduction Techniques:   Heels elevated off of the bed   Repositioned q 1 hour   Moisture, shear and nutrition are maximized  Pressure Reduction Devices: Repositioning wedges/pillows utilized  Activity Management:   ambulated in room   returned to bed  Taken 02/24/2024 1400 by Suzen SQUIBB, RN  Activity Management: ambulated in hall  Taken 02/24/2024 1115 by Suzen SQUIBB, RN  Activity Management:   ambulated in room   up in chair  Taken 02/24/2024 1100 by Suzen SQUIBB, RN  Pressure Reduction Techniques:   Heels  elevated off of the bed   Repositioned q 1 hour   Moisture, shear and nutrition are maximized  Pressure Reduction Devices: Repositioning wedges/pillows utilized  Activity Management:   ambulated in room   returned to bed

## 2024-02-24 NOTE — Care Management Notes (Signed)
 02/24/24 1458   Assessment Details   Assessment Type Admission   Date of Care Management Update 02/24/24   Date of Next DCP Update 02/26/24   Insurance Information/Type   Insurance type Medicare   Employment/Financial   Patient has Prescription Coverage?  Yes   Financial/Environmental Concerns none   Living Environment   Select an age group to open lives with row.  Adult   Lives With sibling(s);other (see comments)  (Son lives next door)   Living Arrangements house   Able to Return to Prior Arrangements yes   Home Safety   Home Assessment: No Problems Identified   Home Accessibility no concerns   Care Management Plan   Discharge Planning Status initial meeting   Projected Discharge Date 02/29/24   Discharge plan discussed with: Patient   Discharge Needs Assessment   Equipment Currently Used at Home none   Equipment Needed After Discharge walker, standard;commode   Discharge Facility/Level of Care Needs Home with Home Health and DME (code 6)   Transportation Available car;family or friend will provide       Spoke with patient in room and son by phone. Patient lives with his sister who is disabled, but patient and son report she is able to provide minimal help at home. The son and neighbors are both available to help with minor patient needs after discharge.     Patient has used home health previously but has forgotten the name. The son will be visiting tomorrow and will provide the home health name and contact information.     Patient has requested a walker and bedside commode, both of which will be ordered closer to discharge.     No additional need reported at this time.

## 2024-02-24 NOTE — Progress Notes (Signed)
 [place] [facility]  Cardiac Surgery   ICU Progress Note    Jeremy Sexton, Jeremy Sexton  Date of Admission:  02/23/2024  Date of Birth:  01-29-1956    Hospital Day:  LOS: 1 day   Post-op Day:  1 Day Post-Op, S/P  CABG x5, LIMA to LAD, SVG to PDA and PLB, SVG to Diagonal, and SVG to OM  Date of Service: 02/24/2024    SUBJECTIVE:   OOB sitting in the chair comfortably  Having some pain this morning    OBJECTIVE:  2 L NC sating in 90's  NSR pacing at 80  CT output 340/12    Vital Signs:  Temp (24hrs) Max:36.2 C (97.2 F)      Temperature: 36.1 C (97 F) (02/23/24 1914)  BP (Non-Invasive): 94/61 (02/24/24 1030)  MAP (Non-Invasive): 73 mmHG (02/24/24 1030)  Heart Rate: 80 (02/24/24 1030)  Respiratory Rate: 20 (02/24/24 1030)  SpO2: 92 % (02/24/24 1030)    Base (Admission) Weight:  Weight: 92.1 kg (203 lb)  Weight:  Weight: 96.3 kg (212 lb 4.9 oz)    Hemodynamics (last 24 hrs):  CVP: 5 MM HG (02/24/24 0700)  CO: 6.1 L/min (02/24/24 0700)  CI: 2.9 (02/24/24 0700)  ART-Line  MAP: 73 mmHg (02/24/24 0830)  SVR: 970 (02/24/24 0700)        Blood Gas Results:  Recent Labs     02/23/24  1131 02/23/24  1227 02/23/24  1321 02/23/24  1639 02/23/24  2310 02/24/24  0411   FI02 100 100  --  35  --   --    PH 7.41 7.37   < > 7.41 7.38 7.36   PCO2 41 44  --  41  --   --    PO2 163* 155*  --  194*  --   --    BICARBONATE 25.9 24.9  --  25.9  --   --    BASEEXCESS 1.2 0.0  --  1.2  --   --     < > = values in this interval not displayed.       Weaning Parameters:    Min. Volume: 14.8 Liters  Resp Rate: 15 Breath Per Minute  Avg. VT: 986.67 mL  FVC: 1383 Liters  NIF: -25 cmH2O  RSBI: 15  Cuff Leaked Assessment: Leak Present;Good Airflow Aucustulated    Current Inpatient Medications:  acetaminophen  (TYLENOL ) tablet, 975 mg, Oral, Q6H  [START ON 02/25/2024] acetaminophen  (TYLENOL ) tablet, 650 mg, Oral, Q4H PRN  [Held by provider] amLODIPine  (NORVASC) tablet, 10 mg, Oral, QAM  ascorbic acid  (VITAMIN C ) tablet, 500 mg, Oral, 2x/day  aspirin   chewable tablet 81 mg, 81 mg, Oral, Daily  atorvastatin  (LIPITOR) tablet, 40 mg, Oral, NIGHTLY  bisacodyl  (DULCOLAX) rectal suppository, 10 mg, Rectal, Daily PRN  buPROPion  (WELLBUTRIN ) tablet, 75 mg, Oral, 2x/day  [Held by provider] carvedilol  (COREG ) tablet, 50 mg, Oral, 2x/day-Food  ceFAZolin  (ANCEF ) 2 g in iso-osmotic 100 mL premix IVPB, 2 g, Intravenous, Q8H  chlorhexidine  gluconate (PERIDEX ) 0.12% mouthwash, 15 mL, Topical, 2x/day  [Held by provider] cloNIDine  (CATAPRES ) tablet, 0.1 mg, Oral, 2x/day  clopidogrel  (PLAVIX ) 75 mg tablet, 75 mg, Oral, Daily  Correction/SSIP insulin  lispro 100 units/mL injection, 2-9 Units, Subcutaneous, Q4H  dextrose  (GLUTOSE) 40% oral gel, 15 g, Oral, Q15 Min PRN  dextrose  50% (0.5 g/mL) injection - syringe, 12.5 g, Intravenous, Q15 Min PRN  docusate sodium  (COLACE) capsule, 100 mg, Oral, 2x/day  EPINEPHrine  (ADRENALIN ) 10 mg in NS 250 mL  infusion, 0-0.3 mcg/kg/min, Intravenous, Continuous  [Held by provider] gabapentin  (NEURONTIN ) capsule 400 mg, 400 mg, Oral, 3x/day  glucagon  (GLUCAGEN) injection 1 mg, 1 mg, IntraMUSCULAR, Once PRN  HYDROmorphone  (DILAUDID ) 0.5 mg/0.5 mL injection, 0.2 mg, Intravenous, Q4H PRN  ipratropium (ATROVENT ) 0.02% nebulizer solution, 0.5 mg, Nebulization, Q4H PRN  [Held by provider] lisinopril  (PRINIVIL ) tablet, 20 mg, Oral, QAM  magnesium  hydroxide (MILK OF MAGNESIA) 400mg  per 5mL oral liquid, 30 mL, Oral, Daily PRN  [Held by provider] metFORMIN  (GLUCOPHAGE ) tablet, 500 mg, Oral, 2x/day-Food  metoprolol  tartrate (LOPRESSOR ) tablet, 6.25 mg, Oral, Q12H  mupirocin  (BACTROBAN ) 2% topical ointment, , Apply Topically, 2x/day  nitroGLYCERIN  50 mg in 250 ml D5W premix infusion, 0-200 mcg/min, Intravenous, Continuous  norepinephrine  (LEVOPHED ) 4 mg in D5W 250 mL premix infusion (16 mcg/mL), 0-0.3 mcg/kg/min, Intravenous, Continuous  NS flush syringe, 3 mL, Intracatheter, Q8HRS  NS flush syringe, 3 mL, Intracatheter, Q1H PRN  NS premix infusion, , Intravenous,  Continuous  ondansetron  (ZOFRAN ) 2 mg/mL injection, 4 mg, Intravenous, Q8H PRN  oxyCODONE  (ROXICODONE ) immediate release tablet, 5 mg, Oral, Q4H PRN  oxyCODONE  (ROXICODONE ) immediate release tablet, 10 mg, Oral, Q4H PRN  pantoprazole  (PROTONIX ) 4 mg/mL injection, 40 mg, Intravenous, Daily  [Held by provider] traZODone  (DESYREL ) tablet, 50 mg, Oral, NIGHTLY  vasopressin  (VASOSTRICT ) 20 Units in NS 100 mL (0.2 units/mL) infusion, 0.01 Units/min, Intravenous, Continuous        Appropriate Home Meds restarted:  Yes    I/O:  I/O last 24 hours to current time:    Intake/Output Summary (Last 24 hours) at 02/24/2024 1117  Last data filed at 02/24/2024 1107  Gross per 24 hour   Intake 3627.29 ml   Output 1610 ml   Net 2017.29 ml     I/O last 3 completed shifts:  In: 3527.29 [P.O.:1000; I.V.:1123.21; Blood:1104.08]  Out: 1850 [Urine:945; Chest Tube:905]      Nutrition/Diet:  DIET CARDIAC (2GM NA, LOW FAT, LOWCHOL)    Hardware (Lines, foley, tubes):   Date Placed Necessity Reviewed  Date Discontinued    Chest Tube #1 07/29 07/30     Chest Tube #2 07/29 07/30     Chest Tube #3 07/29 07/30     Pacer Wires:  monitor 07/29 07/30     Foley (out day number two) 07/29 07/30 07/30                    Labs:  Reviewed:  I have reviewed all lab results.  Ordered:  CBC and BMP in AM      Radiology:    Reviewed:  CXR: FINDINGS:      *Interval extubation and NG tube removal. Similar right central line. Persistent but altered position of the subxiphoid catheters.  *No pneumothorax.  *Similar cardiomediastinal silhouette and aeration.     ___________________________________  IMPRESSION:     No acute cardiopulmonary findings. Discontinued ETT and NGT. Altered subxiphoid catheter position.  Ordered:  CXR in AM    Microbiology:  pre op urine negative    Physical Exam:    Constitutional:  no distress  Respiratory:  Breathing nonlabored, Clear to auscultation bilaterally.   Cardiovascular:  regular rate and rhythm, S1, S2 normal, no murmur, click, rub  or gallop  Gastrointestinal:  non-distended, Soft, non-tender  Musculoskeletal:  Head atraumatic and normocephalic  Neurologic:  Grossly normal. Alert and oriented x3  Psychiatric:  Normal affect, behavior, memory, thought content, judgement, and speech.    ASSESSMENT/PLAN:    CXR stable  Transfer  to ICU  DC foley  DC Aline  Continue Chest tubes and TPW  Continue to pace at 80 for bradycardia  Continue Lopressor  6.25  Give on dose of Toradol  for pain  Progressive ambulation  Aggressive pulmonary toiletry/IS  KUB for potential post-op Ileus   ASA/Plavix /Lipitor  Subq heparin  for DVT ppx  SS for Home Health    Problem List:  Active Hospital Problems    Diagnosis    Primary Problem: S/P CABG (coronary artery bypass graft)    Cardiac insufficiency following cardiac surgery    Lactic acidosis    Hypovolemic shock (CMS HCC)       PT/OT: Yes    Patient has decision making capacity:  Yes    DNR Status:  FULL CODE: ATTEMPT RESUSCITATION/CPR    DVT RISK FACTORS HAVE BEN ASSESSED AND PROPHYLAXIS ORDERED (SEE RUBYONLINE - REFERENCE TOOLS - MD, DVT PROPHY OR POCKET CARD):  YES    Disposition Planning: Home discharge  and [facility]     [place], [place] 02/24/2024 11:17

## 2024-02-24 NOTE — Nurses Notes (Signed)
 02/24/24 2000   Daily Care   Activity Management ambulated to bathroom;ambulated in hall;up in chair;ROM, active encouraged   Activity Assistance Provided assistance, 2 people   Assistive Device Utilized walker   Ambulation Distance (Feet) 100   Symptoms Noted During/After Activity gait unsteady   Elimination Assistance up to toilet;urinal within reach     Pt ambulated to toilet, approximately 80ft were he attempted to urinate and was able to produce about . Pt then ambulated into the hall and passed the nurses station and back to chair. Pt was assisted by myself and 1 other RN, and walker, pt ambulated approximately 112ft. Once back in chair pt was positioned until comfortable. Pt VS were stable throughout. Once back in chair pt was bladder scanned post void, showing about . Pt was then straight cathed producing . Bladder scan completed post SC. . All belongings and call light within reach.

## 2024-02-24 NOTE — Progress Notes (Signed)
 [place] [facility]  Pulmonary Progress Note    Patient Name: Jeremy Sexton  Date: 02/24/2024  Department:  [facility] MEDICINE [facility]  MRN: [id number]  DOB: 1955/11/14      Interval History:   NYHEEM BINETTE is a 68 y.o., White male who presents with a known history of coronary artery disease underwent left heart catheterization by Dr. Neomi that revealed multivessel coronary disease.  He was referred to Dr. Skipper for coronary artery bypass graft.  He underwent procedure today CABG x 5 and was taken to the open heart recovery intubated on IV vasopressors.  We have been asked to see for critical care management.  He is currently intubated I will give review of systems or HPI    7/30- up to chair. Breathing is stable. Pulling on IS.       Current Inpatient Medications:  Current Facility-Administered Medications   Medication Dose Route Frequency    acetaminophen  (TYLENOL ) tablet  975 mg Oral Q6H    [START ON 02/25/2024] acetaminophen  (TYLENOL ) tablet  650 mg Oral Q4H PRN    [Held by provider] amLODIPine  (NORVASC) tablet  10 mg Oral QAM    ascorbic acid  (VITAMIN C ) tablet  500 mg Oral 2x/day    aspirin  chewable tablet 81 mg  81 mg Oral Daily    atorvastatin  (LIPITOR) tablet  40 mg Oral NIGHTLY    bisacodyl  (DULCOLAX) rectal suppository  10 mg Rectal Daily PRN    buPROPion  (WELLBUTRIN ) tablet  75 mg Oral 2x/day    [Held by provider] carvedilol  (COREG ) tablet  50 mg Oral 2x/day-Food    ceFAZolin  (ANCEF ) 2 g in iso-osmotic 100 mL premix IVPB  2 g Intravenous Q8H    chlorhexidine  gluconate (PERIDEX ) 0.12% mouthwash  15 mL Topical 2x/day    [Held by provider] cloNIDine  (CATAPRES ) tablet  0.1 mg Oral 2x/day    clopidogrel  (PLAVIX ) 75 mg tablet  75 mg Oral Daily    Correction/SSIP insulin  lispro 100 units/mL injection  2-9 Units Subcutaneous Q4H    dextrose  (GLUTOSE) 40% oral gel  15 g Oral Q15 Min PRN    dextrose  50% (0.5 g/mL) injection - syringe  12.5 g Intravenous Q15 Min PRN    docusate sodium   (COLACE) capsule  100 mg Oral 2x/day    EPINEPHrine  (ADRENALIN ) 10 mg in NS 250 mL infusion  0-0.3 mcg/kg/min Intravenous Continuous    [Held by provider] gabapentin  (NEURONTIN ) capsule 400 mg  400 mg Oral 3x/day    glucagon  (GLUCAGEN) injection 1 mg  1 mg IntraMUSCULAR Once PRN    HYDROmorphone  (DILAUDID ) 0.5 mg/0.5 mL injection  0.2 mg Intravenous Q4H PRN    ipratropium (ATROVENT ) 0.02% nebulizer solution  0.5 mg Nebulization Q4H PRN    [Held by provider] lisinopril  (PRINIVIL ) tablet  20 mg Oral QAM    magnesium  hydroxide (MILK OF MAGNESIA) 400mg  per 5mL oral liquid  30 mL Oral Daily PRN    [Held by provider] metFORMIN  (GLUCOPHAGE ) tablet  500 mg Oral 2x/day-Food    metoprolol  tartrate (LOPRESSOR ) tablet  6.25 mg Oral Q12H    mupirocin  (BACTROBAN ) 2% topical ointment   Apply Topically 2x/day    nitroGLYCERIN  50 mg in 250 ml D5W premix infusion  0-200 mcg/min Intravenous Continuous    norepinephrine  (LEVOPHED ) 4 mg in D5W 250 mL premix infusion (16 mcg/mL)  0-0.3 mcg/kg/min Intravenous Continuous    NS flush syringe  3 mL Intracatheter Q8HRS    NS flush syringe  3 mL Intracatheter  Q1H PRN    ondansetron  (ZOFRAN ) 2 mg/mL injection  4 mg Intravenous Q8H PRN    oxyCODONE  (ROXICODONE ) immediate release tablet  5 mg Oral Q4H PRN    oxyCODONE  (ROXICODONE ) immediate release tablet  10 mg Oral Q4H PRN    pantoprazole  (PROTONIX ) 4 mg/mL injection  40 mg Intravenous Daily    [Held by provider] traZODone  (DESYREL ) tablet  50 mg Oral NIGHTLY    vasopressin  (VASOSTRICT ) 20 Units in NS 100 mL (0.2 units/mL) infusion  0.01 Units/min Intravenous Continuous         REVIEW OF SYSTEMS:    Constitutional: negative for fevers and chills  Eyes: negative for irritation and redness  Ears, nose, mouth, throat, and face: negative for earaches and hoarseness  Respiratory: negative for hemoptysis or stridor  Cardiovascular: negative for irregular heart beats and syncope  Gastrointestinal: negative for dysphagia and  jaundice  Integument/breast: negative for rash and pruritus  Musculoskeletal:negative for neck pain and bone pain  Behavioral/Psych: negative for anorexia and mood swings     Imaging:   Results for orders placed or performed during the hospital encounter of 02/23/24 (from the past 24 hours)   XR AP MOBILE CHEST     Status: None    Narrative    Manus W Klimowicz    XR AP MOBILE CHEST performed on 02/24/2024 4:38 AM.    INDICATION:  Status-Post Cardiothoracic Surgery - evaluation for effusion   Additional History:  follow up    TECHNIQUE:  Single portable frontal view of the chest.  1 views/1 images submitted for interpretation.    COMPARISON:  Yesterday    ___________________________________  FINDINGS:     * Interval extubation and NG tube removal. Similar right central line. Persistent but altered position of the subxiphoid catheters.  * No pneumothorax.  * Similar cardiomediastinal silhouette and aeration.    ___________________________________    Impression    No acute cardiopulmonary findings. Discontinued ETT and NGT. Altered subxiphoid catheter position.         Radiologist location ID: [id number]     XR KUB     Status: None    Narrative    Dimas W Wagoner    XR KUB performed on 02/24/2024 11:55 AM.    INDICATION:  evaluate bowel r/o ileus   Additional History:  adominal distension, evaluate bowel r/o ileus    TECHNIQUE:  Single supine KUB view of the abdomen.  1 views/1 images submitted for interpretation.    COMPARISON:  None available.  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    Nonspecific bowel gas pattern. No renal calcifications.  ___________________________________    Impression    Nonspecific bowel gas pattern.      Radiologist location ID: [id number]         Labs:  Results for orders placed or performed during the hospital encounter of 02/23/24 (from the past 24 hours)   POCT ISTAT CG8 BLOOD GAS ELECTROLYTES HEMATOCRIT (RESULTS)    Result Value Ref Range    SODIUM, POC 139 133 - 144 mmol/L    POTASSIUM, POC 4.6 3.2 - 5.0 mmol/L    IONIZED CALCIUM , POC 1.29 1.09 - 1.32 mmol/L    GLUCOSE, POC 175 (H) 70 - 110 mg/dl    HEMATOCRIT, POC 78.9 (L) 34.0 - 52.0 %    HEMOGLOBIN, POC 7.1 (L) 11.2 - 18.0 g/dL    PH (PHP) 2.55 2.61 - 7.46    PCO2 (PCO2P) 35 (  L) 41 - 51 mmHg    PO2 (PO2P) 169 (H) 74 - 108 mmHg    TCO2 (TOC2P) 25 22 - 32 mmol/L    HCO3 (HCO3P) 24 21 - 29 mmol/L    BASE EXCESS (BEP) 0.0 -2.0 - 2.0 mEq/L    SO2 (SO2P) 100 (H) 92 - 96 %   LACTIC ACID - FIRST REFLEX   Result Value Ref Range    LACTIC ACID 1.3 0.5 - 2.0 mmol/L   POC BLOOD GLUCOSE (RESULTS)   Result Value Ref Range    GLUCOSE, POC 177 (H) 70 - 100 mg/dl   CBC - ONCE   Result Value Ref Range    WBC 10.7 3.7 - 11.0 x10^3/uL    RBC 3.04 (L) 4.50 - 6.10 x10^6/uL    HGB 8.0 (L) 13.4 - 17.5 g/dL    HCT 74.6 (L) 61.0 - 52.0 %    MCV 83.2 78.0 - 100.0 fL    MCH 26.3 26.0 - 32.0 pg    MCHC 31.6 31.0 - 35.5 g/dL    RDW-CV 85.1 88.4 - 84.4 %    PLATELETS 174 150 - 400 x10^3/uL    MPV 10.4 8.7 - 12.5 fL   BLOOD GAS/CO-OX Arterial   Result Value Ref Range    %FIO2 (ARTERIAL) 35 %    PH (ARTERIAL) 7.41 7.35 - 7.45    PCO2 (ARTERIAL) 41 35 - 45 mm/Hg    PO2 (ARTERIAL) 194 (H) 83 - 108 mm/Hg    BICARBONATE (ARTERIAL) 25.9 21.0 - 28.0 mmol/L    BASE EXCESS (ARTERIAL) 1.2 0.0 - 3.0 mmol/L    TEMPERATURE, COMP 37.0 15.0 - 40.0 C    PH (T) 7.41 7.35 - 7.45    (T) PCO2 41.0 35.0 - 45.0 mm/Hg    (T) PO2 194.0 (H) 83.0 - 108.0 mm/Hg    PAO2/FIO2 RATIO 554     O2 SATURATION (ARTERIAL) 99.7 94.0 - 98.0 %    HEMOGLOBIN 8.7 (L) 12.0 - 18.0 g/dL    HEMATOCRITRT 26 (L) 37 - 50 %    OXYHEMOGLOBIN 96.8 (H) 90.0 - 95.0 %    CARBOXYHEMOGLOBIN 1.3 <=3.0 %    MET-HEMOGLOBIN 1.1 <=1.5 %    O2CT 12.3 %   POC BLOOD GLUCOSE (RESULTS)   Result Value Ref Range    GLUCOSE, POC 163 (H) 70 - 100 mg/dl   POC BLOOD GLUCOSE (RESULTS)   Result Value Ref Range    GLUCOSE, POC 149 (H) 70 - 100 mg/dl   POC BLOOD GLUCOSE  (RESULTS)   Result Value Ref Range    GLUCOSE, POC 146 (H) 70 - 100 mg/dl   POC BLOOD GLUCOSE (RESULTS)   Result Value Ref Range    GLUCOSE, POC 131 (H) 70 - 100 mg/dl   POC BLOOD GLUCOSE (RESULTS)   Result Value Ref Range    GLUCOSE, POC 121 (H) 70 - 100 mg/dl   POC BLOOD GLUCOSE (RESULTS)   Result Value Ref Range    GLUCOSE, POC 103 (H) 70 - 100 mg/dl   POC BLOOD GLUCOSE (RESULTS)   Result Value Ref Range    GLUCOSE, POC 142 (H) 70 - 100 mg/dl   BASIC METABOLIC PANEL   Result Value Ref Range    SODIUM 137 133 - 144 mmol/L    POTASSIUM 5.1 (H) 3.2 - 5.0 mmol/L    CHLORIDE 107 (H) 96 - 106 mmol/L    CO2 TOTAL 22 22 - 30 mmol/L  ANION GAP 8 7 - 18 mmol/L    CALCIUM  8.9 8.3 - 10.7 mg/dL    GLUCOSE 876 (H) 74 - 109 mg/dL    BUN 20 8 - 23 mg/dL    CREATININE 9.01 9.29 - 1.20 mg/dL    BUN/CREA RATIO 20     ESTIMATED GFR 85 (L) >90 mL/min/1.39m^2   MAGNESIUM    Result Value Ref Range    MAGNESIUM  3.4 (H) 1.6 - 2.4 mg/dL   H & H   Result Value Ref Range    HGB 9.7 (L) 13.4 - 17.5 g/dL    HCT 69.4 (L) 61.0 - 52.0 %   IONIZED CALCIUM  WITH PH   Result Value Ref Range    PH (VENOUS) 7.38 7.32 - 7.43    IONIZED CALCIUM  1.25 1.15 - 1.33 mmol/L   BASIC METABOLIC PANEL, NON-FASTING   Result Value Ref Range    SODIUM 135 133 - 144 mmol/L    POTASSIUM 4.8 3.2 - 5.0 mmol/L    CHLORIDE 101 96 - 106 mmol/L    CO2 TOTAL 20 (L) 22 - 30 mmol/L    ANION GAP 14 7 - 18 mmol/L    CALCIUM  9.0 8.3 - 10.7 mg/dL    GLUCOSE 805 (H) 74 - 109 mg/dL    BUN 23 8 - 23 mg/dL    CREATININE 8.92 9.29 - 1.20 mg/dL    BUN/CREA RATIO 21     ESTIMATED GFR 76 (L) >90 mL/min/1.61m^2   MAGNESIUM    Result Value Ref Range    MAGNESIUM  3.1 (H) 1.6 - 2.4 mg/dL   IONIZED CALCIUM    Result Value Ref Range    PH (VENOUS) 7.36 7.32 - 7.43    IONIZED CALCIUM  1.19 1.15 - 1.33 mmol/L   CBC WITH DIFF   Result Value Ref Range    WBC 13.0 (H) 3.7 - 11.0 x10^3/uL    RBC 3.46 (L) 4.50 - 6.10 x10^6/uL    HGB 9.6 (L) 13.4 - 17.5 g/dL    HCT 70.5 (L) 61.0 - 52.0 %    MCV 85.0 78.0  - 100.0 fL    MCH 27.7 26.0 - 32.0 pg    MCHC 32.7 31.0 - 35.5 g/dL    RDW-CV 85.4 88.4 - 84.4 %    PLATELETS 141 (L) 150 - 400 x10^3/uL    MPV 10.4 8.7 - 12.5 fL    NEUTROPHIL % 84.6 %    LYMPHOCYTE % 7.9 %    MONOCYTE % 6.9 %    EOSINOPHIL % 0.0 %    BASOPHIL % 0.2 %    NEUTROPHIL # 10.97 (H) 1.50 - 7.70 x10^3/uL    LYMPHOCYTE # 1.02 1.00 - 4.80 x10^3/uL    MONOCYTE # 0.90 0.20 - 1.10 x10^3/uL    EOSINOPHIL # <0.10 <=0.50 x10^3/uL    BASOPHIL # <0.10 <=0.20 x10^3/uL    IMMATURE GRANULOCYTE % 0.4 0.0 - 1.0 %    IMMATURE GRANULOCYTE # <0.10 <0.10 x10^3/uL   POC BLOOD GLUCOSE (RESULTS)   Result Value Ref Range    GLUCOSE, POC 220 (H) 70 - 100 mg/dl   POC BLOOD GLUCOSE (RESULTS)   Result Value Ref Range    GLUCOSE, POC 148 (H) 70 - 100 mg/dl   POC BLOOD GLUCOSE (RESULTS)   Result Value Ref Range    GLUCOSE, POC 154 (H) 70 - 100 mg/dl     No results found for any visits on 02/23/24 (from the past 96 hours).  Vitals:  02/24/24 1015 02/24/24 1030 02/24/24 1100 02/24/24 1200   BP: (!) 83/55 94/61 101/65 111/81   Pulse: 80 80 80    Resp: (!) 25 20 (!) 11    Temp:       SpO2: 100% 92% 96% 95%   Weight:       Height:       BMI:                 PHYSICAL EXAMINATION:   Constitutional: 67y/o WM, well nourished, NAD    Eyes: PERRLA, Pink Conjunctiva   Ears, Nose, Mouth, and Throat: No masses, trachea midline, no thyromegaly  Lungs: CTA with normal respiratory effort  CV: RRR, No MRGs, Normal PMI  GI: Soft, non-tender with no HSM  Musc: Normal gait and station, no digital cyanosis  Skin: Warm and Dry, No rash, lesion or ulcers  Psych: AAOx3 with appropriate affect    Assessment & Plan        Active Hospital Problems    Diagnosis    Primary Problem: S/P CABG (coronary artery bypass graft)    Cardiac insufficiency following cardiac surgery    Lactic acidosis    Hypovolemic shock (CMS HCC)        PLAN  Patient seen and examined  Chart/labs/meds personally reviewed  CXR images personally reviewed  67y/o s/p CABG with h/o DM and  PVD. Lifelong non smoker. No h/o lung disease.  He is doing well post op  CT mgmt per CTS  Encourage IS  Mobilize as tol.   Titrate supplemental O2 for sats 90-94%  Cont tight glycemic control  Slight bump in WBC today, no fever. He feels well. Observe for now.  The patient was seen with Dr Manya who will provide further recommendations to the addendum.    Jolan Jenkins Gull, APRN-FNP-BC  02/24/2024 13:35       Pulmonary attending:  Patient seen and examined personally by me  Pertinent labs/images/reports were reviewed personally by me  Chart records and history reviewed  Assessment and plan of care reviewed in detail  I performed majority of Medical Decision Making of this visit on this date of service acounting for greater than 50% of the medical visit.  Patient has acute and  chronic illness requiring review of testing, ordering of follow-up testing, and interpretation of test results.      Encourage use of incentive spirometer   Increase activity as tolerated   Oxygen as needed maintain saturation 90% or greater   Tight glycemic control   No infectious symptoms at this time however did have a little bump in the white count, we will continue to follow    Bernardino LITTIE Manya, DO  02/24/2024 18:50

## 2024-02-25 ENCOUNTER — Inpatient Hospital Stay (HOSPITAL_COMMUNITY): Payer: MEDICARE

## 2024-02-25 DIAGNOSIS — T8189XA Other complications of procedures, not elsewhere classified, initial encounter: Secondary | ICD-10-CM

## 2024-02-25 DIAGNOSIS — T148XXA Other injury of unspecified body region, initial encounter: Secondary | ICD-10-CM | POA: Diagnosis present

## 2024-02-25 DIAGNOSIS — J9811 Atelectasis: Secondary | ICD-10-CM

## 2024-02-25 DIAGNOSIS — R571 Hypovolemic shock: Secondary | ICD-10-CM

## 2024-02-25 DIAGNOSIS — E872 Acidosis, unspecified: Secondary | ICD-10-CM

## 2024-02-25 LAB — MAGNESIUM: MAGNESIUM: 3.3 mg/dL — ABNORMAL HIGH (ref 1.6–2.4)

## 2024-02-25 LAB — CBC WITH DIFF
BASOPHIL #: <0.1 x10ˆ3/uL (ref <=0.20)
BASOPHIL %: 0.2 %
EOSINOPHIL #: <0.1 x10ˆ3/uL (ref <=0.50)
EOSINOPHIL %: 0.2 %
HCT: 25.3 % — ABNORMAL LOW (ref 38.9–52.0)
HGB: 8.3 g/dL — ABNORMAL LOW (ref 13.4–17.5)
IMMATURE GRANULOCYTE #: <0.1 x10ˆ3/uL (ref <0.10)
IMMATURE GRANULOCYTE %: 0.5 % (ref 0.0–1.0)
LYMPHOCYTE #: 2.02 x10ˆ3/uL (ref 1.00–4.80)
LYMPHOCYTE %: 16 %
MCH: 27.4 pg (ref 26.0–32.0)
MCHC: 32.8 g/dL (ref 31.0–35.5)
MCV: 83.5 fL (ref 78.0–100.0)
MONOCYTE #: 1.49 x10ˆ3/uL — ABNORMAL HIGH (ref 0.20–1.10)
MONOCYTE %: 11.8 %
MPV: 11 fL (ref 8.7–12.5)
NEUTROPHIL #: 9.01 x10ˆ3/uL — ABNORMAL HIGH (ref 1.50–7.70)
NEUTROPHIL %: 71.3 %
PLATELETS: 148 x10ˆ3/uL — ABNORMAL LOW (ref 150–400)
RBC: 3.03 x10ˆ6/uL — ABNORMAL LOW (ref 4.50–6.10)
RDW-CV: 14.7 % (ref 11.5–15.5)
WBC: 12.6 x10ˆ3/uL — ABNORMAL HIGH (ref 3.7–11.0)

## 2024-02-25 LAB — IONIZED CALCIUM WITH PH
IONIZED CALCIUM: 1.18 mmol/L (ref 1.15–1.33)
PH (VENOUS): 7.39 (ref 7.32–7.43)

## 2024-02-25 LAB — BASIC METABOLIC PANEL
ANION GAP: 14 mmol/L (ref 7–18)
BUN/CREA RATIO: 24
BUN: 33 mg/dL — ABNORMAL HIGH (ref 8–23)
CALCIUM: 8.8 mg/dL (ref 8.3–10.7)
CHLORIDE: 98 mmol/L (ref 96–106)
CO2 TOTAL: 22 mmol/L (ref 22–30)
CREATININE: 1.4 mg/dL — ABNORMAL HIGH (ref 0.70–1.20)
ESTIMATED GFR: 55 mL/min/1.73mˆ2 — ABNORMAL LOW (ref >90)
GLUCOSE: 143 mg/dL — ABNORMAL HIGH (ref 74–109)
POTASSIUM: 4.6 mmol/L (ref 3.2–5.0)
SODIUM: 134 mmol/L (ref 133–144)

## 2024-02-25 LAB — POC BLOOD GLUCOSE (RESULTS)
GLUCOSE, POC: 152 mg/dL — ABNORMAL HIGH (ref 70–100)
GLUCOSE, POC: 156 mg/dL — ABNORMAL HIGH (ref 70–100)
GLUCOSE, POC: 159 mg/dL — ABNORMAL HIGH (ref 70–100)
GLUCOSE, POC: 169 mg/dL — ABNORMAL HIGH (ref 70–100)
GLUCOSE, POC: 196 mg/dL — ABNORMAL HIGH (ref 70–100)
GLUCOSE, POC: 247 mg/dL — ABNORMAL HIGH (ref 70–100)

## 2024-02-25 MED ORDER — TAMSULOSIN 0.4 MG CAPSULE
0.4000 mg | ORAL_CAPSULE | Freq: Every evening | ORAL | Status: DC
Start: 2024-02-25 — End: 2024-02-28
  Administered 2024-02-25 – 2024-02-28 (×4): 0.4 mg via ORAL
  Filled 2024-02-25 (×4): qty 1

## 2024-02-25 MED ORDER — LACTATED RINGERS IV BOLUS
500.0000 mL | INJECTION | Status: AC
Start: 2024-02-25 — End: 2024-02-25
  Administered 2024-02-25: 500 mL via INTRAVENOUS
  Administered 2024-02-25: 0 mL via INTRAVENOUS

## 2024-02-25 MED ORDER — HEPARIN (PORCINE) 5,000 UNIT/ML INJECTION SOLUTION
5000.0000 [IU] | Freq: Three times a day (TID) | INTRAMUSCULAR | Status: DC
Start: 2024-02-25 — End: 2024-02-28
  Administered 2024-02-25 – 2024-02-28 (×8): 5000 [IU] via SUBCUTANEOUS
  Filled 2024-02-25 (×8): qty 1

## 2024-02-25 MED ORDER — POTASSIUM CHLORIDE ER 20 MEQ TABLET,EXTENDED RELEASE(PART/CRYST)
10.0000 meq | ORAL_TABLET | Freq: Two times a day (BID) | ORAL | Status: DC
Start: 2024-02-25 — End: 2024-02-28
  Administered 2024-02-25 – 2024-02-28 (×6): 10 meq via ORAL
  Filled 2024-02-25 (×6): qty 1

## 2024-02-25 MED ORDER — FUROSEMIDE 10 MG/ML INJECTION SOLUTION
20.0000 mg | Freq: Two times a day (BID) | INTRAMUSCULAR | Status: DC
Start: 2024-02-25 — End: 2024-02-28
  Administered 2024-02-25 – 2024-02-27 (×5): 20 mg via INTRAVENOUS
  Filled 2024-02-25 (×5): qty 2

## 2024-02-25 MED ORDER — METOPROLOL TARTRATE 25 MG TABLET
12.5000 mg | ORAL_TABLET | Freq: Two times a day (BID) | ORAL | Status: DC
Start: 2024-02-25 — End: 2024-02-26
  Administered 2024-02-25 – 2024-02-26 (×2): 12.5 mg via ORAL
  Filled 2024-02-25 (×2): qty 1

## 2024-02-25 MED ORDER — SODIUM CHLORIDE 0.9 % IV BOLUS
40.0000 mL | INJECTION | Freq: Once | Status: AC | PRN
Start: 2024-02-25 — End: 2024-02-25

## 2024-02-25 NOTE — Care Plan (Signed)
 Problem: Wound  Goal: Optimal Coping  Outcome: Ongoing (see interventions/notes)  Intervention: Support Patient and Family Response  Recent Flowsheet Documentation  Taken 02/25/2024 1500 by Suzen SQUIBB, RN  Supportive Measures:   active listening utilized   counseling provided   decision-making supported   goal-setting facilitated   verbalization of feelings encouraged   self-responsibility promoted   self-reflection promoted   self-care encouraged   relaxation techniques promoted   problem-solving facilitated   positive reinforcement provided  Taken 02/25/2024 1100 by Suzen SQUIBB, RN  Supportive Measures:   active listening utilized   counseling provided   decision-making supported   goal-setting facilitated   verbalization of feelings encouraged   self-responsibility promoted   self-care encouraged   self-reflection promoted   relaxation techniques promoted   problem-solving facilitated   positive reinforcement provided  Goal: Optimal Functional Ability  Outcome: Ongoing (see interventions/notes)  Intervention: Optimize Functional Ability  Recent Flowsheet Documentation  Taken 02/25/2024 1500 by Suzen SQUIBB, RN  Activity Management:   ambulated in room   returned to bed  Activity Assistance Provided: assistance, 1 person  Taken 02/25/2024 1100 by Suzen SQUIBB, RN  Activity Management:   ambulated in room   returned to bed  Activity Assistance Provided: assistance, 1 person  Taken 02/25/2024 0700 by Suzen SQUIBB, RN  Activity Management:   ambulated in room   returned to bed  Activity Assistance Provided: assistance, 1 person  Goal: Absence of Infection Signs and Symptoms  Outcome: Ongoing (see interventions/notes)  Intervention: Prevent or Manage Infection  Recent Flowsheet Documentation  Taken 02/25/2024 1500 by Suzen SQUIBB, RN  Fever Reduction/Comfort Measures:   lightweight bedding   lightweight clothing   room temperature adjusted  Infection Management: aseptic technique maintained  Taken 02/25/2024 1100 by Suzen SQUIBB,  RN  Fever Reduction/Comfort Measures:   lightweight bedding   lightweight clothing   room temperature adjusted  Infection Management: aseptic technique maintained  Taken 02/25/2024 0700 by Suzen SQUIBB, RN  Infection Management: aseptic technique maintained  Goal: Improved Oral Intake  Outcome: Ongoing (see interventions/notes)  Goal: Optimal Pain Control and Function  Outcome: Ongoing (see interventions/notes)  Intervention: Prevent or Manage Pain  Recent Flowsheet Documentation  Taken 02/25/2024 1100 by Suzen SQUIBB, RN  Sleep/Rest Enhancement:   awakenings minimized   noise level reduced   room darkened  Goal: Skin Health and Integrity  Outcome: Ongoing (see interventions/notes)  Intervention: Optimize Skin Protection  Recent Flowsheet Documentation  Taken 02/25/2024 1500 by Suzen SQUIBB, RN  Pressure Reduction Techniques:   Heels elevated off of the bed   Patient turned q 2 hours   Moisture, shear and nutrition are maximized   Supplemented with small shifts   Frequent weight shifting encouraged   Pressure points protected  Pressure Reduction Devices:   Heel offloading device utilized   Pressure redistributing mattress utilized   Specialty bed/mattress utilized   Repositioning wedges/pillows utilized  Activity Management:   ambulated in room   returned to bed  Taken 02/25/2024 1100 by Suzen SQUIBB, RN  Pressure Reduction Techniques:   Heels elevated off of the bed   Patient turned q 2 hours   Moisture, shear and nutrition are maximized   Supplemented with small shifts   Frequent weight shifting encouraged   Pressure points protected  Pressure Reduction Devices:   Heel offloading device utilized   Pressure redistributing mattress utilized   Specialty bed/mattress utilized   Repositioning wedges/pillows utilized  Activity Management:   ambulated in room  returned to bed  Taken 02/25/2024 0700 by Suzen SQUIBB, RN  Pressure Reduction Techniques:   Heels elevated off of the bed   Repositioned q 1 hour   Moisture, shear and  nutrition are maximized  Pressure Reduction Devices: Repositioning wedges/pillows utilized  Activity Management:   ambulated in room   returned to bed  Goal: Optimal Wound Healing  Outcome: Ongoing (see interventions/notes)  Intervention: Promote Wound Healing  Recent Flowsheet Documentation  Taken 02/25/2024 1500 by Suzen SQUIBB, RN  Pressure Reduction Techniques:   Heels elevated off of the bed   Patient turned q 2 hours   Moisture, shear and nutrition are maximized   Supplemented with small shifts   Frequent weight shifting encouraged   Pressure points protected  Pressure Reduction Devices:   Heel offloading device utilized   Pressure redistributing mattress utilized   Specialty bed/mattress utilized   Repositioning wedges/pillows utilized  Activity Management:   ambulated in room   returned to bed  Taken 02/25/2024 1100 by Suzen SQUIBB, RN  Pressure Reduction Techniques:   Heels elevated off of the bed   Patient turned q 2 hours   Moisture, shear and nutrition are maximized   Supplemented with small shifts   Frequent weight shifting encouraged   Pressure points protected  Pressure Reduction Devices:   Heel offloading device utilized   Pressure redistributing mattress utilized   Specialty bed/mattress utilized   Repositioning wedges/pillows utilized  Sleep/Rest Enhancement:   awakenings minimized   noise level reduced   room darkened  Activity Management:   ambulated in room   returned to bed  Taken 02/25/2024 0700 by Suzen SQUIBB, RN  Pressure Reduction Techniques:   Heels elevated off of the bed   Repositioned q 1 hour   Moisture, shear and nutrition are maximized  Pressure Reduction Devices: Repositioning wedges/pillows utilized  Activity Management:   ambulated in room   returned to bed

## 2024-02-25 NOTE — Progress Notes (Signed)
 [facility]  Cardiac Surgery   Progress Note      Jeremy, Sexton  Date of Admission:  02/23/2024  Date of Birth:  27-May-1956    Hospital Day:  LOS: 2 days   Post-op Day:  2 Days Post-Op, S/P  CABG x5, LIMA to LAD, SVG to PDA and PLB, SVG to Diagonal, and SVG to OM   Date of Service:  02/25/2024    SUBJECTIVE:   Resting in bed  C/O urinary retention  No BM, + flatus    OBJECTIVE:  2 LNC sat 94%  Transused 1 unit of PRBC for post operative blood loss anemia    Vital Signs:  Temp (24hrs) Max:36.7 C (98 F)      Temperature: 36.7 C (98 F) (02/25/24 1500)  BP (Non-Invasive): 136/71 (02/25/24 1500)  MAP (Non-Invasive): 89 mmHG (02/25/24 1500)  Heart Rate: 82 (02/25/24 1500)  Respiratory Rate: 15 (02/25/24 1500)  SpO2: 94 % (02/25/24 1500)    Base (Admission) Weight:  Weight: 92.1 kg (203 lb)  Weight:  Weight: 93 kg (205 lb)    Current Inpatient Medications:  acetaminophen  (TYLENOL ) tablet, 650 mg, Oral, Q4H PRN  [Held by provider] amLODIPine  (NORVASC) tablet, 10 mg, Oral, QAM  ascorbic acid  (VITAMIN C ) tablet, 500 mg, Oral, 2x/day  aspirin  chewable tablet 81 mg, 81 mg, Oral, Daily  atorvastatin  (LIPITOR) tablet, 40 mg, Oral, NIGHTLY  bisacodyl  (DULCOLAX) rectal suppository, 10 mg, Rectal, Daily PRN  buPROPion  (WELLBUTRIN ) tablet, 75 mg, Oral, 2x/day  [Held by provider] carvedilol  (COREG ) tablet, 50 mg, Oral, 2x/day-Food  chlorhexidine  gluconate (PERIDEX ) 0.12% mouthwash, 15 mL, Topical, 2x/day  [Held by provider] cloNIDine  (CATAPRES ) tablet, 0.1 mg, Oral, 2x/day  clopidogrel  (PLAVIX ) 75 mg tablet, 75 mg, Oral, Daily  Correction/SSIP insulin  lispro 100 units/mL injection, 2-9 Units, Subcutaneous, Q4H  dextrose  (GLUTOSE) 40% oral gel, 15 g, Oral, Q15 Min PRN  dextrose  50% (0.5 g/mL) injection - syringe, 12.5 g, Intravenous, Q15 Min PRN  docusate sodium  (COLACE) capsule, 100 mg, Oral, 2x/day  EPINEPHrine  (ADRENALIN ) 10 mg in NS 250 mL infusion, 0-0.3 mcg/kg/min, Intravenous, Continuous  [Held by  provider] gabapentin  (NEURONTIN ) capsule 400 mg, 400 mg, Oral, 3x/day  glucagon  (GLUCAGEN) injection 1 mg, 1 mg, IntraMUSCULAR, Once PRN  HYDROmorphone  (DILAUDID ) 0.5 mg/0.5 mL injection, 0.2 mg, Intravenous, Q4H PRN  ipratropium (ATROVENT ) 0.02% nebulizer solution, 0.5 mg, Nebulization, Q4H PRN  [Held by provider] lisinopril  (PRINIVIL ) tablet, 20 mg, Oral, QAM  magnesium  hydroxide (MILK OF MAGNESIA) 400mg  per 5mL oral liquid, 30 mL, Oral, Daily PRN  [Held by provider] metFORMIN  (GLUCOPHAGE ) tablet, 500 mg, Oral, 2x/day-Food  metoprolol  tartrate (LOPRESSOR ) tablet, 6.25 mg, Oral, Q12H  mupirocin  (BACTROBAN ) 2% topical ointment, , Apply Topically, 2x/day  nitroGLYCERIN  50 mg in 250 ml D5W premix infusion, 0-200 mcg/min, Intravenous, Continuous  norepinephrine  (LEVOPHED ) 4 mg in D5W 250 mL premix infusion (16 mcg/mL), 0-0.3 mcg/kg/min, Intravenous, Continuous  NS bolus infusion 40 mL, 40 mL, Intravenous, Once PRN  NS flush syringe, 3 mL, Intracatheter, Q8HRS  NS flush syringe, 3 mL, Intracatheter, Q1H PRN  ondansetron  (ZOFRAN ) 2 mg/mL injection, 4 mg, Intravenous, Q8H PRN  oxyCODONE  (ROXICODONE ) immediate release tablet, 5 mg, Oral, Q4H PRN  oxyCODONE  (ROXICODONE ) immediate release tablet, 10 mg, Oral, Q4H PRN  pantoprazole  (PROTONIX ) 4 mg/mL injection, 40 mg, Intravenous, Daily  tamsulosin  (FLOMAX ) capsule, 0.4 mg, Oral, Daily after Dinner  [Held by provider] traZODone  (DESYREL ) tablet, 50 mg, Oral, NIGHTLY  vasopressin  (VASOSTRICT ) 20 Units in NS 100 mL (0.2  units/mL) infusion, 0.01 Units/min, Intravenous, Continuous        Appropriate Home Meds restarted:  Yes    I/O:  I/O last 24 hours to current time:    Intake/Output Summary (Last 24 hours) at 02/25/2024 1643  Last data filed at 02/25/2024 1400  Gross per 24 hour   Intake 1950 ml   Output 1600 ml   Net 350 ml     I/O last 3 completed shifts:  In: 2050 [P.O.:240; I.V.:1510]  Out: 1080 [Urine:600; Chest Tube:480]    Nutrition/Diet:  DIET CARDIAC (2GM NA, LOW FAT,  LOWCHOL)    Hardware (foley):   Date Placed Necessity Reviewed  Date Discontinued    Foley (out day number two)                      Labs:  Reviewed:  I have reviewed all lab results.  Ordered:  CBC, BMP in AM     Radiology:    Reviewed:  FINDINGS:      *Similar support devices.  *Similar cardiomediastinal silhouette.   *No pneumothorax or large pleural effusion.   *Similar streaky perihilar and right basal atelectasis. No pulmonary edema or new consolidation.     IMPRESSION:  No significant interval change.  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 Steen changed RLE dressing, Every other day dressing change orders placed for prisma dry gauze dressing.   -ASA, Plavix , lopressor , Lipitor  -Increase BB to 12.5mg  PO BID  -Start diuresis at 20 mg IV BID  -Ambulate QID, OOB to chair, PT, IS  -SS for home health    Sari Burrows, FNP-BC 02/25/2024 16:43

## 2024-02-25 NOTE — Nurses Notes (Signed)
 02/25/24 0400   Urine Assessment   Bladder Scan Performed Y   Estimated Bladder Volume 312 mL     Reached out to M. Pauley, NP with pulm. Advised of pt not able to void and results of bladder scan. Orders to rescan at 0600.   Pt was also stood for about 10 mins at this time to try and assist with urination. Pt voided about . Pt was then returned to chair. Call light and personal belongings within reach.

## 2024-02-25 NOTE — Progress Notes (Signed)
 [place] [facility]  Progress Note    [name]  Date of service: 02/25/2024   Date of Admission:  02/23/2024    Hospital Day:  LOS: 2 days     Chief complaint: mild pain/sob    Subjective: Jeremy Sexton is a 68 y.o., White male who presents with a known history of coronary artery disease underwent left heart catheterization by Dr. Neomi that revealed multivessel coronary disease.  He was referred to Dr. Skipper for coronary artery bypass graft.  He underwent procedure today CABG x 5 and was taken to the open heart recovery intubated on IV vasopressors.  We have been asked to see for critical care management.  He is currently intubated I will give review of systems or HPI     7/30- up to chair. Breathing is stable. Pulling on IS.    7/31: up to chair today. 2L NC - Cr. Bumped up, having urinary retention issues. Was given fluid bolus, for foley placement today.     ROS  Gen: No fever, aches, chills  HEENT: No headache ,nasal congestion, sore throat  Lungs: +mild SOB, cough  Heart: No chest pain, palpitations, syncope  Skin: No rashes, lesions, pruritus      Vital Signs:  Temp  Avg: 36.6 C (97.8 F)  Min: 36.5 C (97.7 F)  Max: 36.6 C (97.9 F)    Pulse  Avg: 75.6  Min: 71  Max: 86 BP  Min: 110/69  Max: 133/66   Resp  Avg: 11.7  Min: 8  Max: 22 SpO2  Avg: 94.4 %  Min: 90 %  Max: 99 %          Input/Output    Intake/Output Summary (Last 24 hours) at 02/25/2024 1145  Last data filed at 02/25/2024 0653  Gross per 24 hour   Intake 1830 ml   Output 920 ml   Net 910 ml    I/O last shift:  No intake/output data recorded.   acetaminophen  (TYLENOL ) tablet, 650 mg, Oral, Q4H PRN  [Held by provider] amLODIPine  (NORVASC) tablet, 10 mg, Oral, QAM  ascorbic acid  (VITAMIN C ) tablet, 500 mg, Oral, 2x/day  aspirin  chewable tablet 81 mg, 81 mg, Oral, Daily  atorvastatin  (LIPITOR) tablet, 40 mg, Oral, NIGHTLY  bisacodyl  (DULCOLAX) rectal suppository, 10 mg, Rectal, Daily PRN  buPROPion  (WELLBUTRIN ) tablet, 75 mg,  Oral, 2x/day  [Held by provider] carvedilol  (COREG ) tablet, 50 mg, Oral, 2x/day-Food  chlorhexidine  gluconate (PERIDEX ) 0.12% mouthwash, 15 mL, Topical, 2x/day  [Held by provider] cloNIDine  (CATAPRES ) tablet, 0.1 mg, Oral, 2x/day  clopidogrel  (PLAVIX ) 75 mg tablet, 75 mg, Oral, Daily  Correction/SSIP insulin  lispro 100 units/mL injection, 2-9 Units, Subcutaneous, Q4H  dextrose  (GLUTOSE) 40% oral gel, 15 g, Oral, Q15 Min PRN  dextrose  50% (0.5 g/mL) injection - syringe, 12.5 g, Intravenous, Q15 Min PRN  docusate sodium  (COLACE) capsule, 100 mg, Oral, 2x/day  EPINEPHrine  (ADRENALIN ) 10 mg in NS 250 mL infusion, 0-0.3 mcg/kg/min, Intravenous, Continuous  [Held by provider] gabapentin  (NEURONTIN ) capsule 400 mg, 400 mg, Oral, 3x/day  glucagon  (GLUCAGEN) injection 1 mg, 1 mg, IntraMUSCULAR, Once PRN  HYDROmorphone  (DILAUDID ) 0.5 mg/0.5 mL injection, 0.2 mg, Intravenous, Q4H PRN  ipratropium (ATROVENT ) 0.02% nebulizer solution, 0.5 mg, Nebulization, Q4H PRN  [Held by provider] lisinopril  (PRINIVIL ) tablet, 20 mg, Oral, QAM  magnesium  hydroxide (MILK OF MAGNESIA) 400mg  per 5mL oral liquid, 30 mL, Oral, Daily PRN  [Held by provider] metFORMIN  (GLUCOPHAGE ) tablet, 500 mg, Oral, 2x/day-Food  metoprolol  tartrate (  LOPRESSOR ) tablet, 6.25 mg, Oral, Q12H  mupirocin  (BACTROBAN ) 2% topical ointment, , Apply Topically, 2x/day  nitroGLYCERIN  50 mg in 250 ml D5W premix infusion, 0-200 mcg/min, Intravenous, Continuous  norepinephrine  (LEVOPHED ) 4 mg in D5W 250 mL premix infusion (16 mcg/mL), 0-0.3 mcg/kg/min, Intravenous, Continuous  NS bolus infusion 40 mL, 40 mL, Intravenous, Once PRN  NS flush syringe, 3 mL, Intracatheter, Q8HRS  NS flush syringe, 3 mL, Intracatheter, Q1H PRN  ondansetron  (ZOFRAN ) 2 mg/mL injection, 4 mg, Intravenous, Q8H PRN  oxyCODONE  (ROXICODONE ) immediate release tablet, 5 mg, Oral, Q4H PRN  oxyCODONE  (ROXICODONE ) immediate release tablet, 10 mg, Oral, Q4H PRN  pantoprazole  (PROTONIX ) 4 mg/mL injection, 40 mg,  Intravenous, Daily  tamsulosin  (FLOMAX ) capsule, 0.4 mg, Oral, Daily after Dinner  [Held by provider] traZODone  (DESYREL ) tablet, 50 mg, Oral, NIGHTLY  vasopressin  (VASOSTRICT ) 20 Units in NS 100 mL (0.2 units/mL) infusion, 0.01 Units/min, Intravenous, Continuous        Physical Exam:  Constitutional: awake, alert, appears stated age. No acute distress noted.    Eyes: Pupils equal and round, reactive to light and accomodation. , Sclera non-icteric. , normal findings: lids and lashes normal and conjunctivae and sclerae normal  ENT: Head atraumatic and normocephalic, External Ears: normal pinnae shape and position and no signs of inflammation, Nose without erythema. , Mouth mucous membranes moist. , No oral lesions.   Neck: no thyromegaly or lymphadenopathy, supple, symmetrical, trachea midline, and no adenopathy.   Respiratory:   Clear with diminished bases, chest tube in place  Cardiovascular: regular rate and rhythm, S1, S2 normal.    Gastrointestinal: non-distended, Soft, non-tender, Bowel sounds normal, No hepatosplenomegaly, No masses.    Musculoskeletal:  moves all 4 extremities , no edema noted.   Integumentary:  Skin warm and dry and Skin color, texture, turgor normal. No rashes or lesions  Neurologic: Grossly normal.  Following commands, CN II - XII grossly intact , No tremor  Lymphatic/Immunologic/Hematologic: No lymphadenopathy  Psychiatric:  Pleasant affect.  Insight and judgment seem intact.      Labs:     Results for orders placed or performed during the hospital encounter of 02/23/24 (from the past 24 hours)   POC BLOOD GLUCOSE (RESULTS)   Result Value Ref Range    GLUCOSE, POC 154 (H) 70 - 100 mg/dl   POC BLOOD GLUCOSE (RESULTS)   Result Value Ref Range    GLUCOSE, POC 149 (H) 70 - 100 mg/dl   POC BLOOD GLUCOSE (RESULTS)   Result Value Ref Range    GLUCOSE, POC 191 (H) 70 - 100 mg/dl   POC BLOOD GLUCOSE (RESULTS)   Result Value Ref Range    GLUCOSE, POC 165 (H) 70 - 100 mg/dl   POC BLOOD GLUCOSE  (RESULTS)   Result Value Ref Range    GLUCOSE, POC 156 (H) 70 - 100 mg/dl   BASIC METABOLIC PANEL, NON-FASTING   Result Value Ref Range    SODIUM 134 133 - 144 mmol/L    POTASSIUM 4.6 3.2 - 5.0 mmol/L    CHLORIDE 98 96 - 106 mmol/L    CO2 TOTAL 22 22 - 30 mmol/L    ANION GAP 14 7 - 18 mmol/L    CALCIUM  8.8 8.3 - 10.7 mg/dL    GLUCOSE 856 (H) 74 - 109 mg/dL    BUN 33 (H) 8 - 23 mg/dL    CREATININE 8.59 (H) 0.70 - 1.20 mg/dL    BUN/CREA RATIO 24     ESTIMATED GFR 55 (  L) >90 mL/min/1.35m^2   MAGNESIUM    Result Value Ref Range    MAGNESIUM  3.3 (H) 1.6 - 2.4 mg/dL   IONIZED CALCIUM    Result Value Ref Range    PH (VENOUS) 7.39 7.32 - 7.43    IONIZED CALCIUM  1.18 1.15 - 1.33 mmol/L   CBC WITH DIFF   Result Value Ref Range    WBC 12.6 (H) 3.7 - 11.0 x10^3/uL    RBC 3.03 (L) 4.50 - 6.10 x10^6/uL    HGB 8.3 (L) 13.4 - 17.5 g/dL    HCT 74.6 (L) 61.0 - 52.0 %    MCV 83.5 78.0 - 100.0 fL    MCH 27.4 26.0 - 32.0 pg    MCHC 32.8 31.0 - 35.5 g/dL    RDW-CV 85.2 88.4 - 84.4 %    PLATELETS 148 (L) 150 - 400 x10^3/uL    MPV 11.0 8.7 - 12.5 fL    NEUTROPHIL % 71.3 %    LYMPHOCYTE % 16.0 %    MONOCYTE % 11.8 %    EOSINOPHIL % 0.2 %    BASOPHIL % 0.2 %    NEUTROPHIL # 9.01 (H) 1.50 - 7.70 x10^3/uL    LYMPHOCYTE # 2.02 1.00 - 4.80 x10^3/uL    MONOCYTE # 1.49 (H) 0.20 - 1.10 x10^3/uL    EOSINOPHIL # <0.10 <=0.50 x10^3/uL    BASOPHIL # <0.10 <=0.20 x10^3/uL    IMMATURE GRANULOCYTE % 0.5 0.0 - 1.0 %    IMMATURE GRANULOCYTE # <0.10 <0.10 x10^3/uL   POC BLOOD GLUCOSE (RESULTS)   Result Value Ref Range    GLUCOSE, POC 169 (H) 70 - 100 mg/dl      Radiology:      Results for orders placed or performed during the hospital encounter of 02/23/24 (from the past 2160 hours)   XR CHEST AP MOBILE     Status: None    Narrative    [place] [facility] [place]    XR AP MOBILE CHEST  02/23/2024 1:10 PM    INDICATION:  Immediate Post-Op Exam - evaluation for effusion  evaluation for effusion    TECHNIQUE:  Portable frontal view obtained on 1 image(s).    COMPARISON:  None  available.    FINDINGS:    Impression    See impression:      IMPRESSION:    CABG. ET and NG tubes in satisfactory position.   Tubes or drains project over the left mid chest and upper mid abdomen.  Right IJ central venous catheter sheath.      Heart size is normal.  No focal consolidation or effusion.  No pneumothorax or evidence of pneumomediastinum.        Radiologist location ID: [id number]     XR AP MOBILE CHEST     Status: None    Narrative    [place] [facility] [place]    XR AP MOBILE CHEST performed on 02/24/2024 4:38 AM.    INDICATION:  Status-Post Cardiothoracic Surgery - evaluation for effusion   Additional History:  follow up    TECHNIQUE:  Single portable frontal view of the chest.  1 views/1 images submitted for interpretation.    COMPARISON:  Yesterday    ___________________________________  FINDINGS:     * Interval extubation and NG tube removal. Similar right central line. Persistent but altered position of the subxiphoid catheters.  * No pneumothorax.  * Similar cardiomediastinal silhouette and aeration.    ___________________________________    Impression    No acute cardiopulmonary findings.  Discontinued ETT and NGT. Altered subxiphoid catheter position.         Radiologist location ID: [id number]     XR KUB     Status: None    Narrative    [place] [facility] [place]    XR KUB performed on 02/24/2024 11:55 AM.    INDICATION:  evaluate bowel r/o ileus   Additional History:  adominal distension, evaluate bowel r/o ileus    TECHNIQUE:  Single supine KUB view of the abdomen.  1 views/1 images submitted for interpretation.    COMPARISON:  None available.  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    Nonspecific bowel gas pattern. No renal calcifications.  ___________________________________    Impression    Nonspecific bowel gas pattern.      Radiologist location ID: [id number]     XR AP MOBILE CHEST     Status: None    Narrative    [facility] [place]    XR  AP MOBILE CHEST performed on 02/25/2024 4:55 AM.    INDICATION:  Status-Post Cardiothoracic Surgery - evaluation for effusion   Additional History:  Status-Post Cardiothoracic Surgery - evaluation for effusion    TECHNIQUE:  Single portable frontal view of the chest.  1 views/1 images submitted for interpretation.    COMPARISON:  Yesterday    ___________________________________  FINDINGS:     * Similar support devices.  * Similar cardiomediastinal silhouette.   * No pneumothorax or large pleural effusion.   * Similar streaky perihilar and right basal atelectasis. No pulmonary edema or new consolidation.    ___________________________________    Impression    No significant interval change.           Radiologist location ID: [id number]        Consults:   Assessment/ Plan:   Active Hospital Problems    Diagnosis    Primary Problem: S/P CABG (coronary artery bypass graft)    Nonhealing nonsurgical wound with fat layer exposed    Cardiac insufficiency following cardiac surgery    Lactic acidosis    Hypovolemic shock (CMS HCC)     Patient seen and examined  Chart reviewed  Tele shows sinus  CXR reviewed, mild atelectasis  67y/o s/p CABG with h/o DM and PVD. Lifelong non smoker. No h/o lung disease.  Up to chair and using IS  Chest tube in place, with some bloody drainage  CT mgmt per CTS  Pt with urinary retention overnight, requiring foley catheter and starting flomax   Creatinine bumped  S/p fluid bolus  Mobilize as tol.   Wean O2 as able for SpO2 >/=92%  Will discuss with my attending      Larraine LITTIE Marc, APRN-FNP-BC,      Pulmonary attending:  Patient seen and examined personally by me  Pertinent labs/images/reports were reviewed personally by me  Chart records and history reviewed  Assessment and plan of care reviewed in detail  I performed majority of Medical Decision Making of this visit on this date of service acounting for greater than 50% of the medical visit.  Patient has acute and  chronic illness  requiring review of testing, ordering of follow-up testing, and interpretation of test results.      Encourage use of incentive spirometer   Increase activity as tolerated   Chest tube management per CT surgery   Continue IV fluids   Monitor creatinine   Oxygen as needed maintain saturation 90% or greater   Discussed  with the bedside nurse   Encouraged on increasing activity and ambulation    Bernardino LITTIE Hoyle, DO  02/25/2024 13:21

## 2024-02-25 NOTE — Care Plan (Signed)
 [facility]   [address]  [place], [address]  (Office) [phone]  Rehabilitation Department      Physical Therapy Daily Inpatient Note    Date: 02/25/2024  Patient's Name: Jeremy Sexton  Date of Birth: September 01, 1955  Height: Height: 182.9 cm (6' 0.01)  Weight: Weight: 93 kg (205 lb)      Chart Review Performed:  yes  Post Acute Care Recommendations: HHPT    Plan: Will continue under current POC.   Frequency: Patient will be seen daily 5 times per week M-F        Subjective/Objective/Assessment:    Pain Level: 7/10    Flowsheet    02/25/24 0845   Rehab Session   Document Type therapy progress note (daily note)   PT Visit Date 02/25/24   Total PT Minutes: 15   Patient Effort good   General Information   Patient Profile Reviewed yes   Medical Lines Telemetry;Chest Tube;Arterial Line   Respiratory Status nasal cannula   Existing Precautions/Restrictions sternal precautions;fall precautions   Living Environment   Lives With sibling(s)   Pre Treatment Status   Pre Treatment Patient Status Patient sitting in bedside chair or w/c   Support Present Pre Treatment  Nurse present   Vital Signs   Pre SpO2 (%) 93   O2 Delivery Pre Treatment supplemental O2   O2 Liters Pre Treatment 2   Post SpO2 (%) 93   O2 Delivery Post Treatment supplemental O2   O2 Liters Post Treatment 2   Pain Assessment   Pretreatment Pain Rating 7/10   Posttreatment Pain Rating 7/10   Pre/Posttreatment Pain Comment nursing aware   Bed Mobility   Sit to Supine, Independence moderate assist (50% patient effort)   Transfer Assessment/Treatment   Sit-Stand Independence minimum assist (75% patient effort)   Stand-Sit Independence contact guard assist   Sit-Stand-Sit, Assist Device walker, front wheeled   Gait Assessment/Treatment   Independence  contact guard assist   Assistive Device  walker, front wheeled   Distance in Feet 155   Comment ambulates with FWW and O2 ~ 188ft with 1 standing rest break due to fatigue. O2  sats 93% and HR=70-80s. Returned to bed per nursing request.   Post Treatment Status   Post Treatment Patient Status Patient supine in bed   Support Present Post Treatment  Nurse present   Plan of Care Review   Plan Of Care Reviewed With patient   Physical Therapy Clinical Impression   Assessment good effort. Ambulates with FWW and O2 ~ 139ft with CGA and demonstrates decreased endurance and difficutly with mobility. Waiting for d/c home with HHPT.   Rehab Potential good   Predicted Duration of Therapy Intervention (days/wks) until discharge   Anticipated Equipment Needs at Discharge (PT) front wheeled walker   Anticipated Discharge Disposition home with home health   Planned Therapy Interventions, PT Eval   Planned Therapy Interventions (PT) gait training;transfer training;strengthening                 Intervention minutes: GAIT TRAINING 15 MINUTES    Nursing Notified: yes  Patient Left:  Back to Bed    THERAPIST  Tully Sous, PT  02/25/2024, 12:44
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
## 2024-02-25 NOTE — Nurses Notes (Signed)
 02/25/24 2110   Daily Care   Activity Management ROM, active encouraged;returned to bed   Activity Assistance Provided assistance, 1 person   Ambulation Distance (Feet) 10   Symptoms Noted During/After Activity none     Pt returned to bed. Taking approximately 19ft. Offered to walk pt, and he stated that he walked 6 times today, and did not feel like walking anymore. Pt was then returned to bed. Call light and all personal belongings within reach.

## 2024-02-25 NOTE — Care Management Notes (Signed)
 This CM met with patient at bedside to discuss discharge planning needs.  Patient informed his family will bring his phone to him this evening. Once patient receives his phone he can inform his choice for home health agency.  Will follow-up with patient in am.  Patient also request a BSC and walker for home use.  Patient denies preference in DME provider.  Will monitor and follow-up as needed.

## 2024-02-26 ENCOUNTER — Inpatient Hospital Stay (HOSPITAL_COMMUNITY): Payer: MEDICARE

## 2024-02-26 DIAGNOSIS — Z45018 Encounter for adjustment and management of other part of cardiac pacemaker: Secondary | ICD-10-CM

## 2024-02-26 DIAGNOSIS — Z452 Encounter for adjustment and management of vascular access device: Secondary | ICD-10-CM

## 2024-02-26 DIAGNOSIS — N179 Acute kidney failure, unspecified: Secondary | ICD-10-CM

## 2024-02-26 LAB — BASIC METABOLIC PANEL
ANION GAP: 10 mmol/L (ref 7–18)
BUN/CREA RATIO: 29
BUN: 30 mg/dL — ABNORMAL HIGH (ref 8–23)
CALCIUM: 8.7 mg/dL (ref 8.3–10.7)
CHLORIDE: 103 mmol/L (ref 96–106)
CO2 TOTAL: 23 mmol/L (ref 22–30)
CREATININE: 1.02 mg/dL (ref 0.70–1.20)
ESTIMATED GFR: 81 mL/min/1.73mˆ2 — ABNORMAL LOW (ref >90)
GLUCOSE: 125 mg/dL — ABNORMAL HIGH (ref 74–109)
POTASSIUM: 4.4 mmol/L (ref 3.2–5.0)
SODIUM: 136 mmol/L (ref 133–144)

## 2024-02-26 LAB — CROSSMATCH RED CELLS - UNITS
UNIT DIVISION: 0
UNIT DIVISION: 0
UNIT DIVISION: 0
UNIT DIVISION: 0
UNIT DIVISION: 0
UNIT DIVISION: 0
UNIT DIVISION: 0
UNIT DIVISION: 0
UNIT DIVISION: 0

## 2024-02-26 LAB — POC BLOOD GLUCOSE (RESULTS)
GLUCOSE, POC: 182 mg/dL — ABNORMAL HIGH (ref 70–100)
GLUCOSE, POC: 173 mg/dL — ABNORMAL HIGH (ref 70–100)
GLUCOSE, POC: 181 mg/dL — ABNORMAL HIGH (ref 70–100)
GLUCOSE, POC: 148 mg/dL — ABNORMAL HIGH (ref 70–100)

## 2024-02-26 LAB — CBC WITH DIFF
BASOPHIL #: <0.1 x10ˆ3/uL (ref <=0.20)
BASOPHIL %: 0.4 %
EOSINOPHIL #: 0.24 x10ˆ3/uL (ref <=0.50)
EOSINOPHIL %: 2.2 %
HCT: 27.7 % — ABNORMAL LOW (ref 38.9–52.0)
HGB: 9.2 g/dL — ABNORMAL LOW (ref 13.4–17.5)
IMMATURE GRANULOCYTE #: <0.1 x10ˆ3/uL (ref <0.10)
IMMATURE GRANULOCYTE %: 0.5 % (ref 0.0–1.0)
LYMPHOCYTE #: 2.29 x10ˆ3/uL (ref 1.00–4.80)
LYMPHOCYTE %: 21.2 %
MCH: 27.1 pg (ref 26.0–32.0)
MCHC: 33.2 g/dL (ref 31.0–35.5)
MCV: 81.5 fL (ref 78.0–100.0)
MONOCYTE #: 1.49 x10ˆ3/uL — ABNORMAL HIGH (ref 0.20–1.10)
MONOCYTE %: 13.8 %
MPV: 10.5 fL (ref 8.7–12.5)
NEUTROPHIL #: 6.67 x10ˆ3/uL (ref 1.50–7.70)
NEUTROPHIL %: 61.9 %
PLATELETS: 128 x10ˆ3/uL — ABNORMAL LOW (ref 150–400)
RBC: 3.4 x10ˆ6/uL — ABNORMAL LOW (ref 4.50–6.10)
RDW-CV: 15.7 % — ABNORMAL HIGH (ref 11.5–15.5)
WBC: 10.8 x10ˆ3/uL (ref 3.7–11.0)

## 2024-02-26 LAB — IONIZED CALCIUM WITH PH
IONIZED CALCIUM: 1.19 mmol/L (ref 1.15–1.33)
PH (VENOUS): 7.4 (ref 7.32–7.43)

## 2024-02-26 LAB — TYPE AND SCREEN
ABO/RH(D): B POS
ANTIBODY SCREEN: NEGATIVE
UNITS ORDERED: 1

## 2024-02-26 LAB — MAGNESIUM: MAGNESIUM: 3.2 mg/dL — ABNORMAL HIGH (ref 1.6–2.4)

## 2024-02-26 MED ORDER — BISACODYL 10 MG RECTAL SUPPOSITORY
10.0000 mg | Freq: Two times a day (BID) | RECTAL | Status: DC
Start: 2024-02-26 — End: 2024-02-28
  Administered 2024-02-26: 10 mg via RECTAL
  Administered 2024-02-27 – 2024-02-28 (×3): 0 mg via RECTAL
  Filled 2024-02-26: qty 1

## 2024-02-26 MED ORDER — METOPROLOL TARTRATE 25 MG TABLET
25.0000 mg | ORAL_TABLET | Freq: Two times a day (BID) | ORAL | Status: DC
Start: 2024-02-26 — End: 2024-02-28
  Administered 2024-02-26 – 2024-02-28 (×4): 25 mg via ORAL
  Filled 2024-02-26 (×4): qty 1

## 2024-02-26 MED ORDER — MAGNESIUM HYDROXIDE 400 MG/5 ML ORAL SUSPENSION
15.0000 mL | Freq: Every day | ORAL | Status: DC
Start: 2024-02-26 — End: 2024-02-28
  Administered 2024-02-26: 1200 mg via ORAL
  Administered 2024-02-27: 0 mg via ORAL
  Administered 2024-02-28: 1200 mg via ORAL
  Filled 2024-02-26 (×2): qty 30

## 2024-02-26 MED ORDER — NURSING DRIVEN ICU ELECTROLYTE REPLACEMENT PLACEHOLDER- NON RUBY
Freq: Every day | Status: DC | PRN
Start: 2024-02-26 — End: 2024-02-28

## 2024-02-26 NOTE — Progress Notes (Signed)
 [facility]  Progress Note    Jeremy Sexton  Date of service: 02/26/2024   Date of Admission:  02/23/2024    Hospital Day:  LOS: 3 days     Chief complaint: feels some better    Subjective: Jeremy Sexton is a 68 y.o., White male who presents with a known history of coronary artery disease underwent left heart catheterization by Dr. Neomi that revealed multivessel coronary disease.  He was referred to Dr. Skipper for coronary artery bypass graft.  He underwent procedure today CABG x 5 and was taken to the open heart recovery intubated on IV vasopressors.  We have been asked to see for critical care management.  He is currently intubated I will give review of systems or HPI     7/30- up to chair. Breathing is stable. Pulling on IS.     7/31: up to chair today. 2L NC - Cr. Bumped up, having urinary retention issues. Was given fluid bolus, for foley placement today.     8/1: 2L NC - feels better. AKI improved. Foley in place. Good output. To DC tubes and wires today.      ROS  Gen: No fever, aches, chills  HEENT: No headache ,nasal congestion, sore throat  Lungs: +mild SOB, cough  Heart: No chest pain, palpitations, syncope  Skin: No rashes, lesions, pruritus    Vital Signs:  Temp  Avg: 36.6 C (97.9 F)  Min: 36.4 C (97.6 F)  Max: 36.8 C (98.2 F)    Pulse  Avg: 83.3  Min: 76  Max: 92 BP  Min: 120/67  Max: 144/74   Resp  Avg: 14.1  Min: 11  Max: 20 SpO2  Avg: 93.5 %  Min: 90 %  Max: 97 %          Input/Output    Intake/Output Summary (Last 24 hours) at 02/26/2024 1044  Last data filed at 02/26/2024 0900  Gross per 24 hour   Intake 783.33 ml   Output 2270 ml   Net -1486.67 ml    I/O last shift:  08/01 0700 - 08/01 1859  In: 120 [P.O.:120]  Out: 235 [Urine:185; Chest Tube:50]   acetaminophen  (TYLENOL ) tablet, 650 mg, Oral, Q4H PRN  [Held by provider] amLODIPine  (NORVASC) tablet, 10 mg, Oral, QAM  ascorbic acid  (VITAMIN C ) tablet, 500 mg, Oral, 2x/day  aspirin  chewable tablet 81 mg, 81 mg, Oral,  Daily  atorvastatin  (LIPITOR) tablet, 40 mg, Oral, NIGHTLY  bisacodyl  (DULCOLAX) rectal suppository, 10 mg, Rectal, Daily PRN  buPROPion  (WELLBUTRIN ) tablet, 75 mg, Oral, 2x/day  [Held by provider] carvedilol  (COREG ) tablet, 50 mg, Oral, 2x/day-Food  chlorhexidine  gluconate (PERIDEX ) 0.12% mouthwash, 15 mL, Topical, 2x/day  [Held by provider] cloNIDine  (CATAPRES ) tablet, 0.1 mg, Oral, 2x/day  clopidogrel  (PLAVIX ) 75 mg tablet, 75 mg, Oral, Daily  Correction/SSIP insulin  lispro 100 units/mL injection, 2-9 Units, Subcutaneous, Q4H  dextrose  (GLUTOSE) 40% oral gel, 15 g, Oral, Q15 Min PRN  dextrose  50% (0.5 g/mL) injection - syringe, 12.5 g, Intravenous, Q15 Min PRN  docusate sodium  (COLACE) capsule, 100 mg, Oral, 2x/day  furosemide  (LASIX ) 10 mg/mL injection, 20 mg, Intravenous, 2x/day  gabapentin  (NEURONTIN ) capsule 400 mg, 400 mg, Oral, 3x/day  glucagon  (GLUCAGEN) injection 1 mg, 1 mg, IntraMUSCULAR, Once PRN  heparin  5,000 unit/mL injection, 5,000 Units, Subcutaneous, Q8HRS  HYDROmorphone  (DILAUDID ) 0.5 mg/0.5 mL injection, 0.2 mg, Intravenous, Q4H PRN  ipratropium (ATROVENT ) 0.02% nebulizer solution, 0.5 mg, Nebulization, Q4H PRN  [Held by provider]  lisinopril  (PRINIVIL ) tablet, 20 mg, Oral, QAM  magnesium  hydroxide (MILK OF MAGNESIA) 400mg  per 5mL oral liquid, 30 mL, Oral, Daily PRN  [Held by provider] metFORMIN  (GLUCOPHAGE ) tablet, 500 mg, Oral, 2x/day-Food  metoprolol  tartrate (LOPRESSOR ) tablet, 12.5 mg, Oral, Q12H  mupirocin  (BACTROBAN ) 2% topical ointment, , Apply Topically, 2x/day  NS flush syringe, 3 mL, Intracatheter, Q8HRS  NS flush syringe, 3 mL, Intracatheter, Q1H PRN  ondansetron  (ZOFRAN ) 2 mg/mL injection, 4 mg, Intravenous, Q8H PRN  oxyCODONE  (ROXICODONE ) immediate release tablet, 5 mg, Oral, Q4H PRN  oxyCODONE  (ROXICODONE ) immediate release tablet, 10 mg, Oral, Q4H PRN  potassium chloride  (K-DUR) extended release tablet, 10 mEq, Oral, 2x/day-Food  tamsulosin  (FLOMAX ) capsule, 0.4 mg, Oral, Daily  after Dinner  [Held by provider] traZODone  (DESYREL ) tablet, 50 mg, Oral, NIGHTLY        Physical Exam:  Constitutional: awake, alert, appears stated age. No acute distress noted.    Eyes: Pupils equal and round, reactive to light and accomodation. , Sclera non-icteric. , normal findings: lids and lashes normal and conjunctivae and sclerae normal  ENT: Head atraumatic and normocephalic, External Ears: normal pinnae shape and position and no signs of inflammation, Nose without erythema. , Mouth mucous membranes moist. , No oral lesions.   Neck: no thyromegaly or lymphadenopathy, supple, symmetrical, trachea midline, and no adenopathy.   Respiratory:   Clear with diminished bases, chest tube in place  Cardiovascular: regular rate and rhythm, S1, S2 normal.    Gastrointestinal: non-distended, Soft, non-tender, Bowel sounds normal, No hepatosplenomegaly, No masses.    Musculoskeletal:  moves all 4 extremities , no edema noted.   Integumentary:  Skin warm and dry and Skin color, texture, turgor normal. No rashes or lesions  Neurologic: Grossly normal.  Following commands, CN II - XII grossly intact , No tremor  Lymphatic/Immunologic/Hematologic: No lymphadenopathy  Psychiatric:  Pleasant affect.  Insight and judgment seem intact.    Labs:     Results for orders placed or performed during the hospital encounter of 02/23/24 (from the past 24 hours)   POC BLOOD GLUCOSE (RESULTS)   Result Value Ref Range    GLUCOSE, POC 159 (H) 70 - 100 mg/dl   POC BLOOD GLUCOSE (RESULTS)   Result Value Ref Range    GLUCOSE, POC 247 (H) 70 - 100 mg/dl   POC BLOOD GLUCOSE (RESULTS)   Result Value Ref Range    GLUCOSE, POC 196 (H) 70 - 100 mg/dl   POC BLOOD GLUCOSE (RESULTS)   Result Value Ref Range    GLUCOSE, POC 152 (H) 70 - 100 mg/dl   BASIC METABOLIC PANEL, NON-FASTING   Result Value Ref Range    SODIUM 136 133 - 144 mmol/L    POTASSIUM 4.4 3.2 - 5.0 mmol/L    CHLORIDE 103 96 - 106 mmol/L    CO2 TOTAL 23 22 - 30 mmol/L    ANION GAP 10 7 -  18 mmol/L    CALCIUM  8.7 8.3 - 10.7 mg/dL    GLUCOSE 874 (H) 74 - 109 mg/dL    BUN 30 (H) 8 - 23 mg/dL    CREATININE 8.97 9.29 - 1.20 mg/dL    BUN/CREA RATIO 29     ESTIMATED GFR 81 (L) >90 mL/min/1.27m^2   MAGNESIUM    Result Value Ref Range    MAGNESIUM  3.2 (H) 1.6 - 2.4 mg/dL   IONIZED CALCIUM    Result Value Ref Range    PH (VENOUS) 7.40 7.32 - 7.43    IONIZED CALCIUM   1.19 1.15 - 1.33 mmol/L   CBC WITH DIFF   Result Value Ref Range    WBC 10.8 3.7 - 11.0 x10^3/uL    RBC 3.40 (L) 4.50 - 6.10 x10^6/uL    HGB 9.2 (L) 13.4 - 17.5 g/dL    HCT 72.2 (L) 61.0 - 52.0 %    MCV 81.5 78.0 - 100.0 fL    MCH 27.1 26.0 - 32.0 pg    MCHC 33.2 31.0 - 35.5 g/dL    RDW-CV 84.2 (H) 88.4 - 15.5 %    PLATELETS 128 (L) 150 - 400 x10^3/uL    MPV 10.5 8.7 - 12.5 fL    NEUTROPHIL % 61.9 %    LYMPHOCYTE % 21.2 %    MONOCYTE % 13.8 %    EOSINOPHIL % 2.2 %    BASOPHIL % 0.4 %    NEUTROPHIL # 6.67 1.50 - 7.70 x10^3/uL    LYMPHOCYTE # 2.29 1.00 - 4.80 x10^3/uL    MONOCYTE # 1.49 (H) 0.20 - 1.10 x10^3/uL    EOSINOPHIL # 0.24 <=0.50 x10^3/uL    BASOPHIL # <0.10 <=0.20 x10^3/uL    IMMATURE GRANULOCYTE % 0.5 0.0 - 1.0 %    IMMATURE GRANULOCYTE # <0.10 <0.10 x10^3/uL   POC BLOOD GLUCOSE (RESULTS)   Result Value Ref Range    GLUCOSE, POC 182 (H) 70 - 100 mg/dl      Radiology:      Results for orders placed or performed during the hospital encounter of 02/23/24 (from the past 2160 hours)   XR CHEST AP MOBILE     Status: None    Narrative    Deontra W Macmullen    XR AP MOBILE CHEST  02/23/2024 1:10 PM    INDICATION:  Immediate Post-Op Exam - evaluation for effusion  evaluation for effusion    TECHNIQUE:  Portable frontal view obtained on 1 image(s).    COMPARISON:  None available.    FINDINGS:    Impression    See impression:      IMPRESSION:    CABG. ET and NG tubes in satisfactory position.   Tubes or drains project over the left mid chest and upper mid abdomen.  Right IJ central venous catheter sheath.      Heart size is normal.  No focal consolidation or  effusion.  No pneumothorax or evidence of pneumomediastinum.        Radiologist location ID: [id number]     XR AP MOBILE CHEST     Status: None    Narrative    Ilia W Morgenthaler    XR AP MOBILE CHEST performed on 02/24/2024 4:38 AM.    INDICATION:  Status-Post Cardiothoracic Surgery - evaluation for effusion   Additional History:  follow up    TECHNIQUE:  Single portable frontal view of the chest.  1 views/1 images submitted for interpretation.    COMPARISON:  Yesterday    ___________________________________  FINDINGS:     * Interval extubation and NG tube removal. Similar right central line. Persistent but altered position of the subxiphoid catheters.  * No pneumothorax.  * Similar cardiomediastinal silhouette and aeration.    ___________________________________    Impression    No acute cardiopulmonary findings. Discontinued ETT and NGT. Altered subxiphoid catheter position.         Radiologist location ID: [id number]     XR KUB     Status: None    Narrative    Waseem W Pardy    XR KUB  performed on 02/24/2024 11:55 AM.    INDICATION:  evaluate bowel r/o ileus   Additional History:  adominal distension, evaluate bowel r/o ileus    TECHNIQUE:  Single supine KUB view of the abdomen.  1 views/1 images submitted for interpretation.    COMPARISON:  None available.  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    Nonspecific bowel gas pattern. No renal calcifications.  ___________________________________    Impression    Nonspecific bowel gas pattern.      Radiologist location ID: [id number]     XR AP MOBILE CHEST     Status: None    Narrative    Penny W Keener    XR AP MOBILE CHEST performed on 02/25/2024 4:55 AM.    INDICATION:  Status-Post Cardiothoracic Surgery - evaluation for effusion   Additional History:  Status-Post Cardiothoracic Surgery - evaluation for effusion    TECHNIQUE:  Single portable frontal view of the chest.  1 views/1 images  submitted for interpretation.    COMPARISON:  Yesterday    ___________________________________  FINDINGS:     * Similar support devices.  * Similar cardiomediastinal silhouette.   * No pneumothorax or large pleural effusion.   * Similar streaky perihilar and right basal atelectasis. No pulmonary edema or new consolidation.    ___________________________________    Impression    No significant interval change.           Radiologist location ID: [id number]     XR AP MOBILE CHEST     Status: None    Narrative    Jakorian W Danker    XR AP MOBILE CHEST performed on 02/26/2024 5:08 AM.    INDICATION:  Status-Post Cardiothoracic Surgery - evaluation for effusion   Additional History:  follow up    TECHNIQUE:  Single portable frontal view of the chest.  1 views/1 images submitted for interpretation.    COMPARISON:  02/25/2024.    ___________________________________  FINDINGS:     * Discontinued right central line. Persistent subxiphoid catheters.  * Similar cardiomediastinal silhouette.   * No pneumothorax or large pleural effusion.   * Similar atelectasis and mild pulmonary vascular congestion.    ___________________________________    Impression    Discontinued right central line. Similar aeration.         Radiologist location ID: [id number]        Consults:   Assessment/ Plan:   Active Hospital Problems    Diagnosis    Primary Problem: S/P CABG (coronary artery bypass graft)    Nonhealing nonsurgical wound with fat layer exposed    Cardiac insufficiency following cardiac surgery    Lactic acidosis    Hypovolemic shock (CMS HCC)     Patient seen and examined  Chart reviewed  Tele shows sinus  CXR reviewed, mild atelectasis  67y/o s/p CABG with h/o DM and PVD. Lifelong non smoker. No h/o lung disease.  Up to chair and using IS  Plan to DC tubes and wires today  Foley placed yesterday for AKI/retention  Improved today  Mobilize as tol.   Wean O2 as able for SpO2 >/=92%  Will discuss with my attending      Larraine LITTIE Marc,  APRN-FNP-BC,      Pulmonary attending:  Patient seen and examined personally by me  Pertinent labs/images/reports were reviewed personally by me  Chart records and history reviewed  Assessment and plan of care reviewed in detail  I performed majority  of Medical Decision Making of this visit on this date of service acounting for greater than 50% of the medical visit.  Patient has acute and  chronic illness requiring review of testing, ordering of follow-up testing, and interpretation of test results.      Encourage use of incentive spirometer   Encouraged continued ambulation  Plans for discontinuation of chest tubes and wires today   Discussed with the bedside nurse   Follow up labs   Replace electrolytes   Oxygen as needed maintain saturation 90% or greater   Chest x-ray personally viewed by me with some minimal atelectasis, we will continue work with pulmonary hygiene    Bernardino LITTIE Hoyle, DO  02/26/2024 13:09

## 2024-02-26 NOTE — Care Plan (Signed)
 Problem: Fall Injury Risk  Goal: Absence of Fall and Fall-Related Injury  Outcome: Ongoing (see interventions/notes)  Intervention: Identify and Manage Contributors  Recent Flowsheet Documentation  Taken 02/26/2024 1900 by Geryl PARAS, RN  Self-Care Promotion: independence encouraged  Medication Review/Management: medications reviewed  Intervention: Promote Injury-Free Environment  Recent Flowsheet Documentation  Taken 02/26/2024 2000 by Geryl PARAS, RN  Safety Promotion/Fall Prevention:   activity supervised   fall prevention program maintained   muscle strengthening facilitated   nonskid shoes/slippers when out of bed   safety round/check completed  Taken 02/26/2024 1900 by Geryl PARAS, RN  Safety Promotion/Fall Prevention: safety round/check completed     Problem: Health Knowledge, Opportunity to Enhance (Adult,Obstetrics,Pediatric)  Goal: Knowledgeable about Health Subject/Topic  Description: Patient will demonstrate the desired outcomes by discharge/transition of care.  Outcome: Ongoing (see interventions/notes)  Intervention: Enhance Health Knowledge  Recent Flowsheet Documentation  Taken 02/26/2024 1900 by Geryl PARAS, RN  Supportive Measures: active listening utilized     Problem: Infection  Goal: Absence of Infection Signs and Symptoms  Outcome: Ongoing (see interventions/notes)  Intervention: Prevent or Manage Infection  Recent Flowsheet Documentation  Taken 02/26/2024 1900 by Geryl PARAS, RN  Fever Reduction/Comfort Measures:   lightweight bedding   lightweight clothing  Infection Management: aseptic technique maintained     Problem: Pain Acute  Goal: Optimal Pain Control and Function  Outcome: Ongoing (see interventions/notes)  Intervention: Optimize Psychosocial Wellbeing  Recent Flowsheet Documentation  Taken 02/26/2024 1900 by Geryl PARAS, RN  Supportive Measures: active listening utilized  Intervention: Prevent or Manage Pain  Recent Flowsheet Documentation  Taken 02/26/2024 1900 by Geryl PARAS, RN  Sensory Stimulation Regulation:    auditory stimulation minimized   care clustered   lighting decreased   quiet environment promoted  Sleep/Rest Enhancement:   awakenings minimized   consistent schedule promoted   family presence promoted   natural light exposure provided  Bowel Elimination Promotion:   adequate fluid intake promoted   ambulation promoted  Medication Review/Management: medications reviewed

## 2024-02-26 NOTE — Care Plan (Signed)
 Problem: Wound  Goal: Optimal Coping  Intervention: Support Patient and Family Response  Recent Flowsheet Documentation  Taken 02/26/2024 0700 by Suzen SQUIBB, RN  Supportive Measures:   active listening utilized   counseling provided   decision-making supported   verbalization of feelings encouraged   self-responsibility promoted   self-reflection promoted   self-care encouraged   relaxation techniques promoted   problem-solving facilitated   positive reinforcement provided  Goal: Optimal Functional Ability  Intervention: Optimize Functional Ability  Recent Flowsheet Documentation  Taken 02/26/2024 0700 by Suzen SQUIBB, RN  Activity Management:   ambulated in room   returned to bed  Activity Assistance Provided: assistance, 1 person  Taken 02/25/2024 1800 by Suzen SQUIBB, RN  Activity Management: ambulated in hall  Activity Assistance Provided: assistance, 2 people  Assistive Device Utilized: walker  Goal: Absence of Infection Signs and Symptoms  Intervention: Prevent or Manage Infection  Recent Flowsheet Documentation  Taken 02/26/2024 0700 by Suzen SQUIBB, RN  Fever Reduction/Comfort Measures:   lightweight bedding   lightweight clothing   room temperature adjusted  Infection Management: aseptic technique maintained  Goal: Optimal Pain Control and Function  Intervention: Prevent or Manage Pain  Recent Flowsheet Documentation  Taken 02/26/2024 0700 by Suzen SQUIBB, RN  Sleep/Rest Enhancement:   awakenings minimized   natural light exposure provided   noise level reduced   relaxation techniques promoted   therapeutic touch utilized  Goal: Skin Health and Integrity  Intervention: Optimize Skin Protection  Recent Flowsheet Documentation  Taken 02/26/2024 0700 by Suzen SQUIBB, RN  Pressure Reduction Techniques:   Heels elevated off of the bed   Patient turned q 2 hours   Moisture, shear and nutrition are maximized   Supplemented with small shifts   Frequent weight shifting encouraged   Pressure points protected  Pressure Reduction  Devices:   Heel offloading device utilized   Pressure redistributing mattress utilized   Specialty bed/mattress utilized   Repositioning wedges/pillows utilized  Activity Management:   ambulated in room   returned to bed  Taken 02/25/2024 1800 by Suzen SQUIBB, RN  Activity Management: ambulated in hall  Goal: Optimal Wound Healing  Intervention: Promote Wound Healing  Recent Flowsheet Documentation  Taken 02/26/2024 0700 by Suzen SQUIBB, RN  Pressure Reduction Techniques:   Heels elevated off of the bed   Patient turned q 2 hours   Moisture, shear and nutrition are maximized   Supplemented with small shifts   Frequent weight shifting encouraged   Pressure points protected  Pressure Reduction Devices:   Heel offloading device utilized   Pressure redistributing mattress utilized   Specialty bed/mattress utilized   Repositioning wedges/pillows utilized  Sleep/Rest Enhancement:   awakenings minimized   natural light exposure provided   noise level reduced   relaxation techniques promoted   therapeutic touch utilized  Activity Management:   ambulated in room   returned to bed  Taken 02/25/2024 1800 by Suzen SQUIBB, RN  Activity Management: ambulated in hall

## 2024-02-26 NOTE — Nurses Notes (Signed)
 Robertha Rack, PA-C at bedside. Advised of pts status. Advised that around 0530 pt went into Afib for 2-13mins. Has been normal sinus since then. No new orders at this time.

## 2024-02-26 NOTE — Nurses Notes (Signed)
 02/26/24 0615   Daily Care   Activity Management ambulated in room;up in chair   Activity Assistance Provided assistance, 1 person   Assistive Device Utilized walker   Ambulation Distance (Feet) 15     Pts chest tube dressings changed, electrodes changed. Pt ambulated from bed to chair. Pts call light, and personal belongings within reach.

## 2024-02-26 NOTE — Procedures (Signed)
 Temporary Epicardial Pacing Wire Removal Note    Patient's Name: Jeremy Sexton  Patient's DOB: [birth year]  Patient's room#: 25/A    Patient has epicardial wires s/p CABG. Heart rhythm has been stable. Wires removed after discussion with Dr. Skipper. Removal technique was rehearsed with the patient prior to removal. The suture was cut and the pacing wires were removed in a smooth motion with no resistance. Patient tolerated the procedure well. Pacing wire precautions per protocol.    [place], PA-C

## 2024-02-26 NOTE — Care Management Notes (Signed)
 This CM met with patient and his son at bedside to discuss discharge planning options.  Patient request a home health referral be made to [facility] and [facility].  Referral made in [facility].  Upon discharge please call report to [phone].  Patient request a FWW and BSC upon discharge. Please consider ordering FWW and BSC to assist with discharge planning.

## 2024-02-26 NOTE — Care Plan (Signed)
 Problem: Wound  Goal: Optimal Coping  Outcome: Outcome Achieved  Intervention: Support Patient and Family Response  Recent Flowsheet Documentation  Taken 02/25/2024 2000 by Rosina SAUNDERS, RN  Supportive Measures: active listening utilized  Goal: Optimal Functional Ability  Outcome: Outcome Achieved  Intervention: Optimize Functional Ability  Recent Flowsheet Documentation  Taken 02/26/2024 0615 by Rosina SAUNDERS, RN  Activity Management:   ambulated in room   up in chair  Activity Assistance Provided: assistance, 1 person  Assistive Device Utilized: walker  Taken 02/26/2024 0400 by Rosina SAUNDERS, RN  Activity Management: ROM, active encouraged  Taken 02/26/2024 0000 by Rosina SAUNDERS, RN  Activity Management: ROM, active encouraged  Taken 02/25/2024 2110 by Rosina SAUNDERS, RN  Activity Management:   ROM, active encouraged   returned to bed  Activity Assistance Provided: assistance, 1 person  Taken 02/25/2024 2000 by Rosina SAUNDERS, RN  Activity Management:   up in chair   ROM, active encouraged  Goal: Absence of Infection Signs and Symptoms  Outcome: Outcome Achieved  Intervention: Prevent or Manage Infection  Recent Flowsheet Documentation  Taken 02/26/2024 0400 by Rosina SAUNDERS, RN  Infection Management: aseptic technique maintained  Taken 02/26/2024 0000 by Rosina SAUNDERS, RN  Infection Management: aseptic technique maintained  Taken 02/25/2024 2000 by Rosina SAUNDERS, RN  Fever Reduction/Comfort Measures: lightweight bedding  Infection Management: aseptic technique maintained  Goal: Improved Oral Intake  Outcome: Outcome Achieved  Goal: Optimal Pain Control and Function  Outcome: Outcome Achieved  Intervention: Prevent or Manage Pain  Recent Flowsheet Documentation  Taken 02/25/2024 2000 by Rosina SAUNDERS, RN  Sleep/Rest Enhancement:   awakenings minimized   noise level reduced   regular sleep/rest pattern promoted   room darkened  Goal: Skin Health and Integrity  Outcome: Outcome Achieved  Intervention: Optimize Skin Protection  Recent Flowsheet Documentation  Taken 02/26/2024 0615  by Rosina SAUNDERS, RN  Activity Management:   ambulated in room   up in chair  Taken 02/26/2024 0400 by Rosina SAUNDERS, RN  Pressure Reduction Techniques:   Heels elevated off of the bed   Mobility is maximized   Moisture, shear and nutrition are maximized   Supplemented with small shifts   Frequent weight shifting encouraged  Pressure Reduction Devices: Pressure reduction chair cushion utilized  Activity Management: ROM, active encouraged  Head of Bed (HOB) Positioning: HOB at 30-45 degrees  Taken 02/26/2024 0000 by Rosina SAUNDERS, RN  Pressure Reduction Techniques:   Heels elevated off of the bed   Mobility is maximized   Moisture, shear and nutrition are maximized   Supplemented with small shifts   Frequent weight shifting encouraged  Pressure Reduction Devices: Pressure reduction chair cushion utilized  Activity Management: ROM, active encouraged  Taken 02/25/2024 2110 by Rosina SAUNDERS, RN  Activity Management:   ROM, active encouraged   returned to bed  Taken 02/25/2024 2000 by Rosina SAUNDERS, RN  Pressure Reduction Techniques:   Heels elevated off of the bed   Mobility is maximized   Moisture, shear and nutrition are maximized   Supplemented with small shifts   Frequent weight shifting encouraged  Pressure Reduction Devices: Pressure reduction chair cushion utilized  Activity Management:   up in chair   ROM, active encouraged  Head of Bed (HOB) Positioning: HOB at 30-45 degrees  Goal: Optimal Wound Healing  Outcome: Outcome Achieved  Intervention: Promote Wound Healing  Recent Flowsheet Documentation  Taken 02/26/2024 0615 by Rosina SAUNDERS, RN  Activity Management:   ambulated in room   up in  chair  Taken 02/26/2024 0400 by Rosina SAUNDERS, RN  Pressure Reduction Techniques:   Heels elevated off of the bed   Mobility is maximized   Moisture, shear and nutrition are maximized   Supplemented with small shifts   Frequent weight shifting encouraged  Pressure Reduction Devices: Pressure reduction chair cushion utilized  Activity Management: ROM, active  encouraged  Taken 02/26/2024 0000 by Rosina SAUNDERS, RN  Pressure Reduction Techniques:   Heels elevated off of the bed   Mobility is maximized   Moisture, shear and nutrition are maximized   Supplemented with small shifts   Frequent weight shifting encouraged  Pressure Reduction Devices: Pressure reduction chair cushion utilized  Activity Management: ROM, active encouraged  Taken 02/25/2024 2110 by Rosina SAUNDERS, RN  Activity Management:   ROM, active encouraged   returned to bed  Taken 02/25/2024 2000 by Rosina SAUNDERS, RN  Pressure Reduction Techniques:   Heels elevated off of the bed   Mobility is maximized   Moisture, shear and nutrition are maximized   Supplemented with small shifts   Frequent weight shifting encouraged  Pressure Reduction Devices: Pressure reduction chair cushion utilized  Sleep/Rest Enhancement:   awakenings minimized   noise level reduced   regular sleep/rest pattern promoted   room darkened  Activity Management:   up in chair   ROM, active encouraged     Problem: Mechanical Ventilation Invasive  Goal: Effective Communication  Outcome: Outcome Achieved  Intervention: Ensure Effective Communication  Recent Flowsheet Documentation  Taken 02/26/2024 0400 by Rosina SAUNDERS, RN  Trust Relationship/Rapport:   care explained   questions answered  Taken 02/26/2024 0000 by Rosina SAUNDERS, RN  Trust Relationship/Rapport:   care explained   questions answered  Taken 02/25/2024 2000 by Rosina SAUNDERS, RN  Trust Relationship/Rapport:   care explained   questions answered  Goal: Optimal Device Function  Outcome: Outcome Achieved  Intervention: Optimize Device Care and Function  Recent Flowsheet Documentation  Taken 02/26/2024 0400 by Rosina SAUNDERS, RN  Airway Safety Measures:   mask valve resuscitator at bedside   oxygen flowmeter at bedside   suction at bedside  Taken 02/26/2024 0000 by Rosina SAUNDERS, RN  Airway Safety Measures:   mask valve resuscitator at bedside   oxygen flowmeter at bedside   suction at bedside  Taken 02/25/2024 2000 by Rosina SAUNDERS, RN  Airway  Safety Measures:   mask valve resuscitator at bedside   oxygen flowmeter at bedside   suction at bedside  Airway/Ventilation Management: airway patency maintained  Goal: Mechanical Ventilation Liberation  Outcome: Outcome Achieved  Intervention: Promote Extubation and Mechanical Ventilation Liberation  Recent Flowsheet Documentation  Taken 02/25/2024 2000 by Rosina SAUNDERS, RN  Sleep/Rest Enhancement:   awakenings minimized   noise level reduced   regular sleep/rest pattern promoted   room darkened  Medication Review/Management:   medications reviewed   high-risk medications identified  Environmental Support: calm environment promoted  Goal: Optimal Nutrition Delivery  Outcome: Outcome Achieved  Goal: Absence of Device-Related Skin and Tissue Injury  Outcome: Outcome Achieved  Intervention: Maintain Skin and Tissue Health  Recent Flowsheet Documentation  Taken 02/26/2024 0400 by Rosina SAUNDERS, RN  Device Skin Pressure Protection:   absorbent pad utilized/changed   adhesive use limited   tubing/devices free from skin contact  Taken 02/26/2024 0000 by Rosina SAUNDERS, RN  Device Skin Pressure Protection:   absorbent pad utilized/changed   adhesive use limited   tubing/devices free from skin contact  Taken 02/25/2024 2000 by Rosina SAUNDERS,  RN  Device Skin Pressure Protection:   absorbent pad utilized/changed   adhesive use limited   tubing/devices free from skin contact  Goal: Absence of Ventilator-Induced Lung Injury  Outcome: Outcome Achieved  Intervention: Facilitate Lung-Protection Measures  Recent Flowsheet Documentation  Taken 02/25/2024 2000 by Rosina SAUNDERS, RN  Lung Protection Measures: lung compliance monitored  Intervention: Prevent Ventilator-Associated Pneumonia  Recent Flowsheet Documentation  Taken 02/26/2024 0400 by Rosina SAUNDERS, RN  VAP Prevention Bundle:   HOB elevation maintained   oral care with chlorhexidine   Head of Bed (HOB) Positioning: HOB at 30-45 degrees  VAP Prevention Measures: completed  Taken 02/26/2024 0000 by Rosina SAUNDERS, RN  VAP  Prevention Bundle:   HOB elevation maintained   oral care with chlorhexidine   VAP Prevention Measures: completed  Taken 02/25/2024 2000 by Rosina SAUNDERS, RN  VAP Prevention Bundle:   HOB elevation maintained   oral care with chlorhexidine   Head of Bed (HOB) Positioning: HOB at 30-45 degrees  VAP Prevention Measures: completed     Problem: Adult Inpatient Plan of Care  Goal: Plan of Care Review  Outcome: Outcome Achieved  Goal: Patient-Specific Goal (Individualized)  Outcome: Outcome Achieved  Flowsheets (Taken 02/25/2024 2000)  Individualized Care Needs: Monitor chest tube and output, monitor urine output, monitor vital signs, increase mobility  Anxieties, Fears or Concerns: None voiced at this time  Goal: Absence of Hospital-Acquired Illness or Injury  Outcome: Outcome Achieved  Intervention: Identify and Manage Fall Risk  Recent Flowsheet Documentation  Taken 02/26/2024 0600 by Rosina SAUNDERS, RN  Safety Promotion/Fall Prevention:   activity supervised   fall prevention program maintained   nonskid shoes/slippers when out of bed   safety round/check completed  Taken 02/26/2024 0400 by Rosina SAUNDERS, RN  Safety Promotion/Fall Prevention:   activity supervised   fall prevention program maintained   nonskid shoes/slippers when out of bed   safety round/check completed  Taken 02/26/2024 0200 by Rosina SAUNDERS, RN  Safety Promotion/Fall Prevention:   activity supervised   fall prevention program maintained   nonskid shoes/slippers when out of bed   safety round/check completed  Taken 02/26/2024 0000 by Rosina SAUNDERS, RN  Safety Promotion/Fall Prevention:   activity supervised   fall prevention program maintained   nonskid shoes/slippers when out of bed   safety round/check completed  Taken 02/25/2024 2200 by Rosina SAUNDERS, RN  Safety Promotion/Fall Prevention:   activity supervised   fall prevention program maintained   nonskid shoes/slippers when out of bed   safety round/check completed  Taken 02/25/2024 2000 by Rosina SAUNDERS, RN  Safety Promotion/Fall  Prevention:   activity supervised   fall prevention program maintained   nonskid shoes/slippers when out of bed   safety round/check completed  Intervention: Prevent Skin Injury  Recent Flowsheet Documentation  Taken 02/26/2024 0600 by Rosina SAUNDERS, RN  Body Position: sitting  Taken 02/26/2024 0400 by Rosina SAUNDERS, RN  Body Position: side lying, right  Skin Protection:   adhesive use limited   tubing/devices free from skin contact  Taken 02/26/2024 0200 by Rosina SAUNDERS, RN  Body Position: supine  Taken 02/26/2024 0000 by Rosina SAUNDERS, RN  Body Position: side lying, left  Skin Protection:   adhesive use limited   tubing/devices free from skin contact  Taken 02/25/2024 2200 by Rosina SAUNDERS, RN  Body Position: supine  Taken 02/25/2024 2000 by Rosina SAUNDERS, RN  Body Position: sitting  Skin Protection:   adhesive use limited   tubing/devices free from skin contact  Intervention: Prevent  and Manage VTE (Venous Thromboembolism) Risk  Recent Flowsheet Documentation  Taken 02/26/2024 0400 by Rosina SAUNDERS, RN  Sequential Compression Device (SCDs): Sequential compression device on  VTE Prevention/Management:   ambulation promoted   anticoagulant therapy maintained   dorsiflexion/plantar flexion performed   bleeding risk factor(s) identified, physician notified  Taken 02/26/2024 0000 by Rosina SAUNDERS, RN  Sequential Compression Device (SCDs): Sequential compression device on  VTE Prevention/Management:   ambulation promoted   anticoagulant therapy maintained   dorsiflexion/plantar flexion performed   bleeding risk factor(s) identified, physician notified  Taken 02/25/2024 2000 by Rosina SAUNDERS, RN  Sequential Compression Device (SCDs): Sequential compression device on  VTE Prevention/Management:   ambulation promoted   anticoagulant therapy maintained   dorsiflexion/plantar flexion performed   bleeding risk factor(s) identified, physician notified  Intervention: Prevent Infection  Recent Flowsheet Documentation  Taken 02/25/2024 2000 by Rosina SAUNDERS, RN  Infection Prevention:    barrier precautions utilized   rest/sleep promoted   visitors restricted/screened   single patient room provided  Goal: Optimal Comfort and Wellbeing  Outcome: Outcome Achieved  Intervention: Provide Person-Centered Care  Recent Flowsheet Documentation  Taken 02/26/2024 0400 by Rosina SAUNDERS, RN  Trust Relationship/Rapport:   care explained   questions answered  Taken 02/26/2024 0000 by Rosina SAUNDERS, RN  Trust Relationship/Rapport:   care explained   questions answered  Taken 02/25/2024 2000 by Rosina SAUNDERS, RN  Trust Relationship/Rapport:   care explained   questions answered  Goal: Rounds/Family Conference  Outcome: Outcome Achieved     Problem: Skin Injury Risk Increased  Goal: Skin Health and Integrity  Outcome: Outcome Achieved  Intervention: Optimize Skin Protection  Recent Flowsheet Documentation  Taken 02/26/2024 0615 by Rosina SAUNDERS, RN  Activity Management:   ambulated in room   up in chair  Taken 02/26/2024 0400 by Rosina SAUNDERS, RN  Pressure Reduction Techniques:   Heels elevated off of the bed   Mobility is maximized   Moisture, shear and nutrition are maximized   Supplemented with small shifts   Frequent weight shifting encouraged  Pressure Reduction Devices: Pressure reduction chair cushion utilized  Skin Protection:   adhesive use limited   tubing/devices free from skin contact  Activity Management: ROM, active encouraged  Head of Bed (HOB) Positioning: HOB at 30-45 degrees  Taken 02/26/2024 0000 by Rosina SAUNDERS, RN  Pressure Reduction Techniques:   Heels elevated off of the bed   Mobility is maximized   Moisture, shear and nutrition are maximized   Supplemented with small shifts   Frequent weight shifting encouraged  Pressure Reduction Devices: Pressure reduction chair cushion utilized  Skin Protection:   adhesive use limited   tubing/devices free from skin contact  Activity Management: ROM, active encouraged  Taken 02/25/2024 2110 by Rosina SAUNDERS, RN  Activity Management:   ROM, active encouraged   returned to bed  Taken 02/25/2024 2000 by  Rosina SAUNDERS, RN  Pressure Reduction Techniques:   Heels elevated off of the bed   Mobility is maximized   Moisture, shear and nutrition are maximized   Supplemented with small shifts   Frequent weight shifting encouraged  Pressure Reduction Devices: Pressure reduction chair cushion utilized  Skin Protection:   adhesive use limited   tubing/devices free from skin contact  Activity Management:   up in chair   ROM, active encouraged  Head of Bed (HOB) Positioning: HOB at 30-45 degrees     Problem: Cardiovascular Surgery  Goal: Improved Activity Tolerance  Outcome: Outcome Achieved  Intervention:  Optimize Tolerance for Activity  Recent Flowsheet Documentation  Taken 02/25/2024 2000 by Rosina SAUNDERS, RN  Self-Care Promotion: independence encouraged  Environmental Support: calm environment promoted  Goal: Optimal Coping with Heart Surgery  Outcome: Outcome Achieved  Intervention: Support Psychosocial Response to Surgery  Recent Flowsheet Documentation  Taken 02/25/2024 2000 by Rosina SAUNDERS, RN  Supportive Measures: active listening utilized  Goal: Absence of Bleeding  Outcome: Outcome Achieved  Intervention: Monitor and Manage Bleeding  Recent Flowsheet Documentation  Taken 02/26/2024 0400 by Rosina SAUNDERS, RN  Bleeding Management: dressing monitored  Taken 02/26/2024 0000 by Rosina SAUNDERS, RN  Bleeding Management: dressing monitored  Taken 02/25/2024 2000 by Rosina SAUNDERS, RN  Bleeding Management: dressing monitored  Goal: Effective Bowel Elimination  Outcome: Outcome Achieved  Intervention: Enhance Bowel Motility and Elimination  Recent Flowsheet Documentation  Taken 02/26/2024 0400 by Rosina SAUNDERS, RN  Bowel Elimination Management: toileting offered  Taken 02/26/2024 0000 by Rosina SAUNDERS, RN  Bowel Elimination Management: toileting offered  Taken 02/25/2024 2000 by Rosina SAUNDERS, RN  Bowel Elimination Management: toileting offered  Bowel Motility Enhancement:   ambulation promoted   fluid intake encouraged  Goal: Effective Cardiac Function  Outcome: Outcome  Achieved  Intervention: Optimize Cardiac Output and Blood Flow  Recent Flowsheet Documentation  Taken 02/25/2024 2000 by Rosina SAUNDERS, RN  Dysrhythmia Management: pacing wires maintained  Goal: Optimal Cerebral Tissue Perfusion  Outcome: Outcome Achieved  Intervention: Protect and Optimize Cerebral Perfusion  Recent Flowsheet Documentation  Taken 02/26/2024 0400 by Rosina SAUNDERS, RN  Head of Bed [facility]) Positioning: HOB at 30-45 degrees  Taken 02/25/2024 2000 by Rosina SAUNDERS, RN  Fever Reduction/Comfort Measures: lightweight bedding  Glycemic Management: blood glucose monitored  Cerebral Perfusion Promotion: blood pressure monitored  Head of Bed (HOB) Positioning: HOB at 30-45 degrees  Goal: Fluid and Electrolyte Balance  Outcome: Outcome Achieved  Goal: Blood Glucose Level Within Target Range  Outcome: Outcome Achieved  Intervention: Optimize Glycemic Control  Recent Flowsheet Documentation  Taken 02/25/2024 2000 by Rosina SAUNDERS, RN  Glycemic Management: blood glucose monitored  Goal: Absence of Infection Signs and Symptoms  Outcome: Outcome Achieved  Intervention: Prevent or Manage Infection  Recent Flowsheet Documentation  Taken 02/25/2024 2000 by Rosina SAUNDERS, RN  Fever Reduction/Comfort Measures: lightweight bedding  Infection Prevention:   barrier precautions utilized   rest/sleep promoted   visitors restricted/screened   single patient room provided  Goal: Anesthesia/Sedation Recovery  Outcome: Outcome Achieved  Intervention: Optimize Anesthesia Recovery  Recent Flowsheet Documentation  Taken 02/26/2024 0600 by Rosina SAUNDERS, RN  $ Level Of Assistance: Independent  Treatment Status: Given  Safety Promotion/Fall Prevention:   activity supervised   fall prevention program maintained   nonskid shoes/slippers when out of bed   safety round/check completed  Level Incentive Spirometer (mL): 1250  Taken 02/26/2024 0400 by Rosina SAUNDERS, RN  Safety Promotion/Fall Prevention:   activity supervised   fall prevention program maintained   nonskid shoes/slippers  when out of bed   safety round/check completed  Taken 02/26/2024 0200 by Rosina SAUNDERS, RN  Safety Promotion/Fall Prevention:   activity supervised   fall prevention program maintained   nonskid shoes/slippers when out of bed   safety round/check completed  Taken 02/26/2024 0000 by Rosina SAUNDERS, RN  Safety Promotion/Fall Prevention:   activity supervised   fall prevention program maintained   nonskid shoes/slippers when out of bed   safety round/check completed  Taken 02/25/2024 2200 by Rosina SAUNDERS, RN  $ Level Of Assistance: Independent  Treatment Status: Given  Safety Promotion/Fall Prevention:   activity supervised   fall prevention program maintained   nonskid shoes/slippers when out of bed   safety round/check completed  Level Incentive Spirometer (mL): 1250  Taken 02/25/2024 2100 by Rosina SAUNDERS, RN  $ Level Of Assistance: Independent  Treatment Status: Given  Level Incentive Spirometer (mL): 1250  Taken 02/25/2024 2000 by Rosina SAUNDERS, RN  $ Level Of Assistance: Independent  Treatment Status: Given  Start Time: 2000  Stop Time: 2010  Duration (minutes): 10 minutes  Safety Promotion/Fall Prevention:   activity supervised   fall prevention program maintained   nonskid shoes/slippers when out of bed   safety round/check completed  Administration (IS):   other (see comments)   proper technique demonstrated  Level Incentive Spirometer (mL): 1250  Incentive Spirometer Predicted Level (mL): 750  Number of Repetitions (IS): 10  Patient Tolerance (IS): fair  Taken 02/25/2024 1900 by Rosina SAUNDERS, RN  $ Level Of Assistance: Independent  Treatment Status: Given  Level Incentive Spirometer (mL): 1250  Goal: Acceptable Pain Control  Outcome: Outcome Achieved  Goal: Nausea and Vomiting Relief  Outcome: Outcome Achieved  Goal: Effective Urinary Elimination  Outcome: Outcome Achieved  Intervention: Monitor and Manage Urinary Retention  Recent Flowsheet Documentation  Taken 02/25/2024 2000 by Rosina SAUNDERS, RN  Urinary Elimination Promotion: catheter patency  maintained  Goal: Effective Oxygenation and Ventilation  Outcome: Outcome Achieved  Intervention: Promote Airway Secretion Clearance  Recent Flowsheet Documentation  Taken 02/26/2024 0600 by Rosina SAUNDERS, RN  $ Level Of Assistance: Independent  Treatment Status: Given  Level Incentive Spirometer (mL): 1250  Taken 02/25/2024 2200 by Rosina SAUNDERS, RN  $ Level Of Assistance: Independent  Treatment Status: Given  Level Incentive Spirometer (mL): 1250  Taken 02/25/2024 2100 by Rosina SAUNDERS, RN  $ Level Of Assistance: Independent  Treatment Status: Given  Level Incentive Spirometer (mL): 1250  Taken 02/25/2024 2000 by Rosina SAUNDERS, RN  $ Level Of Assistance: Independent  Treatment Status: Given  Start Time: 2000  Stop Time: 2010  Duration (minutes): 10 minutes  Administration (IS):   other (see comments)   proper technique demonstrated  Airway/Ventilation Management: airway patency maintained  Level Incentive Spirometer (mL): 1250  Incentive Spirometer Predicted Level (mL): 750  Number of Repetitions (IS): 10  Patient Tolerance (IS): fair  Taken 02/25/2024 1900 by Rosina SAUNDERS, RN  $ Level Of Assistance: Independent  Treatment Status: Given  Level Incentive Spirometer (mL): 1250  Intervention: Optimize Oxygenation and Ventilation  Recent Flowsheet Documentation  Taken 02/25/2024 2000 by Rosina SAUNDERS, RN  Chest Tube Safety:   all connections secured   all tubing connections taped   petroleum gauze dressing with patient   rubber-tipped hemostat(s) with patient   suction checked

## 2024-02-26 NOTE — Care Plan (Signed)
 [facility]   [address]  [place], [address]  (Office) [phone]  Rehabilitation Department      Physical Therapy Daily Inpatient Note    Date: 02/26/2024  Patient's Name: Jeremy Sexton  Date of Birth: September 26, 1955  Height: Height: 182.9 cm (6' 0.01)  Weight: Weight: 92.5 kg (204 lb)      Chart Review Performed:  Yes  Room/Bed: 25/A  Time: 1556      Equipment:  FWW  Medical Lines: tele  Weight Bearing Status:  No Restrictions  Precautions:  Sternal Precautions    Post Acute Care Recommendations:  HHPT        Subjective:   Pt seen in ICU 25. Agreeable to therapy at this time. Son bedside, Ox4  __________________________________________________________    Pain Level:   8/10 reported in chest  __________________________________________________________    Objective:   The patient performed therapeutic activity to improve bed mobility and transfers.   Bed mobility: The patient performed x1 trial supine to edge of bed min/modA for trunk support.     Transfers: The patient attempted sit to stand from edge of bed at spv level. He was unable to clear gluteals. He completed x1 trial sit to stand from EoB to RW minA, PT providing VC for compliance to sternal precautions.     The patient performed gait training to improve quality of gait. Gait training ws performed over a smooth, firm, tile surface. The patient completed gait trials of 162'x2 spv with a RW, PT providing VC for RW navigation for object avoidance.   __________________________________________________________    Assessment:   The patient demonstrated progress towards his transfer and ambulation goals. He ws able to completed multiple gait trials of 162' at spv level with RW. Patient's son verified that there would be sufficient assistance for the patient at discharge. Future sessions will address completing bed mobility and transfers at spv to modI level.  __________________________________________________________    Plan:  Will continue under current POC.   Frequency: Patient will be seen daily 5 times per week M-F  __________________________________________________________    Flowsheet      02/26/24 1556   Rehab Session   Document Type therapy progress note (daily note)   PT Visit Date 02/26/24   Total PT Minutes: 34   Vital Signs   Pre-Treatment Heart Rate (beats/min) 91   Pre Treatment BP 122/68   Pre SpO2 (%) 91         Intervention minutes: THERAPEUTIC ACTIVITY 14 minutes and GAIT TRAINING 20 MINUTES    Nursing Notified: Yes  Patient Left:  Back to Bed. Per RN request  DENICE Donnice Greenhouse, PT  02/26/2024, 18:16

## 2024-02-26 NOTE — Progress Notes (Signed)
 [facility]  Cardiac Surgery   Progress Note      Jeremy Sexton, Jeremy Sexton  Date of Admission:  02/23/2024  Date of Birth:  1955-12-29    Hospital Day:  LOS: 3 days   Post-op Day:  3 Days Post-Op, S/P  CABG x5, LIMA to LAD, SVG to PDA and PLB, SVG to Diagonal, and SVG to OM   Date of Service:  02/26/2024    SUBJECTIVE:   OOB to chair  No BM, + flatus    OBJECTIVE:  1.5 LNC sat 93%  Afib about 5 minutes last night  IS 1250    Vital Signs:  Temp (24hrs) Max:36.8 C (98.2 F)      Temperature: 36.7 C (98 F) (02/26/24 1100)  BP (Non-Invasive): 132/61 (02/26/24 1400)  MAP (Non-Invasive): 81 mmHG (02/26/24 1400)  Heart Rate: 80 (02/26/24 1400)  Respiratory Rate: 12 (02/26/24 1400)  SpO2: 93 % (02/26/24 1400)    Base (Admission) Weight:  Weight: 92.1 kg (203 lb)  Weight:  Weight: 92.5 kg (204 lb)    Current Inpatient Medications:  acetaminophen  (TYLENOL ) tablet, 650 mg, Oral, Q4H PRN  [Held by provider] amLODIPine  (NORVASC) tablet, 10 mg, Oral, QAM  ascorbic acid  (VITAMIN C ) tablet, 500 mg, Oral, 2x/day  aspirin  chewable tablet 81 mg, 81 mg, Oral, Daily  atorvastatin  (LIPITOR) tablet, 40 mg, Oral, NIGHTLY  bisacodyl  (DULCOLAX) rectal suppository, 10 mg, Rectal, Daily PRN  buPROPion  (WELLBUTRIN ) tablet, 75 mg, Oral, 2x/day  [Held by provider] carvedilol  (COREG ) tablet, 50 mg, Oral, 2x/day-Food  chlorhexidine  gluconate (PERIDEX ) 0.12% mouthwash, 15 mL, Topical, 2x/day  [Held by provider] cloNIDine  (CATAPRES ) tablet, 0.1 mg, Oral, 2x/day  clopidogrel  (PLAVIX ) 75 mg tablet, 75 mg, Oral, Daily  Correction/SSIP insulin  lispro 100 units/mL injection, 2-9 Units, Subcutaneous, Q4H  dextrose  (GLUTOSE) 40% oral gel, 15 g, Oral, Q15 Min PRN  dextrose  50% (0.5 g/mL) injection - syringe, 12.5 g, Intravenous, Q15 Min PRN  docusate sodium  (COLACE) capsule, 100 mg, Oral, 2x/day  furosemide  (LASIX ) 10 mg/mL injection, 20 mg, Intravenous, 2x/day  gabapentin  (NEURONTIN ) capsule 400 mg, 400 mg, Oral, 3x/day  glucagon  (GLUCAGEN)  injection 1 mg, 1 mg, IntraMUSCULAR, Once PRN  heparin  5,000 unit/mL injection, 5,000 Units, Subcutaneous, Q8HRS  HYDROmorphone  (DILAUDID ) 0.5 mg/0.5 mL injection, 0.2 mg, Intravenous, Q4H PRN  ipratropium (ATROVENT ) 0.02% nebulizer solution, 0.5 mg, Nebulization, Q4H PRN  [Held by provider] lisinopril  (PRINIVIL ) tablet, 20 mg, Oral, QAM  magnesium  hydroxide (MILK OF MAGNESIA) 400mg  per 5mL oral liquid, 30 mL, Oral, Daily PRN  [Held by provider] metFORMIN  (GLUCOPHAGE ) tablet, 500 mg, Oral, 2x/day-Food  metoprolol  tartrate (LOPRESSOR ) tablet, 12.5 mg, Oral, Q12H  mupirocin  (BACTROBAN ) 2% topical ointment, , Apply Topically, 2x/day  NS flush syringe, 3 mL, Intracatheter, Q8HRS  NS flush syringe, 3 mL, Intracatheter, Q1H PRN  ondansetron  (ZOFRAN ) 2 mg/mL injection, 4 mg, Intravenous, Q8H PRN  oxyCODONE  (ROXICODONE ) immediate release tablet, 5 mg, Oral, Q4H PRN  oxyCODONE  (ROXICODONE ) immediate release tablet, 10 mg, Oral, Q4H PRN  potassium chloride  (K-DUR) extended release tablet, 10 mEq, Oral, 2x/day-Food  tamsulosin  (FLOMAX ) capsule, 0.4 mg, Oral, Daily after Dinner  [Held by provider] traZODone  (DESYREL ) tablet, 50 mg, Oral, NIGHTLY        Appropriate Home Meds restarted:  Yes    I/O:  I/O last 24 hours to current time:    Intake/Output Summary (Last 24 hours) at 02/26/2024 1539  Last data filed at 02/26/2024 1400  Gross per 24 hour   Intake 903.33 ml  Output 2460 ml   Net -1556.67 ml     I/O last 3 completed shifts:  In: 783.33 [P.O.:480; I.V.:5; Blood:298.33]  Out: 2465 [Urine:2175; Chest Tube:290]    Nutrition/Diet:  DIET CARDIAC (2GM NA, LOW FAT, LOWCHOL)    Hardware (foley):   Date Placed Necessity Reviewed  Date Discontinued    Foley (out day number two)                      Labs:  Reviewed:  I have reviewed all lab results.  Ordered:  CBC, BMP in AM     Radiology:    Reviewed:  FINDINGS:    FINDINGS:      *Discontinued right central line. Persistent subxiphoid catheters.  *Similar cardiomediastinal silhouette.    *No pneumothorax or large pleural effusion.   *Similar atelectasis and mild pulmonary vascular congestion.       IMPRESSION:   Discontinued right central line. Similar aeration.    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 Steen changed RLE dressing, Every other day dressing change orders placed for prisma dry gauze dressing.   -ASA, Plavix , lopressor , Lipitor  -Increase BB to 25 mg po BID  -Continue diuresis at 20 mg IV BID  -Ambulate QID, OOB to chair, PT, IS  -SS for home health    Sari Burrows, FNP-BC 02/26/2024 15:39

## 2024-02-27 ENCOUNTER — Inpatient Hospital Stay (HOSPITAL_COMMUNITY): Payer: MEDICARE

## 2024-02-27 LAB — ELECTROLYTES
ANION GAP: 8 mmol/L (ref 7–18)
CHLORIDE: 103 mmol/L (ref 96–106)
CO2 TOTAL: 25 mmol/L (ref 22–30)
POTASSIUM: 4.3 mmol/L (ref 3.2–5.0)
SODIUM: 136 mmol/L (ref 133–144)

## 2024-02-27 LAB — POC BLOOD GLUCOSE (RESULTS)
GLUCOSE, POC: 154 mg/dL — ABNORMAL HIGH (ref 70–100)
GLUCOSE, POC: 138 mg/dL — ABNORMAL HIGH (ref 70–100)
GLUCOSE, POC: 163 mg/dL — ABNORMAL HIGH (ref 70–100)
GLUCOSE, POC: 163 mg/dL — ABNORMAL HIGH (ref 70–100)
GLUCOSE, POC: 159 mg/dL — ABNORMAL HIGH (ref 70–100)
GLUCOSE, POC: 164 mg/dL — ABNORMAL HIGH (ref 70–100)

## 2024-02-27 LAB — CBC WITH DIFF
BASOPHIL #: <0.1 x10ˆ3/uL (ref <=0.20)
BASOPHIL %: 0.3 %
EOSINOPHIL #: 0.32 x10ˆ3/uL (ref <=0.50)
EOSINOPHIL %: 5.1 %
HCT: 23.9 % — ABNORMAL LOW (ref 38.9–52.0)
HGB: 7.8 g/dL — ABNORMAL LOW (ref 13.4–17.5)
IMMATURE GRANULOCYTE #: <0.1 x10ˆ3/uL (ref <0.10)
IMMATURE GRANULOCYTE %: 0.3 % (ref 0.0–1.0)
LYMPHOCYTE #: 1.5 x10ˆ3/uL (ref 1.00–4.80)
LYMPHOCYTE %: 23.7 %
MCH: 27.4 pg (ref 26.0–32.0)
MCHC: 32.6 g/dL (ref 31.0–35.5)
MCV: 83.9 fL (ref 78.0–100.0)
MONOCYTE #: 0.85 x10ˆ3/uL (ref 0.20–1.10)
MONOCYTE %: 13.4 %
MPV: 11.1 fL (ref 8.7–12.5)
NEUTROPHIL #: 3.62 x10ˆ3/uL (ref 1.50–7.70)
NEUTROPHIL %: 57.2 %
PLATELETS: 119 x10ˆ3/uL — ABNORMAL LOW (ref 150–400)
RBC: 2.85 x10ˆ6/uL — ABNORMAL LOW (ref 4.50–6.10)
RDW-CV: 15.5 % (ref 11.5–15.5)
WBC: 6.3 x10ˆ3/uL (ref 3.7–11.0)

## 2024-02-27 LAB — CREATININE WITH EGFR
CREATININE: 0.88 mg/dL (ref 0.70–1.20)
ESTIMATED GFR: >90 mL/min/1.73mˆ2 (ref >90)

## 2024-02-27 LAB — BUN: BUN: 29 mg/dL — ABNORMAL HIGH (ref 8–23)

## 2024-02-27 LAB — MAGNESIUM: MAGNESIUM: 2.9 mg/dL — ABNORMAL HIGH (ref 1.6–2.4)

## 2024-02-27 NOTE — Ancillary Notes (Signed)
 Consult received for post O[facility] diet education. Jeremy Sexton is s/p CABG x 5 on 7/29. Not in room at time of visit. Family reports pt is doing his laps on the unit. DM/heart healthy appropriate for post OHS left at bedside for pt to review along with RD contact number.

## 2024-02-27 NOTE — Care Plan (Signed)
 Problem: Fall Injury Risk  Goal: Absence of Fall and Fall-Related Injury  Outcome: Ongoing (see interventions/notes)  Intervention: Identify and Manage Contributors  Recent Flowsheet Documentation  Taken 02/27/2024 1600 by Zane CROME, RN  Medication Review/Management: medications reviewed  Taken 02/27/2024 1200 by Zane CROME, RN  Self-Care Promotion: independence encouraged  Medication Review/Management: medications reviewed  Taken 02/27/2024 0800 by Zane CROME, RN  Self-Care Promotion: independence encouraged  Medication Review/Management: medications reviewed  Intervention: Promote Injury-Free Environment  Recent Flowsheet Documentation  Taken 02/27/2024 1800 by Zane CROME, RN  Safety Promotion/Fall Prevention:   activity supervised   safety round/check completed  Taken 02/27/2024 1600 by Zane CROME, RN  Safety Promotion/Fall Prevention:   activity supervised   safety round/check completed  Taken 02/27/2024 1400 by Zane CROME, RN  Safety Promotion/Fall Prevention:   activity supervised   safety round/check completed  Taken 02/27/2024 1200 by Zane CROME, RN  Safety Promotion/Fall Prevention:   activity supervised   safety round/check completed  Taken 02/27/2024 1000 by Zane CROME, RN  Safety Promotion/Fall Prevention:   activity supervised   safety round/check completed  Taken 02/27/2024 0800 by Zane CROME, RN  Safety Promotion/Fall Prevention:   activity supervised   safety round/check completed     Problem: Health Knowledge, Opportunity to Enhance (Adult,Obstetrics,Pediatric)  Goal: Knowledgeable about Health Subject/Topic  Description: Patient will demonstrate the desired outcomes by discharge/transition of care.  Outcome: Ongoing (see interventions/notes)  Intervention: Enhance Health Knowledge  Recent Flowsheet Documentation  Taken 02/27/2024 1200 by Zane CROME, RN  Supportive Measures: active listening utilized  Taken 02/27/2024 0800 by Zane CROME, RN  Supportive Measures: active listening utilized     Problem: Infection  Goal:  Absence of Infection Signs and Symptoms  Outcome: Ongoing (see interventions/notes)  Intervention: Prevent or Manage Infection  Recent Flowsheet Documentation  Taken 02/27/2024 1600 by Zane CROME, RN  Fever Reduction/Comfort Measures:   lightweight bedding   lightweight clothing  Infection Management: aseptic technique maintained  Taken 02/27/2024 1200 by Zane CROME, RN  Infection Management: aseptic technique maintained  Taken 02/27/2024 0800 by Aleena Kirkeby L, RN  Fever Reduction/Comfort Measures:   lightweight bedding   lightweight clothing  Infection Management: aseptic technique maintained     Problem: Pain Acute  Goal: Optimal Pain Control and Function  Outcome: Ongoing (see interventions/notes)  Intervention: Optimize Psychosocial Wellbeing  Recent Flowsheet Documentation  Taken 02/27/2024 1200 by Zane CROME, RN  Supportive Measures: active listening utilized  Taken 02/27/2024 0800 by Zane CROME, RN  Supportive Measures: active listening utilized  Intervention: Prevent or Manage Pain  Recent Flowsheet Documentation  Taken 02/27/2024 1600 by Zane CROME, RN  Sleep/Rest Enhancement: awakenings minimized  Bowel Elimination Promotion: adequate fluid intake promoted  Medication Review/Management: medications reviewed  Taken 02/27/2024 1200 by Zane CROME, RN  Sensory Stimulation Regulation: auditory stimulation minimized  Bowel Elimination Promotion: adequate fluid intake promoted  Medication Review/Management: medications reviewed  Taken 02/27/2024 0800 by Zane CROME, RN  Sensory Stimulation Regulation: auditory stimulation minimized  Sleep/Rest Enhancement: awakenings minimized  Bowel Elimination Promotion: adequate fluid intake promoted  Medication Review/Management: medications reviewed

## 2024-02-27 NOTE — Progress Notes (Signed)
 [facility]  Cardiac Surgery   Floor/Stepdown Progress Note      Dion, Sibal  Date of Admission:  02/23/2024  Date of Birth:  1955-08-02    Hospital Day:  LOS: 4 days   Post-op Day:  4 Days Post-Op, S/P  CABG x 5 with LIMA to LAD, SVG-PDA and PLB, SVG- Diagonal, SVG-OM  Date of Service:  02/27/2024    SUBJECTIVE:   Sitting up in bedside chair resting comfortably     OBJECTIVE:  + BM   RA   TPW/CT removed yesterday     Vital Signs:  Temp (24hrs) Max:37.1 C (98.7 F)      Temperature: 37.1 C (98.7 F) (02/27/24 1100)  BP (Non-Invasive): (!) 153/62 (02/27/24 0800)  MAP (Non-Invasive): 86 mmHG (02/27/24 0800)  Heart Rate: 87 (02/27/24 0800)  Respiratory Rate: 20 (02/27/24 0800)  SpO2: 100 % (02/27/24 0800)    Base (Admission) Weight:  Weight: 92.1 kg (203 lb)  Weight:  Weight: 93 kg (205 lb 0.4 oz)    Current Inpatient Medications:  acetaminophen  (TYLENOL ) tablet, 650 mg, Oral, Q4H PRN  Adult Nursing Driven Electrolyte Replacement Protocol Placeholder, , Other, Daily PRN  [Held by provider] amLODIPine  (NORVASC) tablet, 10 mg, Oral, QAM  ascorbic acid  (VITAMIN C ) tablet, 500 mg, Oral, 2x/day  aspirin  chewable tablet 81 mg, 81 mg, Oral, Daily  atorvastatin  (LIPITOR) tablet, 40 mg, Oral, NIGHTLY  bisacodyl  (DULCOLAX) rectal suppository, 10 mg, Rectal, Daily PRN  bisacodyl  (DULCOLAX) rectal suppository, 10 mg, Rectal, 2x/day  buPROPion  (WELLBUTRIN ) tablet, 75 mg, Oral, 2x/day  [Held by provider] carvedilol  (COREG ) tablet, 50 mg, Oral, 2x/day-Food  chlorhexidine  gluconate (PERIDEX ) 0.12% mouthwash, 15 mL, Topical, 2x/day  [Held by provider] cloNIDine  (CATAPRES ) tablet, 0.1 mg, Oral, 2x/day  clopidogrel  (PLAVIX ) 75 mg tablet, 75 mg, Oral, Daily  Correction/SSIP insulin  lispro 100 units/mL injection, 2-9 Units, Subcutaneous, Q4H  dextrose  (GLUTOSE) 40% oral gel, 15 g, Oral, Q15 Min PRN  dextrose  50% (0.5 g/mL) injection - syringe, 12.5 g, Intravenous, Q15 Min PRN  docusate sodium  (COLACE) capsule, 100 mg,  Oral, 2x/day  furosemide  (LASIX ) 10 mg/mL injection, 20 mg, Intravenous, 2x/day  gabapentin  (NEURONTIN ) capsule 400 mg, 400 mg, Oral, 3x/day  glucagon  (GLUCAGEN) injection 1 mg, 1 mg, IntraMUSCULAR, Once PRN  heparin  5,000 unit/mL injection, 5,000 Units, Subcutaneous, Q8HRS  ipratropium (ATROVENT ) 0.02% nebulizer solution, 0.5 mg, Nebulization, Q4H PRN  [Held by provider] lisinopril  (PRINIVIL ) tablet, 20 mg, Oral, QAM  magnesium  hydroxide (MILK OF MAGNESIA) 400mg  per 5mL oral liquid, 30 mL, Oral, Daily PRN  magnesium  hydroxide (MILK OF MAGNESIA) 400mg  per 5mL oral liquid, 15 mL, Oral, Daily  metFORMIN  (GLUCOPHAGE ) tablet, 500 mg, Oral, 2x/day-Food  metoprolol  tartrate (LOPRESSOR ) tablet, 25 mg, Oral, Q12H  mupirocin  (BACTROBAN ) 2% topical ointment, , Apply Topically, 2x/day  NS flush syringe, 3 mL, Intracatheter, Q8HRS  NS flush syringe, 3 mL, Intracatheter, Q1H PRN  ondansetron  (ZOFRAN ) 2 mg/mL injection, 4 mg, Intravenous, Q8H PRN  oxyCODONE  (ROXICODONE ) immediate release tablet, 5 mg, Oral, Q4H PRN  oxyCODONE  (ROXICODONE ) immediate release tablet, 10 mg, Oral, Q4H PRN  potassium chloride  (K-DUR) extended release tablet, 10 mEq, Oral, 2x/day-Food  tamsulosin  (FLOMAX ) capsule, 0.4 mg, Oral, Daily after Dinner  traZODone  (DESYREL ) tablet, 50 mg, Oral, NIGHTLY      Appropriate Home Meds restarted:  Yes    I/O:  I/O last 24 hours to current time:    Intake/Output Summary (Last 24 hours) at 02/27/2024 1138  Last data filed at 02/27/2024 0600  Gross per 24 hour   Intake 480 ml   Output 1570 ml   Net -1090 ml     I/O last 3 completed shifts:  In: 600 [P.O.:600]  Out: 2230 [Urine:2080; Chest Tube:150]    Nutrition/Diet:  DIET CARDIAC (2GM NA, LOW FAT, LOWCHOL)      Labs:  Reviewed:  Lab Results Today:    Results for orders placed or performed during the hospital encounter of 02/23/24 (from the past 24 hours)   POC BLOOD GLUCOSE (RESULTS)   Result Value Ref Range    GLUCOSE, POC 173 (H) 70 - 100 mg/dl   POC BLOOD GLUCOSE  (RESULTS)   Result Value Ref Range    GLUCOSE, POC 181 (H) 70 - 100 mg/dl   POC BLOOD GLUCOSE (RESULTS)   Result Value Ref Range    GLUCOSE, POC 148 (H) 70 - 100 mg/dl   POC BLOOD GLUCOSE (RESULTS)   Result Value Ref Range    GLUCOSE, POC 154 (H) 70 - 100 mg/dl   POC BLOOD GLUCOSE (RESULTS)   Result Value Ref Range    GLUCOSE, POC 138 (H) 70 - 100 mg/dl   ELECTROLYTES   Result Value Ref Range    SODIUM 136 133 - 144 mmol/L    POTASSIUM 4.3 3.2 - 5.0 mmol/L    CHLORIDE 103 96 - 106 mmol/L    CO2 TOTAL 25 22 - 30 mmol/L    ANION GAP 8 7 - 18 mmol/L   BUN   Result Value Ref Range    BUN 29 (H) 8 - 23 mg/dL   CREATININE   Result Value Ref Range    CREATININE 0.88 0.70 - 1.20 mg/dL    ESTIMATED GFR >09 >09 mL/min/1.32m^2   MAGNESIUM    Result Value Ref Range    MAGNESIUM  2.9 (H) 1.6 - 2.4 mg/dL   CBC WITH DIFF   Result Value Ref Range    WBC 6.3 3.7 - 11.0 x10^3/uL    RBC 2.85 (L) 4.50 - 6.10 x10^6/uL    HGB 7.8 (L) 13.4 - 17.5 g/dL    HCT 76.0 (L) 61.0 - 52.0 %    MCV 83.9 78.0 - 100.0 fL    MCH 27.4 26.0 - 32.0 pg    MCHC 32.6 31.0 - 35.5 g/dL    RDW-CV 84.4 88.4 - 84.4 %    PLATELETS 119 (L) 150 - 400 x10^3/uL    MPV 11.1 8.7 - 12.5 fL    NEUTROPHIL % 57.2 %    LYMPHOCYTE % 23.7 %    MONOCYTE % 13.4 %    EOSINOPHIL % 5.1 %    BASOPHIL % 0.3 %    NEUTROPHIL # 3.62 1.50 - 7.70 x10^3/uL    LYMPHOCYTE # 1.50 1.00 - 4.80 x10^3/uL    MONOCYTE # 0.85 0.20 - 1.10 x10^3/uL    EOSINOPHIL # 0.32 <=0.50 x10^3/uL    BASOPHIL # <0.10 <=0.20 x10^3/uL    IMMATURE GRANULOCYTE % 0.3 0.0 - 1.0 %    IMMATURE GRANULOCYTE # <0.10 <0.10 x10^3/uL   POC BLOOD GLUCOSE (RESULTS)   Result Value Ref Range    GLUCOSE, POC 163 (H) 70 - 100 mg/dl       Radiology:    Reviewed:  IMPRESSION:     Decreasing atelectasis in the lungs but otherwise unchanged appearance of the chest compared to 02/26/2024 with likely trace bilateral pleural effusions.      Microbiology:  pre-op urine negative     Physical  Exam:    Constitutional:  appears in good health,  comfortable  Respiratory:  Breathing nonlabored, Clear to auscultation bilaterally.   Cardiovascular:  regular rate and rhythm, S1, S2 normal, no murmur, click, rub or gallop  Gastrointestinal:  non-distended, Soft, non-tender  Musculoskeletal:  Head atraumatic and normocephalic  Integumentary:  Skin warm and dry  Neurologic:  Grossly normal. Alert and oriented x3  Psychiatric:  Normal affect, behavior, memory, thought content, judgement, and speech.    ASSESSMENT/PLAN:    CXR stable   Voiding after foley removal   Podiatrist, Dr. Steen for dressing changes every other day- prisma dry gauze dressing   Continue with diuresis   OOB to chair/ambulation   Aggressive IS/pulmonary toilet   Subq heparin  for DVT ppx   Transfer to SDU when bed available   Asa/plavix /lipitor/metoprolol  (takes home coreg  50 BID)     Problem List:  Active Hospital Problems    Diagnosis    Primary Problem: S/P CABG (coronary artery bypass graft)    Nonhealing nonsurgical wound with fat layer exposed    Cardiac insufficiency following cardiac surgery    Lactic acidosis    Hypovolemic shock (CMS HCC)       PT/OT: Yes  DVT RISK FACTORS HAVE BEN ASSESSED AND PROPHYLAXIS ORDERED (SEE RUBYONLINE - REFERENCE TOOLS - MD, DVT PROPHY OR POCKET CARD):  YES  Disposition Planning: Home discharge  and Home Health     Duwaine Devonshire, [facility] 02/27/2024 11:38

## 2024-02-27 NOTE — Progress Notes (Signed)
 [facility]  Progress Note    [name]  Date of service: 02/27/2024   Date of Admission:  02/23/2024    Hospital Day:  LOS: 4 days     Chief complaint: weak    Subjective: Jeremy Sexton is a 68 y.o., White male who presents with a known history of coronary artery disease underwent left heart catheterization by Dr. Neomi that revealed multivessel coronary disease.  He was referred to Dr. Skipper for coronary artery bypass graft.  He underwent procedure today CABG x 5 and was taken to the open heart recovery intubated on IV vasopressors.  We have been asked to see for critical care management.  He is currently intubated I will give review of systems or HPI     7/30- up to chair. Breathing is stable. Pulling on IS.     7/31: up to chair today. 2L NC - Cr. Bumped up, having urinary retention issues. Was given fluid bolus, for foley placement today.      8/1: 2L NC - feels better. AKI improved. Foley in place. Good output. To DC tubes and wires today.      8/2: room air. Up to chair. Tubes and wires DC yesterday. Tx out of icu today      ROS  Gen: No fever, aches, chills  HEENT: No headache ,nasal congestion, sore throat  Lungs: +mild SOB, cough  Heart: No chest pain, palpitations, syncope  Skin: No rashes, lesions, pruritus    Vital Signs:  Temp  Avg: 36.7 C (98 F)  Min: 36.5 C (97.7 F)  Max: 37.1 C (98.7 F)    Pulse  Avg: 86.2  Min: 77  Max: 104 BP  Min: 111/63  Max: 160/143   Resp  Avg: 15.3  Min: 10  Max: 28 SpO2  Avg: 94.2 %  Min: 91 %  Max: 100 %          Input/Output    Intake/Output Summary (Last 24 hours) at 02/27/2024 1207  Last data filed at 02/27/2024 0600  Gross per 24 hour   Intake 480 ml   Output 1500 ml   Net -1020 ml    I/O last shift:  No intake/output data recorded.   acetaminophen  (TYLENOL ) tablet, 650 mg, Oral, Q4H PRN  Adult Nursing Driven Electrolyte Replacement Protocol Placeholder, , Other, Daily PRN  [Held by provider] amLODIPine  (NORVASC) tablet, 10 mg, Oral,  QAM  ascorbic acid  (VITAMIN C ) tablet, 500 mg, Oral, 2x/day  aspirin  chewable tablet 81 mg, 81 mg, Oral, Daily  atorvastatin  (LIPITOR) tablet, 40 mg, Oral, NIGHTLY  bisacodyl  (DULCOLAX) rectal suppository, 10 mg, Rectal, Daily PRN  bisacodyl  (DULCOLAX) rectal suppository, 10 mg, Rectal, 2x/day  buPROPion  (WELLBUTRIN ) tablet, 75 mg, Oral, 2x/day  [Held by provider] carvedilol  (COREG ) tablet, 50 mg, Oral, 2x/day-Food  chlorhexidine  gluconate (PERIDEX ) 0.12% mouthwash, 15 mL, Topical, 2x/day  [Held by provider] cloNIDine  (CATAPRES ) tablet, 0.1 mg, Oral, 2x/day  clopidogrel  (PLAVIX ) 75 mg tablet, 75 mg, Oral, Daily  Correction/SSIP insulin  lispro 100 units/mL injection, 2-9 Units, Subcutaneous, Q4H  dextrose  (GLUTOSE) 40% oral gel, 15 g, Oral, Q15 Min PRN  dextrose  50% (0.5 g/mL) injection - syringe, 12.5 g, Intravenous, Q15 Min PRN  docusate sodium  (COLACE) capsule, 100 mg, Oral, 2x/day  furosemide  (LASIX ) 10 mg/mL injection, 20 mg, Intravenous, 2x/day  gabapentin  (NEURONTIN ) capsule 400 mg, 400 mg, Oral, 3x/day  glucagon  (GLUCAGEN) injection 1 mg, 1 mg, IntraMUSCULAR, Once PRN  heparin  5,000 unit/mL injection, 5,000  Units, Subcutaneous, Q8HRS  ipratropium (ATROVENT ) 0.02% nebulizer solution, 0.5 mg, Nebulization, Q4H PRN  [Held by provider] lisinopril  (PRINIVIL ) tablet, 20 mg, Oral, QAM  magnesium  hydroxide (MILK OF MAGNESIA) 400mg  per 5mL oral liquid, 30 mL, Oral, Daily PRN  magnesium  hydroxide (MILK OF MAGNESIA) 400mg  per 5mL oral liquid, 15 mL, Oral, Daily  metFORMIN  (GLUCOPHAGE ) tablet, 500 mg, Oral, 2x/day-Food  metoprolol  tartrate (LOPRESSOR ) tablet, 25 mg, Oral, Q12H  mupirocin  (BACTROBAN ) 2% topical ointment, , Apply Topically, 2x/day  NS flush syringe, 3 mL, Intracatheter, Q8HRS  NS flush syringe, 3 mL, Intracatheter, Q1H PRN  ondansetron  (ZOFRAN ) 2 mg/mL injection, 4 mg, Intravenous, Q8H PRN  oxyCODONE  (ROXICODONE ) immediate release tablet, 5 mg, Oral, Q4H PRN  oxyCODONE  (ROXICODONE ) immediate release  tablet, 10 mg, Oral, Q4H PRN  potassium chloride  (K-DUR) extended release tablet, 10 mEq, Oral, 2x/day-Food  tamsulosin  (FLOMAX ) capsule, 0.4 mg, Oral, Daily after Dinner  traZODone  (DESYREL ) tablet, 50 mg, Oral, NIGHTLY        Physical Exam:  Constitutional: awake, alert, appears stated age. No acute distress noted.    Eyes: Pupils equal and round, reactive to light and accomodation. , Sclera non-icteric. , normal findings: lids and lashes normal and conjunctivae and sclerae normal  ENT: Head atraumatic and normocephalic, External Ears: normal pinnae shape and position and no signs of inflammation, Nose without erythema. , Mouth mucous membranes moist. , No oral lesions.   Neck: no thyromegaly or lymphadenopathy, supple, symmetrical, trachea midline, and no adenopathy.   Respiratory:   Clear with diminished bases, chest tube in place  Cardiovascular: regular rate and rhythm, S1, S2 normal.    Gastrointestinal: non-distended, Soft, non-tender, Bowel sounds normal, No hepatosplenomegaly, No masses.    Musculoskeletal:  moves all 4 extremities , no edema noted.   Integumentary:  Skin warm and dry and Skin color, texture, turgor normal. No rashes or lesions  Neurologic: Grossly normal.  Following commands, CN II - XII grossly intact , No tremor  Lymphatic/Immunologic/Hematologic: No lymphadenopathy  Psychiatric:  Pleasant affect.  Insight and judgment seem intact.    Labs:     Results for orders placed or performed during the hospital encounter of 02/23/24 (from the past 24 hours)   POC BLOOD GLUCOSE (RESULTS)   Result Value Ref Range    GLUCOSE, POC 173 (H) 70 - 100 mg/dl   POC BLOOD GLUCOSE (RESULTS)   Result Value Ref Range    GLUCOSE, POC 181 (H) 70 - 100 mg/dl   POC BLOOD GLUCOSE (RESULTS)   Result Value Ref Range    GLUCOSE, POC 148 (H) 70 - 100 mg/dl   POC BLOOD GLUCOSE (RESULTS)   Result Value Ref Range    GLUCOSE, POC 154 (H) 70 - 100 mg/dl   POC BLOOD GLUCOSE (RESULTS)   Result Value Ref Range    GLUCOSE, POC  138 (H) 70 - 100 mg/dl   ELECTROLYTES   Result Value Ref Range    SODIUM 136 133 - 144 mmol/L    POTASSIUM 4.3 3.2 - 5.0 mmol/L    CHLORIDE 103 96 - 106 mmol/L    CO2 TOTAL 25 22 - 30 mmol/L    ANION GAP 8 7 - 18 mmol/L   BUN   Result Value Ref Range    BUN 29 (H) 8 - 23 mg/dL   CREATININE   Result Value Ref Range    CREATININE 0.88 0.70 - 1.20 mg/dL    ESTIMATED GFR >09 >09 mL/min/1.52m^2   MAGNESIUM   Result Value Ref Range    MAGNESIUM  2.9 (H) 1.6 - 2.4 mg/dL   CBC WITH DIFF   Result Value Ref Range    WBC 6.3 3.7 - 11.0 x10^3/uL    RBC 2.85 (L) 4.50 - 6.10 x10^6/uL    HGB 7.8 (L) 13.4 - 17.5 g/dL    HCT 76.0 (L) 61.0 - 52.0 %    MCV 83.9 78.0 - 100.0 fL    MCH 27.4 26.0 - 32.0 pg    MCHC 32.6 31.0 - 35.5 g/dL    RDW-CV 84.4 88.4 - 84.4 %    PLATELETS 119 (L) 150 - 400 x10^3/uL    MPV 11.1 8.7 - 12.5 fL    NEUTROPHIL % 57.2 %    LYMPHOCYTE % 23.7 %    MONOCYTE % 13.4 %    EOSINOPHIL % 5.1 %    BASOPHIL % 0.3 %    NEUTROPHIL # 3.62 1.50 - 7.70 x10^3/uL    LYMPHOCYTE # 1.50 1.00 - 4.80 x10^3/uL    MONOCYTE # 0.85 0.20 - 1.10 x10^3/uL    EOSINOPHIL # 0.32 <=0.50 x10^3/uL    BASOPHIL # <0.10 <=0.20 x10^3/uL    IMMATURE GRANULOCYTE % 0.3 0.0 - 1.0 %    IMMATURE GRANULOCYTE # <0.10 <0.10 x10^3/uL   POC BLOOD GLUCOSE (RESULTS)   Result Value Ref Range    GLUCOSE, POC 163 (H) 70 - 100 mg/dl      Radiology:      Results for orders placed or performed during the hospital encounter of 02/23/24 (from the past 2160 hours)   XR CHEST AP MOBILE     Status: None    Narrative    Marilyn W Quirarte    XR AP MOBILE CHEST  02/23/2024 1:10 PM    INDICATION:  Immediate Post-Op Exam - evaluation for effusion  evaluation for effusion    TECHNIQUE:  Portable frontal view obtained on 1 image(s).    COMPARISON:  None available.    FINDINGS:    Impression    See impression:      IMPRESSION:    CABG. ET and NG tubes in satisfactory position.   Tubes or drains project over the left mid chest and upper mid abdomen.  Right IJ central venous catheter  sheath.      Heart size is normal.  No focal consolidation or effusion.  No pneumothorax or evidence of pneumomediastinum.        Radiologist location ID: [id number]     XR AP MOBILE CHEST     Status: None    Narrative    Jayziah W Cansler    XR AP MOBILE CHEST performed on 02/24/2024 4:38 AM.    INDICATION:  Status-Post Cardiothoracic Surgery - evaluation for effusion   Additional History:  follow up    TECHNIQUE:  Single portable frontal view of the chest.  1 views/1 images submitted for interpretation.    COMPARISON:  Yesterday    ___________________________________  FINDINGS:     * Interval extubation and NG tube removal. Similar right central line. Persistent but altered position of the subxiphoid catheters.  * No pneumothorax.  * Similar cardiomediastinal silhouette and aeration.    ___________________________________    Impression    No acute cardiopulmonary findings. Discontinued ETT and NGT. Altered subxiphoid catheter position.         Radiologist location ID: [id number]     XR KUB     Status: None    Narrative    Delvis  W Forquer    XR KUB performed on 02/24/2024 11:55 AM.    INDICATION:  evaluate bowel r/o ileus   Additional History:  adominal distension, evaluate bowel r/o ileus    TECHNIQUE:  Single supine KUB view of the abdomen.  1 views/1 images submitted for interpretation.    COMPARISON:  None available.  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    Nonspecific bowel gas pattern. No renal calcifications.  ___________________________________    Impression    Nonspecific bowel gas pattern.      Radiologist location ID: [id number]     XR AP MOBILE CHEST     Status: None    Narrative    Jontae W Commins    XR AP MOBILE CHEST performed on 02/25/2024 4:55 AM.    INDICATION:  Status-Post Cardiothoracic Surgery - evaluation for effusion   Additional History:  Status-Post Cardiothoracic Surgery - evaluation for effusion    TECHNIQUE:  Single  portable frontal view of the chest.  1 views/1 images submitted for interpretation.    COMPARISON:  Yesterday    ___________________________________  FINDINGS:     * Similar support devices.  * Similar cardiomediastinal silhouette.   * No pneumothorax or large pleural effusion.   * Similar streaky perihilar and right basal atelectasis. No pulmonary edema or new consolidation.    ___________________________________    Impression    No significant interval change.           Radiologist location ID: [id number]     XR AP MOBILE CHEST     Status: None    Narrative    Vern W Knudtson    XR AP MOBILE CHEST performed on 02/26/2024 5:08 AM.    INDICATION:  Status-Post Cardiothoracic Surgery - evaluation for effusion   Additional History:  follow up    TECHNIQUE:  Single portable frontal view of the chest.  1 views/1 images submitted for interpretation.    COMPARISON:  02/25/2024.    ___________________________________  FINDINGS:     * Discontinued right central line. Persistent subxiphoid catheters.  * Similar cardiomediastinal silhouette.   * No pneumothorax or large pleural effusion.   * Similar atelectasis and mild pulmonary vascular congestion.    ___________________________________    Impression    Discontinued right central line. Similar aeration.         Radiologist location ID: [id number]     XR AP MOBILE CHEST     Status: None    Narrative    Corrie W Robison    XR AP MOBILE CHEST performed on 02/26/2024 9:00 PM.    INDICATION:  chest tube removal   Additional History:  Chest tube removal    TECHNIQUE:  Single portable frontal view of the chest.  1 views/1 images submitted for interpretation.    COMPARISON:  Earlier today.    Number of Views: 1 views/1 images.    FINDINGS:  Removal of left thoracostomy tube.  Mediastinal contours are unchanged. Sternotomy with CABG.    No evidence of pneumothorax. Perihilar atelectasis. Unchanged small effusions. No new airspace opacity or edema.      Impression    As  above.    .  .  .  s/d/g          Radiologist location ID: [id number]     XR AP MOBILE CHEST     Status: None    Narrative    Yared W Faerber    XR  AP MOBILE CHEST performed on 02/27/2024 5:30 AM.    INDICATION:  Evaluation for effusion, upright inspiratory view   Additional History:  follow up    TECHNIQUE:  Single portable frontal view of the chest.  1 views/1 images submitted for interpretation.    COMPARISON:  02/26/2024  ___________________________________  FINDINGS:    Sternotomy wires. Unchanged mediastinal contours.    Decreasing atelectasis in the lungs. No new focal opacities. A likely trace bilateral pleural effusions. No visible pneumothorax.    No acute abnormality of the visualized bones.  ___________________________________    Impression    Decreasing atelectasis in the lungs but otherwise unchanged appearance of the chest compared to 02/26/2024 with likely trace bilateral pleural effusions.        Radiologist location ID: [id number]        Consults:   Assessment/ Plan:   Active Hospital Problems    Diagnosis    Primary Problem: S/P CABG (coronary artery bypass graft)    Nonhealing nonsurgical wound with fat layer exposed    Cardiac insufficiency following cardiac surgery    Lactic acidosis    Hypovolemic shock (CMS HCC)     Patient seen and examined  Chart reviewed  Tele shows sinus  CXR reviewed, mild atelectasis  67y/o s/p CABG with h/o DM and PVD. Lifelong non smoker. No h/o lung disease.  Up to chair and using IS  Waiting for tx out of icu  Foley placed for AKI/retention  Pain managed pretty well  Continue pushing IS/acapella  Wean O2 as able for SpO2 >/=92%  Will discuss with my attending         Larraine LITTIE Marc, APRN-FNP-BC,            Attending physician:  I have seen and examined the patient.  Medications, labs, images reviewed.    Pulmonary toilet, incentive spirometer, out of bed as tolerated.  Supplemental oxygen with nebs as needed.  Continue aspirin , Plavix , Lipitor,  metoprolol .  Continue Lasix  - monitor creatinine, urine output, electrolytes and replace as needed.    Accu-Cheks with SSI.    Ensure adequate pain control.    Diet as tolerated.      Patient is medically complex with increased risk for sudden decompensation and requires close monitoring of vitals, labs, and/or imaging.      Manus Free, DO   02/27/2024  13:30

## 2024-02-27 NOTE — Nurses Notes (Signed)
-  0930 patient ambulated in hallway 500 ft with no complications. HR 80-105 NSR, RR 15-20, O2 sats 95-100%.    -1430 patient ambulated in hallway 500 ft with no complications. HR 80-100 NSR, RR 15-20, O2 sats 95-100%    -1630 patient ambulated in hallway 500 ft with no complications. HR 75-100 NSR, RR 15-21, O2 sats 95-100%

## 2024-02-28 ENCOUNTER — Inpatient Hospital Stay (HOSPITAL_COMMUNITY): Payer: MEDICARE

## 2024-02-28 LAB — CBC WITH DIFF
BASOPHIL #: <0.1 x10ˆ3/uL (ref <=0.20)
BASOPHIL %: 0.3 %
EOSINOPHIL #: 0.64 x10ˆ3/uL — ABNORMAL HIGH (ref <=0.50)
EOSINOPHIL %: 9.4 %
HCT: 25 % — ABNORMAL LOW (ref 38.9–52.0)
HGB: 7.8 g/dL — ABNORMAL LOW (ref 13.4–17.5)
IMMATURE GRANULOCYTE #: <0.1 x10ˆ3/uL (ref <0.10)
IMMATURE GRANULOCYTE %: 0.4 % (ref 0.0–1.0)
LYMPHOCYTE #: 1.29 x10ˆ3/uL (ref 1.00–4.80)
LYMPHOCYTE %: 19 %
MCH: 26.8 pg (ref 26.0–32.0)
MCHC: 31.2 g/dL (ref 31.0–35.5)
MCV: 85.9 fL (ref 78.0–100.0)
MONOCYTE #: 0.94 x10ˆ3/uL (ref 0.20–1.10)
MONOCYTE %: 13.8 %
MPV: 10.8 fL (ref 8.7–12.5)
NEUTROPHIL #: 3.87 x10ˆ3/uL (ref 1.50–7.70)
NEUTROPHIL %: 57.1 %
PLATELETS: 143 x10ˆ3/uL — ABNORMAL LOW (ref 150–400)
RBC: 2.91 x10ˆ6/uL — ABNORMAL LOW (ref 4.50–6.10)
RDW-CV: 15.7 % — ABNORMAL HIGH (ref 11.5–15.5)
WBC: 6.8 x10ˆ3/uL (ref 3.7–11.0)

## 2024-02-28 LAB — ELECTROLYTES
ANION GAP: 7 mmol/L (ref 7–18)
CHLORIDE: 105 mmol/L (ref 96–106)
CO2 TOTAL: 23 mmol/L (ref 22–30)
POTASSIUM: 4.1 mmol/L (ref 3.2–5.0)
SODIUM: 135 mmol/L (ref 133–144)

## 2024-02-28 LAB — POC BLOOD GLUCOSE (RESULTS)
GLUCOSE, POC: 113 mg/dL — ABNORMAL HIGH (ref 70–100)
GLUCOSE, POC: 157 mg/dL — ABNORMAL HIGH (ref 70–100)

## 2024-02-28 LAB — PARACHUTE DME: Item Description: 3

## 2024-02-28 LAB — BUN: BUN: 26 mg/dL — ABNORMAL HIGH (ref 8–23)

## 2024-02-28 LAB — CREATININE WITH EGFR
CREATININE: 0.87 mg/dL (ref 0.70–1.20)
ESTIMATED GFR: >90 mL/min/1.73mˆ2 (ref >90)

## 2024-02-28 LAB — MAGNESIUM: MAGNESIUM: 2.7 mg/dL — ABNORMAL HIGH (ref 1.6–2.4)

## 2024-02-28 MED ORDER — HYDROCODONE 5 MG-ACETAMINOPHEN 325 MG TABLET
1.0000 | ORAL_TABLET | Freq: Four times a day (QID) | ORAL | 0 refills | Status: AC | PRN
Start: 2024-02-28 — End: 2024-03-22

## 2024-02-28 MED ORDER — CLOPIDOGREL 75 MG TABLET
75.0000 mg | ORAL_TABLET | Freq: Every day | ORAL | 3 refills | Status: AC
Start: 2024-02-29 — End: ?

## 2024-02-28 MED ORDER — TAMSULOSIN 0.4 MG CAPSULE
0.4000 mg | ORAL_CAPSULE | Freq: Every evening | ORAL | 0 refills | Status: AC
Start: 2024-02-29 — End: 2024-03-30

## 2024-02-28 MED ORDER — FUROSEMIDE 20 MG TABLET
20.0000 mg | ORAL_TABLET | Freq: Two times a day (BID) | ORAL | Status: DC
Start: 2024-02-28 — End: 2024-02-28
  Administered 2024-02-28: 20 mg via ORAL
  Filled 2024-02-28: qty 1

## 2024-02-28 MED ORDER — METOPROLOL TARTRATE 25 MG TABLET
25.0000 mg | ORAL_TABLET | Freq: Two times a day (BID) | ORAL | 3 refills | Status: AC
Start: 2024-02-28 — End: ?

## 2024-02-28 NOTE — Nurses Notes (Signed)
 Pt received all discharge information and signed belongings sheet. His family member and him stated that they understood all discharge instructions and will follow up. Pt denied having any concerns or questions regarding discharge info. IV removed without difficulty. Tolerated well. Assisted patient and family to there vehicle. Pt stated he was satisfied with our services and thanked all staff.

## 2024-02-28 NOTE — Discharge Instructions (Signed)
 No heavy lifting > 10 lbs x 8 weeks   No driving x 4 weeks   Apply TED hose prior to discharge   TED hose x 2 weeks   No sex x 2 weeks   OK to shower   Wash incision daily with mild soap and water and ensure dry

## 2024-02-28 NOTE — Progress Notes (Signed)
 [facility]  Cardiac Surgery   Floor/Stepdown Progress Note      Jeremy Sexton, Jeremy Sexton  Date of Admission:  02/23/2024  Date of Birth:  Dec 23, 1955    Hospital Day:  LOS: 5 days   Post-op Day:  5 Days Post-Op, S/P  CABG x 5 with LIMA to LAD, SVG-PDA and PLB, SVG- Diagonal, SVG-OM  Date of Service:  02/28/2024    SUBJECTIVE:   Laying in bed resting comfortably. Has been getting up and oob to the bathroom by himself. No complaints     OBJECTIVE:  + BM   RA     Vital Signs:  Temp (24hrs) Max:37.1 C (98.7 F)      Temperature: 36.8 C (98.3 F) (02/28/24 0708)  BP (Non-Invasive): 117/65 (02/28/24 0708)  MAP (Non-Invasive): 79 mmHG (02/28/24 0708)  Heart Rate: 79 (02/28/24 0708)  Respiratory Rate: 13 (02/28/24 0708)  SpO2: 96 % (02/28/24 0708)    Base (Admission) Weight:  Weight: 92.1 kg (203 lb)  Weight:  Weight: 90.3 kg (199 lb)    Current Inpatient Medications:  acetaminophen  (TYLENOL ) tablet, 650 mg, Oral, Q4H PRN  Adult Nursing Driven Electrolyte Replacement Protocol Placeholder, , Other, Daily PRN  [Held by provider] amLODIPine  (NORVASC) tablet, 10 mg, Oral, QAM  aspirin  chewable tablet 81 mg, 81 mg, Oral, Daily  atorvastatin  (LIPITOR) tablet, 40 mg, Oral, NIGHTLY  bisacodyl  (DULCOLAX) rectal suppository, 10 mg, Rectal, Daily PRN  bisacodyl  (DULCOLAX) rectal suppository, 10 mg, Rectal, 2x/day  buPROPion  (WELLBUTRIN ) tablet, 75 mg, Oral, 2x/day  [Held by provider] carvedilol  (COREG ) tablet, 50 mg, Oral, 2x/day-Food  chlorhexidine  gluconate (PERIDEX ) 0.12% mouthwash, 15 mL, Topical, 2x/day  [Held by provider] cloNIDine  (CATAPRES ) tablet, 0.1 mg, Oral, 2x/day  clopidogrel  (PLAVIX ) 75 mg tablet, 75 mg, Oral, Daily  Correction/SSIP insulin  lispro 100 units/mL injection, 2-9 Units, Subcutaneous, Q4H  dextrose  (GLUTOSE) 40% oral gel, 15 g, Oral, Q15 Min PRN  dextrose  50% (0.5 g/mL) injection - syringe, 12.5 g, Intravenous, Q15 Min PRN  docusate sodium  (COLACE) capsule, 100 mg, Oral, 2x/day  furosemide  (LASIX )  tablet, 20 mg, Oral, 2x/day  gabapentin  (NEURONTIN ) capsule 400 mg, 400 mg, Oral, 3x/day  glucagon  (GLUCAGEN) injection 1 mg, 1 mg, IntraMUSCULAR, Once PRN  heparin  5,000 unit/mL injection, 5,000 Units, Subcutaneous, Q8HRS  ipratropium (ATROVENT ) 0.02% nebulizer solution, 0.5 mg, Nebulization, Q4H PRN  [Held by provider] lisinopril  (PRINIVIL ) tablet, 20 mg, Oral, QAM  magnesium  hydroxide (MILK OF MAGNESIA) 400mg  per 5mL oral liquid, 30 mL, Oral, Daily PRN  magnesium  hydroxide (MILK OF MAGNESIA) 400mg  per 5mL oral liquid, 15 mL, Oral, Daily  metFORMIN  (GLUCOPHAGE ) tablet, 500 mg, Oral, 2x/day-Food  metoprolol  tartrate (LOPRESSOR ) tablet, 25 mg, Oral, Q12H  NS flush syringe, 3 mL, Intracatheter, Q8HRS  NS flush syringe, 3 mL, Intracatheter, Q1H PRN  ondansetron  (ZOFRAN ) 2 mg/mL injection, 4 mg, Intravenous, Q8H PRN  oxyCODONE  (ROXICODONE ) immediate release tablet, 5 mg, Oral, Q4H PRN  oxyCODONE  (ROXICODONE ) immediate release tablet, 10 mg, Oral, Q4H PRN  potassium chloride  (K-DUR) extended release tablet, 10 mEq, Oral, 2x/day-Food  tamsulosin  (FLOMAX ) capsule, 0.4 mg, Oral, Daily after Dinner  traZODone  (DESYREL ) tablet, 50 mg, Oral, NIGHTLY      Appropriate Home Meds restarted:  Yes    I/O:  I/O last 24 hours to current time:    Intake/Output Summary (Last 24 hours) at 02/28/2024 1001  Last data filed at 02/27/2024 2300  Gross per 24 hour   Intake 20 ml   Output 1450 ml   Net -1430  ml     I/O last 3 completed shifts:  In: 20 [I.V.:20]  Out: 1450 [Urine:1450]    Nutrition/Diet:  DIET CARDIAC (2GM NA, LOW FAT, LOWCHOL)      Labs:  Reviewed:  Lab Results Today:    Results for orders placed or performed during the hospital encounter of 02/23/24 (from the past 24 hours)   POC BLOOD GLUCOSE (RESULTS)   Result Value Ref Range    GLUCOSE, POC 163 (H) 70 - 100 mg/dl   POC BLOOD GLUCOSE (RESULTS)   Result Value Ref Range    GLUCOSE, POC 159 (H) 70 - 100 mg/dl   POC BLOOD GLUCOSE (RESULTS)   Result Value Ref Range    GLUCOSE, POC  164 (H) 70 - 100 mg/dl   POC BLOOD GLUCOSE (RESULTS)   Result Value Ref Range    GLUCOSE, POC 113 (H) 70 - 100 mg/dl   ELECTROLYTES   Result Value Ref Range    SODIUM 135 133 - 144 mmol/L    POTASSIUM 4.1 3.2 - 5.0 mmol/L    CHLORIDE 105 96 - 106 mmol/L    CO2 TOTAL 23 22 - 30 mmol/L    ANION GAP 7 7 - 18 mmol/L   BUN   Result Value Ref Range    BUN 26 (H) 8 - 23 mg/dL   CREATININE   Result Value Ref Range    CREATININE 0.87 0.70 - 1.20 mg/dL    ESTIMATED GFR >09 >09 mL/min/1.68m^2   MAGNESIUM    Result Value Ref Range    MAGNESIUM  2.7 (H) 1.6 - 2.4 mg/dL   CBC WITH DIFF   Result Value Ref Range    WBC 6.8 3.7 - 11.0 x10^3/uL    RBC 2.91 (L) 4.50 - 6.10 x10^6/uL    HGB 7.8 (L) 13.4 - 17.5 g/dL    HCT 74.9 (L) 61.0 - 52.0 %    MCV 85.9 78.0 - 100.0 fL    MCH 26.8 26.0 - 32.0 pg    MCHC 31.2 31.0 - 35.5 g/dL    RDW-CV 84.2 (H) 88.4 - 15.5 %    PLATELETS 143 (L) 150 - 400 x10^3/uL    MPV 10.8 8.7 - 12.5 fL    NEUTROPHIL % 57.1 %    LYMPHOCYTE % 19.0 %    MONOCYTE % 13.8 %    EOSINOPHIL % 9.4 %    BASOPHIL % 0.3 %    NEUTROPHIL # 3.87 1.50 - 7.70 x10^3/uL    LYMPHOCYTE # 1.29 1.00 - 4.80 x10^3/uL    MONOCYTE # 0.94 0.20 - 1.10 x10^3/uL    EOSINOPHIL # 0.64 (H) <=0.50 x10^3/uL    BASOPHIL # <0.10 <=0.20 x10^3/uL    IMMATURE GRANULOCYTE % 0.4 0.0 - 1.0 %    IMMATURE GRANULOCYTE # <0.10 <0.10 x10^3/uL   POC BLOOD GLUCOSE (RESULTS)   Result Value Ref Range    GLUCOSE, POC 157 (H) 70 - 100 mg/dl       Radiology:    Reviewed:  IMPRESSION:     1.Minimal residual right basilar atelectasis. Cardiac enlargement. Lungs otherwise well aerated.    Microbiology:  pre-op urine negative     Physical Exam:    Constitutional:  appears in good health, comfortable  Respiratory:  Breathing nonlabored, Clear to auscultation bilaterally.   Cardiovascular:  regular rate and rhythm, S1, S2 normal, no murmur, click, rub or gallop  Gastrointestinal:  non-distended, Soft, non-tender  Musculoskeletal:  Head atraumatic and  normocephalic  Integumentary:  Skin warm and dry  Neurologic:  Grossly normal. Alert and oriented x3  Psychiatric:  Normal affect, behavior, memory, thought content, judgement, and speech.    ASSESSMENT/PLAN:    CXR stable   Podiatrist, Dr. Steen for dressing changes every other day- prisma dry gauze dressing   Continue with diuresis   OOB to chair/ambulation   Aggressive IS/pulmonary toilet   Subq heparin  for DVT ppx   Asa/plavix /lipitor/metoprolol    Plan for discharge home today     Problem List:  Active Hospital Problems    Diagnosis    Primary Problem: S/P CABG (coronary artery bypass graft)    Nonhealing nonsurgical wound with fat layer exposed    Cardiac insufficiency following cardiac surgery    Lactic acidosis    Hypovolemic shock (CMS HCC)       PT/OT: Yes  DVT RISK FACTORS HAVE BEN ASSESSED AND PROPHYLAXIS ORDERED (SEE RUBYONLINE - REFERENCE TOOLS - MD, DVT PROPHY OR POCKET CARD):  YES  Disposition Planning: Home discharge  and Home Health     Duwaine Devonshire, APRN,AGACNP-BC 02/28/2024 10:01

## 2024-02-28 NOTE — Progress Notes (Signed)
  Medicine [facility]  Progress Note    [name]  Date of service: 02/28/2024   Date of Admission:  02/23/2024    Hospital Day:  LOS: 5 days     Chief complaint: hopeful to go home    Subjective:  Jeremy Sexton is a 68 y.o., White male who presents with a known history of coronary artery disease underwent left heart catheterization by Dr. Neomi that revealed multivessel coronary disease.  He was referred to Dr. Skipper for coronary artery bypass graft.  He underwent procedure today CABG x 5 and was taken to the open heart recovery intubated on IV vasopressors.  We have been asked to see for critical care management.  He is currently intubated I will give review of systems or HPI     7/30- up to chair. Breathing is stable. Pulling on IS.     7/31: up to chair today. 2L NC - Cr. Bumped up, having urinary retention issues. Was given fluid bolus, for foley placement today.      8/1: 2L NC - feels better. AKI improved. Foley in place. Good output. To DC tubes and wires today.      8/2: room air. Up to chair. Tubes and wires DC yesterday. Tx out of icu today     8/3: room air. Hopeful to go home today.     ROS  Gen: No fever, aches, chills  HEENT: No headache ,nasal congestion, sore throat  Lungs: +mild SOB, cough  Heart: No chest pain, palpitations, syncope  Skin: No rashes, lesions, pruritus    Vital Signs:  Temp  Avg: 36.9 C (98.4 F)  Min: 36.7 C (98.1 F)  Max: 37 C (98.6 F)    Pulse  Avg: 80.6  Min: 73  Max: 92 BP  Min: 100/63  Max: 125/63   Resp  Avg: 14.4  Min: 11  Max: 24 SpO2  Avg: 95.7 %  Min: 94 %  Max: 98 %          Input/Output    Intake/Output Summary (Last 24 hours) at 02/28/2024 1146  Last data filed at 02/27/2024 2300  Gross per 24 hour   Intake 20 ml   Output 1450 ml   Net -1430 ml    I/O last shift:  No intake/output data recorded.   acetaminophen  (TYLENOL ) tablet, 650 mg, Oral, Q4H PRN  Adult Nursing Driven Electrolyte Replacement Protocol Placeholder, , Other, Daily PRN  [Held by  provider] amLODIPine  (NORVASC) tablet, 10 mg, Oral, QAM  aspirin  chewable tablet 81 mg, 81 mg, Oral, Daily  atorvastatin  (LIPITOR) tablet, 40 mg, Oral, NIGHTLY  bisacodyl  (DULCOLAX) rectal suppository, 10 mg, Rectal, Daily PRN  buPROPion  (WELLBUTRIN ) tablet, 75 mg, Oral, 2x/day  [Held by provider] carvedilol  (COREG ) tablet, 50 mg, Oral, 2x/day-Food  chlorhexidine  gluconate (PERIDEX ) 0.12% mouthwash, 15 mL, Topical, 2x/day  [Held by provider] cloNIDine  (CATAPRES ) tablet, 0.1 mg, Oral, 2x/day  clopidogrel  (PLAVIX ) 75 mg tablet, 75 mg, Oral, Daily  Correction/SSIP insulin  lispro 100 units/mL injection, 2-9 Units, Subcutaneous, Q4H  dextrose  (GLUTOSE) 40% oral gel, 15 g, Oral, Q15 Min PRN  dextrose  50% (0.5 g/mL) injection - syringe, 12.5 g, Intravenous, Q15 Min PRN  docusate sodium  (COLACE) capsule, 100 mg, Oral, 2x/day  furosemide  (LASIX ) tablet, 20 mg, Oral, 2x/day  gabapentin  (NEURONTIN ) capsule 400 mg, 400 mg, Oral, 3x/day  glucagon  (GLUCAGEN) injection 1 mg, 1 mg, IntraMUSCULAR, Once PRN  heparin  5,000 unit/mL injection, 5,000 Units, Subcutaneous, Q8HRS  ipratropium (ATROVENT )  0.02% nebulizer solution, 0.5 mg, Nebulization, Q4H PRN  [Held by provider] lisinopril  (PRINIVIL ) tablet, 20 mg, Oral, QAM  magnesium  hydroxide (MILK OF MAGNESIA) 400mg  per 5mL oral liquid, 30 mL, Oral, Daily PRN  metFORMIN  (GLUCOPHAGE ) tablet, 500 mg, Oral, 2x/day-Food  metoprolol  tartrate (LOPRESSOR ) tablet, 25 mg, Oral, Q12H  NS flush syringe, 3 mL, Intracatheter, Q8HRS  NS flush syringe, 3 mL, Intracatheter, Q1H PRN  ondansetron  (ZOFRAN ) 2 mg/mL injection, 4 mg, Intravenous, Q8H PRN  oxyCODONE  (ROXICODONE ) immediate release tablet, 5 mg, Oral, Q4H PRN  oxyCODONE  (ROXICODONE ) immediate release tablet, 10 mg, Oral, Q4H PRN  potassium chloride  (K-DUR) extended release tablet, 10 mEq, Oral, 2x/day-Food  tamsulosin  (FLOMAX ) capsule, 0.4 mg, Oral, Daily after Dinner  traZODone  (DESYREL ) tablet, 50 mg, Oral, NIGHTLY        Physical  Exam:  Constitutional: awake, alert, appears stated age. No acute distress noted.    Eyes: Pupils equal and round, reactive to light and accomodation. , Sclera non-icteric. , normal findings: lids and lashes normal and conjunctivae and sclerae normal  ENT: Head atraumatic and normocephalic, External Ears: normal pinnae shape and position and no signs of inflammation, Nose without erythema. , Mouth mucous membranes moist. , No oral lesions.   Neck: no thyromegaly or lymphadenopathy, supple, symmetrical, trachea midline, and no adenopathy.   Respiratory:   Clear with diminished bases, chest tube in place  Cardiovascular: regular rate and rhythm, S1, S2 normal.    Gastrointestinal: non-distended, Soft, non-tender, Bowel sounds normal, No hepatosplenomegaly, No masses.    Musculoskeletal:  moves all 4 extremities , no edema noted.   Integumentary:  Skin warm and dry and Skin color, texture, turgor normal. No rashes or lesions  Neurologic: Grossly normal.  Following commands, CN II - XII grossly intact , No tremor  Lymphatic/Immunologic/Hematologic: No lymphadenopathy  Psychiatric:  Pleasant affect.  Insight and judgment seem intact.    Labs:     Results for orders placed or performed during the hospital encounter of 02/23/24 (from the past 24 hours)   POC BLOOD GLUCOSE (RESULTS)   Result Value Ref Range    GLUCOSE, POC 163 (H) 70 - 100 mg/dl   POC BLOOD GLUCOSE (RESULTS)   Result Value Ref Range    GLUCOSE, POC 159 (H) 70 - 100 mg/dl   POC BLOOD GLUCOSE (RESULTS)   Result Value Ref Range    GLUCOSE, POC 164 (H) 70 - 100 mg/dl   POC BLOOD GLUCOSE (RESULTS)   Result Value Ref Range    GLUCOSE, POC 113 (H) 70 - 100 mg/dl   ELECTROLYTES   Result Value Ref Range    SODIUM 135 133 - 144 mmol/L    POTASSIUM 4.1 3.2 - 5.0 mmol/L    CHLORIDE 105 96 - 106 mmol/L    CO2 TOTAL 23 22 - 30 mmol/L    ANION GAP 7 7 - 18 mmol/L   BUN   Result Value Ref Range    BUN 26 (H) 8 - 23 mg/dL   CREATININE   Result Value Ref Range    CREATININE  0.87 0.70 - 1.20 mg/dL    ESTIMATED GFR >09 >09 mL/min/1.30m^2   MAGNESIUM    Result Value Ref Range    MAGNESIUM  2.7 (H) 1.6 - 2.4 mg/dL   CBC WITH DIFF   Result Value Ref Range    WBC 6.8 3.7 - 11.0 x10^3/uL    RBC 2.91 (L) 4.50 - 6.10 x10^6/uL    HGB 7.8 (L) 13.4 - 17.5 g/dL  HCT 25.0 (L) 38.9 - 52.0 %    MCV 85.9 78.0 - 100.0 fL    MCH 26.8 26.0 - 32.0 pg    MCHC 31.2 31.0 - 35.5 g/dL    RDW-CV 84.2 (H) 88.4 - 15.5 %    PLATELETS 143 (L) 150 - 400 x10^3/uL    MPV 10.8 8.7 - 12.5 fL    NEUTROPHIL % 57.1 %    LYMPHOCYTE % 19.0 %    MONOCYTE % 13.8 %    EOSINOPHIL % 9.4 %    BASOPHIL % 0.3 %    NEUTROPHIL # 3.87 1.50 - 7.70 x10^3/uL    LYMPHOCYTE # 1.29 1.00 - 4.80 x10^3/uL    MONOCYTE # 0.94 0.20 - 1.10 x10^3/uL    EOSINOPHIL # 0.64 (H) <=0.50 x10^3/uL    BASOPHIL # <0.10 <=0.20 x10^3/uL    IMMATURE GRANULOCYTE % 0.4 0.0 - 1.0 %    IMMATURE GRANULOCYTE # <0.10 <0.10 x10^3/uL   POC BLOOD GLUCOSE (RESULTS)   Result Value Ref Range    GLUCOSE, POC 157 (H) 70 - 100 mg/dl   [facility]   Result Value Ref Range    Supplier Name [facility]     Supplier Phone [phone]     Delivery Status Supplier Submitted     Delivery Note      Requested Delivery Date 02/28/2024     Item Description 3 in 1 Commode     Item Description Glenys Finder, Adult       Radiology:      Results for orders placed or performed during the hospital encounter of 02/23/24 (from the past 2160 hours)   XR CHEST AP MOBILE     Status: None    Narrative    Haydan W Loge    XR AP MOBILE CHEST  02/23/2024 1:10 PM    INDICATION:  Immediate Post-Op Exam - evaluation for effusion  evaluation for effusion    TECHNIQUE:  Portable frontal view obtained on 1 image(s).    COMPARISON:  None available.    FINDINGS:    Impression    See impression:      IMPRESSION:    CABG. ET and NG tubes in satisfactory position.   Tubes or drains project over the left mid chest and upper mid abdomen.  Right IJ central venous catheter sheath.      Heart size is normal.  No  focal consolidation or effusion.  No pneumothorax or evidence of pneumomediastinum.        Radiologist location ID: [id number]     XR AP MOBILE CHEST     Status: None    Narrative    Draylen W Denapoli    XR AP MOBILE CHEST performed on 02/24/2024 4:38 AM.    INDICATION:  Status-Post Cardiothoracic Surgery - evaluation for effusion   Additional History:  follow up    TECHNIQUE:  Single portable frontal view of the chest.  1 views/1 images submitted for interpretation.    COMPARISON:  Yesterday    ___________________________________  FINDINGS:     * Interval extubation and NG tube removal. Similar right central line. Persistent but altered position of the subxiphoid catheters.  * No pneumothorax.  * Similar cardiomediastinal silhouette and aeration.    ___________________________________    Impression    No acute cardiopulmonary findings. Discontinued ETT and NGT. Altered subxiphoid catheter position.         Radiologist location ID: [id number]     XR KUB  Status: None    Narrative    Cru W Thorner    XR KUB performed on 02/24/2024 11:55 AM.    INDICATION:  evaluate bowel r/o ileus   Additional History:  adominal distension, evaluate bowel r/o ileus    TECHNIQUE:  Single supine KUB view of the abdomen.  1 views/1 images submitted for interpretation.    COMPARISON:  None available.  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    Nonspecific bowel gas pattern. No renal calcifications.  ___________________________________    Impression    Nonspecific bowel gas pattern.      Radiologist location ID: [id number]     XR AP MOBILE CHEST     Status: None    Narrative    Ostin W Victorino    XR AP MOBILE CHEST performed on 02/25/2024 4:55 AM.    INDICATION:  Status-Post Cardiothoracic Surgery - evaluation for effusion   Additional History:  Status-Post Cardiothoracic Surgery - evaluation for effusion    TECHNIQUE:  Single portable frontal view of the chest.  1  views/1 images submitted for interpretation.    COMPARISON:  Yesterday    ___________________________________  FINDINGS:     * Similar support devices.  * Similar cardiomediastinal silhouette.   * No pneumothorax or large pleural effusion.   * Similar streaky perihilar and right basal atelectasis. No pulmonary edema or new consolidation.    ___________________________________    Impression    No significant interval change.           Radiologist location ID: [id number]     XR AP MOBILE CHEST     Status: None    Narrative    Kevyn W Bocek    XR AP MOBILE CHEST performed on 02/26/2024 5:08 AM.    INDICATION:  Status-Post Cardiothoracic Surgery - evaluation for effusion   Additional History:  follow up    TECHNIQUE:  Single portable frontal view of the chest.  1 views/1 images submitted for interpretation.    COMPARISON:  02/25/2024.    ___________________________________  FINDINGS:     * Discontinued right central line. Persistent subxiphoid catheters.  * Similar cardiomediastinal silhouette.   * No pneumothorax or large pleural effusion.   * Similar atelectasis and mild pulmonary vascular congestion.    ___________________________________    Impression    Discontinued right central line. Similar aeration.         Radiologist location ID: [id number]     XR AP MOBILE CHEST     Status: None    Narrative    Trevaris W Lueth    XR AP MOBILE CHEST performed on 02/26/2024 9:00 PM.    INDICATION:  chest tube removal   Additional History:  Chest tube removal    TECHNIQUE:  Single portable frontal view of the chest.  1 views/1 images submitted for interpretation.    COMPARISON:  Earlier today.    Number of Views: 1 views/1 images.    FINDINGS:  Removal of left thoracostomy tube.  Mediastinal contours are unchanged. Sternotomy with CABG.    No evidence of pneumothorax. Perihilar atelectasis. Unchanged small effusions. No new airspace opacity or edema.      Impression    As above.    .  .  .  s/d/g          Radiologist location ID:  [id number]     XR AP MOBILE CHEST     Status: None    Narrative  Filimon W Guadarrama    XR AP MOBILE CHEST performed on 02/27/2024 5:30 AM.    INDICATION:  Evaluation for effusion, upright inspiratory view   Additional History:  follow up    TECHNIQUE:  Single portable frontal view of the chest.  1 views/1 images submitted for interpretation.    COMPARISON:  02/26/2024  ___________________________________  FINDINGS:    Sternotomy wires. Unchanged mediastinal contours.    Decreasing atelectasis in the lungs. No new focal opacities. A likely trace bilateral pleural effusions. No visible pneumothorax.    No acute abnormality of the visualized bones.  ___________________________________    Impression    Decreasing atelectasis in the lungs but otherwise unchanged appearance of the chest compared to 02/26/2024 with likely trace bilateral pleural effusions.        Radiologist location ID: [id number]     XR AP MOBILE CHEST     Status: None    Narrative    Rydan W Kindley    XR AP MOBILE CHEST performed on 02/28/2024 7:36 AM.    INDICATION:  Evaluation for effusion, upright inspiratory view   Additional History:  Evaluation for effusion    TECHNIQUE:  Single portable frontal view of the chest.  1 views/1 images submitted for interpretation.    COMPARISON:  Portable chest February 27, 2024  ___________________________________  FINDINGS:    Moderate cardiac enlargement. Prior coronary artery bypass graft surgery.    Pulmonary vascularity normal. Minimal right basilar atelectasis. Lungs otherwise well aerated.  ___________________________________    Impression    1. Minimal residual right basilar atelectasis. Cardiac enlargement. Lungs otherwise well aerated.        Radiologist location ID: [id number]        Consults:   Assessment/ Plan:   Active Hospital Problems    Diagnosis    Primary Problem: S/P CABG (coronary artery bypass graft)    Nonhealing nonsurgical wound with fat layer exposed     Patient seen and examined  Chart  reviewed  Tele shows sinus  CXR reviewed, mild atelectasis  67y/o s/p CABG with h/o DM and PVD. Lifelong non smoker. No h/o lung disease.  Hopeful to d/c home today  Continue with IS and ambulation at home  No O2 requirement  Ok to DC from pulmonary perspective  Will discuss with my attending       Larraine LITTIE Marc, APRN-FNP-BC,

## 2024-02-28 NOTE — Care Management Notes (Signed)
 Patient requested FWW/3n1, no preference for DME patient out from [place], referral sent to [employer] to be delivered to home.

## 2024-02-28 NOTE — Care Management Notes (Signed)
 Care Management spoke with both patient and spouse at bedside, notifying them on DME company and contact number. Choice form obtained and placed on chart.

## 2024-02-28 NOTE — Discharge Summary (Signed)
 [facility] Medicine [facility]  DISCHARGE SUMMARY    PATIENT NAME:  Jeremy Sexton, Jeremy Sexton  MRN:  [id number]  DOB:  18-Jun-1956    ENCOUNTER DATE:  02/23/2024  INPATIENT ADMISSION DATE: 02/23/2024  DISCHARGE DATE:  02/28/2024    ATTENDING PHYSICIAN: Skipper Ege, MD  SERVICE: [facility] GENERAL SURGERY  PRIMARY CARE PHYSICIAN: Hiep Le, DO       No lay caregiver identified.    PRIMARY DISCHARGE DIAGNOSIS: S/P CABG (coronary artery bypass graft)  Active Hospital Problems    Diagnosis Date Noted    Principal Problem: S/P CABG (coronary artery bypass graft) [Z95.1] 02/23/2024    Nonhealing nonsurgical wound with fat layer exposed [T14.8XXA] 02/25/2024      Resolved Hospital Problems    Diagnosis     Cardiac insufficiency following cardiac surgery [I97.110]     Lactic acidosis [E87.20]     Hypovolemic shock (CMS HCC) [R57.1]      Active Non-Hospital Problems    Diagnosis Date Noted    PVD (peripheral vascular disease) with claudication (CMS HCC) 01/19/2024    CAD (coronary artery disease) 12/09/2023    Abnormal stress test 11/30/2023    Cellulitis 01/20/2023    Cellulitis of right foot 01/20/2023    Type 2 diabetes mellitus 01/20/2023    Diabetes mellitus 01/15/2023    Hyperlipidemia 01/15/2023    Hypertensive disorder 01/15/2023    Low back pain 01/15/2023             Current Discharge Medication List        PAUSE taking these medications        Details   amLODIPine  10 mg Tablet  Wait to take this until your doctor or other care provider tells you to start again.  Commonly known as: NORVASC   10 mg, EVERY MORNING  Refills: 0     carvediloL  25 mg Tablet  Wait to take this until your doctor or other care provider tells you to start again.  Commonly known as: COREG    50 mg, Oral, 2 TIMES DAILY WITH FOOD  Qty: 360 Tablet  Refills: 0     cloNIDine  HCL 0.1 mg Tablet  Wait to take this until your doctor or other care provider tells you to start again.  Commonly known as: CATAPRES    0.1 mg, 2 TIMES DAILY  Refills: 0     lisinopriL  20 mg  Tablet  Wait to take this until your doctor or other care provider tells you to start again.  Commonly known as: PRINIVIL   What changed: when to take this   20 mg, Oral, Daily  Qty: 90 Tablet  Refills: 0            START taking these medications.        Details   clopidogreL  75 mg Tablet  Commonly known as: PLAVIX   Start taking on: February 29, 2024   75 mg, Oral, Daily  Qty: 90 Tablet  Refills: 3     HYDROcodone -acetaminophen  5-325 mg Tablet  Commonly known as: NORCO   1 Tablet, Oral, EVERY 6 HOURS PRN  Qty: 9 Tablet  Refills: 0     metoprolol  tartrate 25 mg Tablet  Commonly known as: LOPRESSOR    25 mg, Oral, EVERY 12 HOURS  Qty: 60 Tablet  Refills: 3     tamsulosin  0.4 mg Capsule  Commonly known as: FLOMAX   Start taking on: February 29, 2024   0.4 mg, Oral, EVERY EVENING AFTER DINNER  Qty: 30 Capsule  Refills:  0            CONTINUE these medications which have CHANGED during your visit.        Details   atorvastatin  40 mg Tablet  Commonly known as: LIPITOR  What changed: when to take this   40 mg, Oral, EVERY EVENING  Qty: 90 Tablet  Refills: 0            CONTINUE these medications - NO CHANGES were made during your visit.        Details   aspirin  81 mg Tablet, Delayed Release (E.C.)  Commonly known as: ECOTRIN   81 mg, EVERY MORNING  Refills: 0     buPROPion  75 mg Tablet  Commonly known as: WELLBUTRIN    75 mg, 2 TIMES DAILY  Refills: 0     furosemide  20 mg Tablet  Commonly known as: LASIX    20 mg, EVERY MORNING  Refills: 0     gabapentin  400 mg Capsule  Commonly known as: NEURONTIN    400 mg, 3 TIMES DAILY  Refills: 0     Ibuprofen 200 mg Tablet  Commonly known as: MOTRIN   600 mg, 4 TIMES DAILY PRN  Refills: 0     metFORMIN  500 mg Tablet  Commonly known as: GLUCOPHAGE    500 mg, 2 TIMES DAILY WITH FOOD  Refills: 0     Pepcid  40 mg Tablet  Generic drug: famotidine    Take 1 Tablet (40 mg total) by mouth Twice daily  Refills: 0     potassium chloride  10 mEq Tab Sust.Rel. Particle/Crystal  Commonly known as: K-DUR   10 mEq,  EVERY MORNING  Refills: 0     traZODone  50 mg Tablet  Commonly known as: DESYREL    50 mg, NIGHTLY  Refills: 0            ASK your doctor about these medications.        Details   linezolid  600 mg Tablet  Commonly known as: ZYVOX   Ask about: Should I take this medication?   600 mg, Oral, 2 TIMES DAILY  Qty: 28 Tablet  Refills: 0            Discharge med list refreshed?  YES     CARDIOVASCULAR MEDICATIONS:  ASA: Yes  Statin: Yes  Antiplatelets (Plavix , Brilinta, Effient): Yes  Anticoagulants (Coumadin, Xarelto, Eliquis): No. Reason: not indicated  Beta Blocker: Yes  ACEI/ARB/ARNI: No not indicated   MRA: No not indicated   SGLT2i: No not indicated        Allergies[1]    HOSPITAL PROCEDURE(S):      XR CHEST PA AND LATERAL    Please schedule with return to [facility] clinic visit.     Reason for Exam: s/p CABG      Refer to Home Health - EXTERNAL     DISCHARGE INSTRUCTION - ACTIVITY    We suggest a cautious, yet progressive plan to safely regain your normal body and life functions. In time, you should be able to do your regular routine tasks, return to work, and take part in recreational activities. Here are a few tips:  Get up and get dressed each morning. Please do not stay in bed. Do wear comfortable clothes each day, break up long tasks into shorter parts, and space them over the day and stop your tasks before you get tired, if you do too much, you will likely be tired the next day and need to rest, which will slow recovery, balance your  activity with rest times. Your body may give you signals to rest, these include symptoms such as shortness of breath, fatigue, dizziness, and pain, rest when possible or needed. If you need to climb stairs, we suggest going slowly at first. Always remember that it takes more energy to climb a flight of steps then to walk on a level surface. If you start to become tired or have shortness of breath, stop, rest for a few minutes,   and   then continue. You should only use a railing for your  balance, please do not pull yourself up the stairs as the strain can damage your incision and put excess pressure on your body.     Activity: GRADUALLY INCREASE ACTIVITY AS TOLERATED    Activity: NO LIFTING OVER 10 POUNDS FOR 2 WEEKS    Activity: NO VIGOROUS/STRENUOUS ACTIVITY      DISCHARGE INSTRUCTION - DRIVING    You will not be cleared to safely drive a car until after your visit with your surgeon, this visit is often scheduled four weeks after your surgery. No driving while taking Narcotic (pain) medications.  If you were to be involved in a car accident, you would hurt your sternum(breast bone) or other areas that have undergone surgical procedures. When you are a passenger in a car, we suggest you ride in the back seat, if possible. If you are riding in the front seat, move as far back as possible to increase leg room, and use a pillow between your chest and the seat belt for added comfort and to avoid any irritation on your incision. We advise against taking long trips without a doctors approval, and when approved be sure to allow ample time for stops to walk and stretch your legs and arms.     DISCHARGE INSTRUCTION - INCISION/WOUND CARE    Please follow these steps to care for your incision(s): breathe in through your nose as you raise your arms during activity, breathe out through your mouth as you lower your arms. Never hold your breath, you may raise your arms over your head to brush or shampoo your hair. Be careful when reaching. The breastbone and the muscles around it may be very sore, if this was the approach for your surgery, do not lift anything heavier than 5 pounds for 4 weeks after surgery,   (if your incision is located on either side of your chest, you may not lift anything heavier than 10 pounds for 2 weeks after your surgery),  you should not push or pull with your arms, especially when rising from a bed, assistive devices, such as canes or walkers, can be used only for balance. Do not place  your full weight on any of these devices until the incision is fully healed, it is very important to keep your incisions clean and dry. Follow these guidelines: shower daily, pat dry. Do not take a tub bath for 4   weeks or until your surgeon says you may. Wash your incisions with an antimicrobial soap and water . Always use a clean cloth. Do not put any creams, lotions, or antibiotic ointments on the incisions. Keep your legs raised when sitting for more than 15 minutes, and remember to stretch. Do not wear any tight clothing that may rub against your incisions, causing irritation.     Instructions for incision/wound care: Wound Care as Instructed      DISCHARGE INSTRUCTION - IF THE FOLLOWING INFORMATION APPLIES OR IF YOU HAVE ANY QUESTIONS, PLEASE CONTACT  THE SURGEON'S OFFICE  AT [phone].    Call for:  Increased warmth in the skin around an incision, redness that spreads out more than one inch from incision edges, increase of swelling, tightness, or pain around an incision, large amount of clear or pinkish drainage, sudden increase in the amount of drainage, white, yellow, or green drainage with odor coming from an incision, fever higher than 101.0 F (38.3 C), chills or temperature of 99.0 F (37.2 C) to 100.9 F (38.27 C) for more than 3 days.  Continued or severe sadness, lightheadedness/dizziness, increased shortness of breath, burning when passing urine, severe calf pain, new severe pain in your chest, heart rate greater than 110 beats per minute or less than 50 beats per minute.     DISCHARGE INSTRUCTION - REFER TO HEART SURGERY BOOKLET    You have been provided with the comprehensive Heart Surgery: Your Heart -> Our Focus booklet.  Please continue to refer to this during your recovery process.  It not only educates you on the discharge instructions provided to you today but also additional instructions for caring for yourself at home, heart healthy way of life, heart healthy food plan and other items to  remember.     DISCHARGE INSTRUCTION - DIET - Cardiac Diet 1800 cal ADA     Diet: CARDIAC DIET 1800 cal ADA     CARDIAC REHAB PHASE II    Individualized education will be given based on the patient's diagnosis and needs.    Therapist/Nurse may place secondary order for Daily Documentation orders if needed.            Indication for Therapy S/P CABG    Method to Determine THR for Program Duration TO BE DETERMINED BY THE CARDIAC REHAB STAFF    Exercise Intensity or MET Level TO BE DETERMINED BY CARDIAC REHAB STAFF      ECG 12 LEAD    S/P CARDIAC SURGERY    SCHEDULE WITH FOLLOW-UP IN CLINIC           CARDIAC REHAB PHASE II    Individualized education will be given based on the patient's diagnosis and needs.    Therapist/Nurse may place secondary order for Daily Documentation orders if needed.            Indication for Therapy S/P CABG    Method to Determine THR for Program Duration TO BE DETERMINED BY THE CARDIAC REHAB STAFF    Exercise Intensity or MET Level TO BE DETERMINED BY CARDIAC REHAB STAFF      FOLLOW-UP: [facility] - THORACIC SURGERY - MEDICAL OFFICE BLDG - [place], [place]    With CXR and EKG prior   [facility] [place] [phone]     Reason for visit: POST-OP VISIT    Follow-up in: 3 WEEKS    Provider: dr. Skipper      DME - [address] [place]; Walker Accessories: None    Please note - If patient is 300 lbs or greater please order bariatric or heavy duty items.     Patient has mobility limits that significantly impairs ability to participate in one or more mobility related ADL's (MRADL's): Yes    Moblity Limitations: Pt at heightened risk of injury r/t attempts to fulfill MRADL's & can safely use walker which resolves issue    Walker Type: Front Wheeled    Furniture conservator/restorer: None    Freedom of Choice: I have informed patient of their freedom of choice with respect  to DME providers      DME - BEDSIDE COMMODE    A standard commode is covered when the patient is physically incapable of utilizing  regular toilet facilities.  An extra wide/heavy duty commode chair is covered for a patient who weighs 300 pounds or more.     I certify that a bedside commode is necessary due to Patient is confined to one level of home and there is no toilet on that level    Bedside Commode Type Standard Bedside Commode    Freedom of Choice: I have informed patient of their freedom of choice with respect to DME providers      Orders Placed This Encounter   Procedures    BEDSIDE  MISC PROCEDURE     Surgical/Procedural Cases on this Admission       Case IDs Date Procedure Surgeon Location Status    [id number] 02/23/24 BYPASS GRAFT CORONARY ARTERY/ CABG/ EVHECHOCARDIOGRAM TRANSESOPHAGEAL Skipper Ege, MD [facility] OR [facility]IN [facility]          REASON FOR HOSPITALIZATION AND HOSPITAL COURSE   BRIEF HPI:  Jeremy Sexton is a 68 y.o., White male who presents today with an abnormal heart catheterization that was conducted by Dr. Neomi. The patient has had an increased shortness of breath and weakness/fatigue over the last months. He sees the cardiologist, Dr. Sheralyn in [place], and he ordered a stress test and echo on patient. Those results were abnormal which led to him being referred to Dr. Neomi for catheterization.The heart catheterization showed multivessel coronary disease with a chronically occluded LAD. The patient has a history of HTN, HLD, T2D, and peripheral neuropathy. 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 Patient follows with the surgeon for care. He has no history of tobacco use or alcohol use. His father CAD history with a massive MI. Patient is completely independent with ADLs. Patient is referred here today for evaluation for CABG with Dr. Skipper.     BRIEF HOSPITAL NARRATIVE:  Admitted to the hospital to undergo elective CABG with Dr. Skipper.  He successfully received CABG x5 with LIMA to LAD, SVG to D1, SVG to OM1, and SVG to PDA, RPLB on 02/23/24.  Postoperatively he was transferred to the  open heart recovery unit in stable condition.  He was later extubated without difficulty.  He was weaned from pressors, and invasive monitoring was removed.  He was transferred to ICU in stable condition.  He was noted to have some urinary retention postoperatively requiring replacement of Foley catheter.  He was started on Flomax  and was able to void after Foley catheter was removed. Temporary pacing wires and chest tubes were removed without difficulty. He has moved his bowels.  He has progressed well with ambulation.  He does have a nonhealing wound to his foot and is monitored and followed with this for regular dressing changes with podiatry.  Vital signs and labs have remained stable.  Home health has been arranged for discharge.  He will be discharged home in stable condition.  He is to take medications as prescribed.  He is to follow up with physicians as advised.  Encouraged to call the office in the interim with any questions or concerns.        TRANSITION/POST DISCHARGE CARE/PENDING TESTS/REFERRALS: CXR, EKG prior to follow-up appointment   CBC, BMP in 28 hours, 1 week and 2 weeks through [facility]     CONDITION ON DISCHARGE:  A. Ambulation: Full  ambulation  B. Self-care Ability: Complete  C. Cognitive Status Alert and Oriented x 3  D. Code status at discharge: Full code       LINES/DRAINS/WOUNDS AT DISCHARGE:   Patient Lines/Drains/Airways Status       Active Line / Dialysis Catheter / Dialysis Graft / Drain / Airway / Wound       Name Placement date Placement time Site Days    Peripheral IV Right Basilic  (medial side of arm) 02/23/24  0617  -- 5    Wound  Right Foot 01/20/23  1924  -- 403    Wound  Incision Right Foot 02/06/23  --  -- 387    Wound  Incision Right Foot 02/24/23  --  -- 369    Wound  Incision Right Foot 05/12/23  1131  -- 291    Wound  Incision Right Foot 06/12/23  --  -- 261    Wound  Incision Sternum 02/23/24  0749  -- 5    Wound  Incision Anterior;Left;Lower Leg 02/23/24  0749  -- 5                     DISCHARGE DISPOSITION:  Home discharge and Home Health    DISCHARGE INSTRUCTIONS:  Post-Discharge Follow Up Appointments       Follow up with Cardiothoracic Surgery, [facility]    Phone: [phone]    Where: [address] [place]  [place] [address]    Call Ward, Garnette, MD in 4 weeks    Phone: [phone]    Where: [facility]      Tuesday Mar 08, 2024    [facility] ABI W/PAD with [facility] VASC 2 at  2:00 PM    [facility] PERIPH ART DUP with [facility] VASC 2 at  3:00 PM    Return Patient Visit with Gwenda Garnette, MD at  3:15 PM      Monday Mar 21, 2024    Wound Care Visit with Steen Raisin, DPM at  9:45 AM      Heart & Vascular Imaging Services, Division [facility]  Division [facility] [place]  [address]  [place] [address]  [phone] Vascular Surgery, Division [facility]  Division [facility], [place]  [address]  [place] [address]  [phone] Wound Care, [facility] Office Building [facility] Building [place], [place]  [address] [place] [address]  [phone]             XR CHEST PA AND LATERAL    Please schedule with return to [facility] clinic visit.     Reason for Exam: s/p CABG      Refer to Home Health - EXTERNAL     DISCHARGE INSTRUCTION - ACTIVITY    We suggest a cautious, yet progressive plan to safely regain your normal body and life functions. In time, you should be able to do your regular routine tasks, return to work, and take part in recreational activities. Here are a few tips:  Get up and get dressed each morning. Please do not stay in bed. Do wear comfortable clothes each day, break up long tasks into shorter parts, and space them over the day and stop your tasks before you get tired, if you do too much, you will likely be tired the next day and need to  rest,  which will slow recovery, balance your activity with rest times. Your body may give you signals to rest, these include symptoms such as shortness of breath, fatigue, dizziness, and pain, rest when possible or needed. If you need to climb stairs, we suggest going slowly at first. Always remember that it takes more energy to climb a flight of steps then to walk on a level surface. If you start to become tired or have shortness of breath, stop, rest for a few minutes,   and   then continue. You should only use a railing for your balance, please do not pull yourself up the stairs as the strain can damage your incision and put excess pressure on your body.     Activity: GRADUALLY INCREASE ACTIVITY AS TOLERATED    Activity: NO LIFTING OVER 10 POUNDS FOR 2 WEEKS    Activity: NO VIGOROUS/STRENUOUS ACTIVITY      DISCHARGE INSTRUCTION - DRIVING    You will not be cleared to safely drive a car until after your visit with your surgeon, this visit is often scheduled four weeks after your surgery. No driving while taking Narcotic (pain) medications.  If you were to be involved in a car accident, you would hurt your sternum(breast bone) or other areas that have undergone surgical procedures. When you are a passenger in a car, we suggest you ride in the back seat, if possible. If you are riding in the front seat, move as far back as possible to increase leg room, and use a pillow between your chest and the seat belt for added comfort and to avoid any irritation on your incision. We advise against taking long trips without a doctors approval, and when approved be sure to allow ample time for stops to walk and stretch your legs and arms.     DISCHARGE INSTRUCTION - INCISION/WOUND CARE    Please follow these steps to care for your incision(s): breathe in through your nose as you raise your arms during activity, breathe out through your mouth as you lower your arms. Never hold your breath, you may raise your arms over your head  to brush or shampoo your hair. Be careful when reaching. The breastbone and the muscles around it may be very sore, if this was the approach for your surgery, do not lift anything heavier than 5 pounds for 4 weeks after surgery,   (if your incision is located on either side of your chest, you may not lift anything heavier than 10 pounds for 2 weeks after your surgery),  you should not push or pull with your arms, especially when rising from a bed, assistive devices, such as canes or walkers, can be used only for balance. Do not place your full weight on any of these devices until the incision is fully healed, it is very important to keep your incisions clean and dry. Follow these guidelines: shower daily, pat dry. Do not take a tub bath for 4   weeks or until your surgeon says you may. Wash your incisions with an antimicrobial soap and water . Always use a clean cloth. Do not put any creams, lotions, or antibiotic ointments on the incisions. Keep your legs raised when sitting for more than 15 minutes, and remember to stretch. Do not wear any tight clothing that may rub against your incisions, causing irritation.     Instructions for incision/wound care: Wound Care as Instructed      DISCHARGE INSTRUCTION - IF THE FOLLOWING INFORMATION APPLIES OR IF YOU  HAVE ANY QUESTIONS, PLEASE CONTACT THE SURGEON'S OFFICE  AT [phone].    Call for:  Increased warmth in the skin around an incision, redness that spreads out more than one inch from incision edges, increase of swelling, tightness, or pain around an incision, large amount of clear or pinkish drainage, sudden increase in the amount of drainage, white, yellow, or green drainage with odor coming from an incision, fever higher than 101.0 F (38.3 C), chills or temperature of 99.0 F (37.2 C) to 100.9 F (38.27 C) for more than 3 days.  Continued or severe sadness, lightheadedness/dizziness, increased shortness of breath, burning when passing urine, severe calf pain, new  severe pain in your chest, heart rate greater than 110 beats per minute or less than 50 beats per minute.     DISCHARGE INSTRUCTION - REFER TO HEART SURGERY BOOKLET    You have been provided with the comprehensive Heart Surgery: Your Heart -> Our Focus booklet.  Please continue to refer to this during your recovery process.  It not only educates you on the discharge instructions provided to you today but also additional instructions for caring for yourself at home, heart healthy way of life, heart healthy food plan and other items to remember.     DISCHARGE INSTRUCTION - DIET - Cardiac Diet 1800 cal ADA     Diet: CARDIAC DIET 1800 cal ADA     CARDIAC REHAB PHASE II    Individualized education will be given based on the patient's diagnosis and needs.    Therapist/Nurse may place secondary order for Daily Documentation orders if needed.            Indication for Therapy S/P CABG    Method to Determine THR for Program Duration TO BE DETERMINED BY THE CARDIAC REHAB STAFF    Exercise Intensity or MET Level TO BE DETERMINED BY CARDIAC REHAB STAFF      ECG 12 LEAD    S/P CARDIAC SURGERY    SCHEDULE WITH FOLLOW-UP IN CLINIC           CARDIAC REHAB PHASE II    Individualized education will be given based on the patient's diagnosis and needs.    Therapist/Nurse may place secondary order for Daily Documentation orders if needed.            Indication for Therapy S/P CABG    Method to Determine THR for Program Duration TO BE DETERMINED BY THE CARDIAC REHAB STAFF    Exercise Intensity or MET Level TO BE DETERMINED BY CARDIAC REHAB STAFF      FOLLOW-UP: [facility] - THORACIC SURGERY - MEDICAL OFFICE BLDG - [place], [place]    With CXR and EKG prior   [facility] [place] [phone]     Reason for visit: POST-OP VISIT    Follow-up in: 3 WEEKS    Provider: dr. Skipper      DME - [address] [place]; Walker Accessories: None    Please note - If patient is 300 lbs or greater please order bariatric or heavy duty items.      Patient has mobility limits that significantly impairs ability to participate in one or more mobility related ADL's (MRADL's): Yes    Moblity Limitations: Pt at heightened risk of injury r/t attempts to fulfill MRADL's & can safely use walker which resolves issue    Walker Type: Front Wheeled    Walker Accessories: None    Freedom of Choice: I have informed patient of  their freedom of choice with respect to DME providers      DME - BEDSIDE COMMODE    A standard commode is covered when the patient is physically incapable of utilizing regular toilet facilities.  An extra wide/heavy duty commode chair is covered for a patient who weighs 300 pounds or more.     I certify that a bedside commode is necessary due to Patient is confined to one level of home and there is no toilet on that level    Bedside Commode Type Standard Bedside Commode    Freedom of Choice: I have informed patient of their freedom of choice with respect to DME providers           Duwaine Devonshire, APRN,AGACNP-BC    Copies sent to Care Team         Relationship Specialty Notifications Start End    Ladora Lines, DO PCP - General FAMILY MEDICINE  09/03/22     Phone: [phone] Fax: [phone]         [address] [place] [address]            Referring providers can utilize [email or web address] to access their referred [facility] patient's information.                               [1]   Allergies  Allergen Reactions    Cashew [Tree Nuts] Rash and Itching    Tetracyclines Rash and Itching    Bee Venom Protein (Honey Bee)

## 2024-02-29 NOTE — Care Management Notes (Signed)
PT already discharged, mailing out IM ltr

## 2024-03-08 ENCOUNTER — Encounter (HOSPITAL_BASED_OUTPATIENT_CLINIC_OR_DEPARTMENT_OTHER): Payer: Self-pay

## 2024-03-08 ENCOUNTER — Other Ambulatory Visit (HOSPITAL_BASED_OUTPATIENT_CLINIC_OR_DEPARTMENT_OTHER): Payer: Self-pay

## 2024-03-08 ENCOUNTER — Telehealth (INDEPENDENT_AMBULATORY_CARE_PROVIDER_SITE_OTHER): Payer: Self-pay

## 2024-03-08 ENCOUNTER — Ambulatory Visit (HOSPITAL_BASED_OUTPATIENT_CLINIC_OR_DEPARTMENT_OTHER): Payer: Self-pay | Admitting: VASCULAR SURGERY

## 2024-03-08 NOTE — Telephone Encounter (Signed)
 Pt refused referral for outpatient cardiac rehab.  I offered to send his information to [facility] which is the closest hospital according to him but he declined.

## 2024-03-21 ENCOUNTER — Ambulatory Visit (HOSPITAL_BASED_OUTPATIENT_CLINIC_OR_DEPARTMENT_OTHER): Payer: Self-pay | Admitting: PODIATRIST-GENERAL PRACTICE

## 2024-03-22 ENCOUNTER — Ambulatory Visit: Payer: MEDICARE | Attending: FAMILY PRACTICE

## 2024-03-22 ENCOUNTER — Ambulatory Visit (HOSPITAL_BASED_OUTPATIENT_CLINIC_OR_DEPARTMENT_OTHER)
Admission: RE | Admit: 2024-03-22 | Discharge: 2024-03-22 | Disposition: A | Discharge status: Home / Self Care | Payer: MEDICARE | Source: Ambulatory Visit

## 2024-03-22 ENCOUNTER — Other Ambulatory Visit: Payer: Self-pay

## 2024-03-22 ENCOUNTER — Ambulatory Visit (HOSPITAL_BASED_OUTPATIENT_CLINIC_OR_DEPARTMENT_OTHER): Payer: MEDICARE | Admitting: VASCULAR SURGERY

## 2024-03-22 ENCOUNTER — Encounter (HOSPITAL_BASED_OUTPATIENT_CLINIC_OR_DEPARTMENT_OTHER): Payer: Self-pay | Admitting: VASCULAR SURGERY

## 2024-03-22 ENCOUNTER — Ambulatory Visit (HOSPITAL_COMMUNITY)
Admission: RE | Admit: 2024-03-22 | Discharge: 2024-03-22 | Disposition: A | Discharge status: Home / Self Care | Payer: MEDICARE | Source: Ambulatory Visit

## 2024-03-22 ENCOUNTER — Encounter (HOSPITAL_BASED_OUTPATIENT_CLINIC_OR_DEPARTMENT_OTHER): Payer: Self-pay

## 2024-03-22 ENCOUNTER — Ambulatory Visit (HOSPITAL_BASED_OUTPATIENT_CLINIC_OR_DEPARTMENT_OTHER)
Admission: RE | Admit: 2024-03-22 | Discharge: 2024-03-22 | Disposition: A | Discharge status: Home / Self Care | Payer: MEDICARE | Source: Ambulatory Visit | Attending: PHYSICIAN ASSISTANT | Admitting: PHYSICIAN ASSISTANT

## 2024-03-22 VITALS — BP 117/71 | HR 72 | Ht 72.0 in | Wt 199.0 lb

## 2024-03-22 DIAGNOSIS — Z9889 Other specified postprocedural states: Secondary | ICD-10-CM | POA: Insufficient documentation

## 2024-03-22 DIAGNOSIS — J90 Pleural effusion, not elsewhere classified: Secondary | ICD-10-CM

## 2024-03-22 DIAGNOSIS — I739 Peripheral vascular disease, unspecified: Secondary | ICD-10-CM

## 2024-03-22 DIAGNOSIS — R9431 Abnormal electrocardiogram [ECG] [EKG]: Secondary | ICD-10-CM

## 2024-03-22 DIAGNOSIS — Z951 Presence of aortocoronary bypass graft: Secondary | ICD-10-CM

## 2024-03-22 DIAGNOSIS — J9811 Atelectasis: Secondary | ICD-10-CM

## 2024-03-22 LAB — ECG 12 LEAD
Atrial Rate: 72 {beats}/min
Calculated P Axis: 46 degrees
Calculated R Axis: 31 degrees
Calculated T Axis: 136 degrees
PR Interval: 186 ms
QRS Duration: 86 ms
QT Interval: 370 ms
QTC Calculation: 405 ms
Ventricular rate: 72 {beats}/min
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
## 2024-03-22 NOTE — Progress Notes (Signed)
 Vascular Surgery  Follow-Up Clinic Note                    Date: 03/22/2024  Patient: Jeremy Sexton   MRN: [id number]   DOB: 1955-10-08   PCP: Earnest Daniels, DO    Chief Complaint:   No chief complaint on file.      HPI: IFEANYICHUKWU Sexton is a 68 y.o.  male known to Dr. Gwenda who presents for follow up of PAD and nonhealing wound to right foot. He has a past medical history of hypertension, diabetes, hyperlipidemia, and CAD.     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 Steen (General Surgery Physician in [place]).  Upon last visit in May of 2025, patient presented as a new patient for further evaluation of arterial flow.  Patient is status post right AIF angiogram with balloon angioplasty of the right AT on 01/19/2024 with Dr. Gwenda.  Patient is also now status post CABG with Dr. Skipper on 02/23/2024.    Upon speaking with the patient today, he states he has done very well.  He is feeling well.  States the wound to his right foot is slowly healing.  He continues to follow with Dr. Conard with Podiatry.  Denies any claudication symptoms, rest pain.  Since states he is also recovering well from his CABG.  Denies any other acute complaints.  He remains on aspirin , Plavix  and statin therapy with Lipitor.    Denies any new changes in PMHx, PSxH, Social Hx, FHx or medications since last visit.    ROS:  Pertinent positives and negatives as stated in the HPI.    Allergies[1]    Current Outpatient Medications   Medication Sig    [Paused] amLODIPine  (NORVASC) 10 mg Oral Tablet Take 1 Tablet (10 mg total) by mouth Every morning (Patient not taking: Reported on 03/22/2024)    aspirin  (ECOTRIN) 81 mg Oral Tablet,  Delayed Release (E.C.) Take 1 Tablet (81 mg total) by mouth Every morning    atorvastatin  (LIPITOR) 40 mg Oral Tablet Take 1 Tablet (40 mg total) by mouth Every evening    buPROPion  (WELLBUTRIN ) 75 mg Oral Tablet Take 1 Tablet (75 mg total) by mouth Twice daily AM & 1200    [Paused] carvediloL  (COREG ) 25 mg Oral Tablet Take 2 Tablets (50 mg total) by mouth Twice daily with food (Patient not taking: Reported on 03/22/2024)    [Paused] cloNIDine  HCL (CATAPRES ) 0.1 mg Oral Tablet Take 1 Tablet (0.1 mg total) by mouth Twice daily (Patient not taking: Reported on 03/22/2024)    clopidogreL  (PLAVIX ) 75 mg Oral Tablet Take 1 Tablet (75 mg total) by mouth Daily    famotidine  (PEPCID ) 40 mg Oral Tablet Take 1 Tablet (40 mg total) by mouth Twice daily    furosemide  (LASIX ) 20 mg Oral Tablet Take 1 Tablet (20 mg total) by mouth Every morning    gabapentin  (NEURONTIN ) 400 mg Oral Capsule Take 1 Capsule (400 mg total) by mouth Three times a day    HYDROcodone -acetaminophen  (NORCO) 5-325 mg Oral Tablet Take 1 Tablet by mouth Every 6 hours as needed for Pain for up to 3 days    Ibuprofen (MOTRIN) 200 mg Oral Tablet  Take 3 Tablets (600 mg total) by mouth Four times a day as needed for Pain    [Paused] lisinopriL  (PRINIVIL ) 20 mg Oral Tablet Take 1 Tablet (20 mg total) by mouth Daily (Patient not taking: Reported on 03/22/2024)    metFORMIN  (GLUCOPHAGE ) 500 mg Oral Tablet Take 1 Tablet (500 mg total) by mouth Twice daily with food    metoprolol  tartrate (LOPRESSOR ) 25 mg Oral Tablet Take 1 Tablet (25 mg total) by mouth Every 12 hours    potassium chloride  (K-DUR) 10 mEq Oral Tab Sust.Rel. Particle/Crystal Take 1 Tablet (10 mEq total) by mouth Every morning    tamsulosin  (FLOMAX ) 0.4 mg Oral Capsule Take 1 Capsule (0.4 mg total) by mouth Every evening after dinner for 30 days    traZODone  (DESYREL ) 50 mg Oral Tablet Take 1 Tablet (50 mg total) by mouth Every night       Physical Exam:   There were no vitals filed for this visit.    Constitutional: AA&O X3 in no acute distress   Neck:  Supple, symmetrical  Respiratory: Effort normal, clear to auscultation bilaterally.   Cardiovascular: Heart regular rate and rhythm.  Extremities:  Multiple toes amputated to the right foot, wound covered in dressing to the right foot    Testing:   03/22/2024:  Peripheral artery duplex   1. 50-69% stenosis in proximal right posterior tibial artery, mid and distal right anterior tibial artery.   2. Intermittent flow in mid/distal segment of right anterior tibial artery.  03/22/2024: ABIs  1. Essentially normal resting arterial perfusion to bilateral lower extremities    Assessment and Plan    ICD-10-CM    1. PAD (peripheral artery disease) (CMS HCC)  I73.9 ANKLE BRACHIAL INDICES   -status post right AIF angiogram with balloon angioplasty of the right AT on 01/19/2024 with Dr. Gwenda  -has nonhealing wound to right foot.  Slowly improving.  Continues to follow with Podiatry.  No claudication symptoms, rest pain  -peripheral arterial duplex and ABIs obtained and results as above.  Improved when compared to prior.  -continue aspirin , Plavix  and statin therapy  -continue follow up in wound care with Podiatry  -follow up in 6 months with repeat ABIs  -patient educated to call our office with any questions, concerns or new/worsening symptoms       Patient was given the opportunity to ask questions and those questions were answered to their satisfaction. Instructed to call with any further questions or concerns.  Patient was seen and evaluated with Dr. Gwenda Lauraine Brands, PA-C    VASCULAR ATTENDING: The patient was seen by me and examined as part of a shared service with the APP. The patient was clinically evaluated, and all pertinent lab results and imaging were reviewed. I reviewed the APP note and made any substantive changes necessary. I agree with the APP note assessment and plan.    Garnette Gwenda, MD     This note was partially generated using M[employer] Fluency  Direct system, and there may be some incorrect words, spellings, and punctuation that were not noted in checking the note before saving, though effort was made to avoid such errors.         [1]   Allergies  Allergen Reactions    Cashew [Tree Nuts] Rash and Itching    Tetracyclines Rash and Itching    Bee Venom Protein (Honey Bee)

## 2024-03-24 DIAGNOSIS — I739 Peripheral vascular disease, unspecified: Secondary | ICD-10-CM

## 2024-03-26 ENCOUNTER — Other Ambulatory Visit (HOSPITAL_BASED_OUTPATIENT_CLINIC_OR_DEPARTMENT_OTHER): Payer: Self-pay

## 2024-03-29 ENCOUNTER — Other Ambulatory Visit: Payer: Self-pay

## 2024-03-29 ENCOUNTER — Ambulatory Visit
Admission: RE | Admit: 2024-03-29 | Discharge: 2024-03-29 | Disposition: A | Discharge status: Home / Self Care | Payer: MEDICARE | Source: Ambulatory Visit

## 2024-03-29 DIAGNOSIS — J90 Pleural effusion, not elsewhere classified: Secondary | ICD-10-CM | POA: Insufficient documentation

## 2024-03-29 DIAGNOSIS — Z951 Presence of aortocoronary bypass graft: Secondary | ICD-10-CM | POA: Insufficient documentation

## 2024-03-30 NOTE — Telephone Encounter (Signed)
 No longer needed

## 2024-05-30 ENCOUNTER — Encounter (HOSPITAL_COMMUNITY): Payer: Self-pay

## 2024-06-14 ENCOUNTER — Telehealth (HOSPITAL_COMMUNITY): Payer: Self-pay | Admitting: THORACIC SURGERY CARDIOTHORACIC VASCULAR SURGERY

## 2024-06-14 NOTE — Telephone Encounter (Signed)
 Called and spoke with patient today for an STS quality review follow up after cardiac surgery.  His cardiac surgery incisions are healed. He was not readmitted within 30 days of his CABG however on 9/17 when his foot turned black he went to [facility] and was transferred to [facility] where they amputated his foot, then went back the next day and amputated below the knee midcalf. All of that is healing well. Consuelo Kelly Clarity, RN

## 2024-09-22 ENCOUNTER — Ambulatory Visit (HOSPITAL_BASED_OUTPATIENT_CLINIC_OR_DEPARTMENT_OTHER): Payer: Self-pay

## 2024-09-22 ENCOUNTER — Encounter (HOSPITAL_BASED_OUTPATIENT_CLINIC_OR_DEPARTMENT_OTHER): Payer: Self-pay
